# Patient Record
Sex: Male | Born: 1975 | Race: Black or African American | Hispanic: No | Marital: Single | Smoking: Current every day smoker
Health system: Southern US, Community
[De-identification: ages and names within clinical notes are randomized; demographics above are authoritative.]

## PROBLEM LIST (undated history)

## (undated) DIAGNOSIS — IMO0002 Reserved for concepts with insufficient information to code with codable children: Secondary | ICD-10-CM

## (undated) DIAGNOSIS — I10 Essential (primary) hypertension: Secondary | ICD-10-CM

## (undated) DIAGNOSIS — F419 Anxiety disorder, unspecified: Secondary | ICD-10-CM

## (undated) DIAGNOSIS — M329 Systemic lupus erythematosus, unspecified: Secondary | ICD-10-CM

## (undated) DIAGNOSIS — D649 Anemia, unspecified: Secondary | ICD-10-CM

## (undated) DIAGNOSIS — N289 Disorder of kidney and ureter, unspecified: Secondary | ICD-10-CM

## (undated) DIAGNOSIS — Z9289 Personal history of other medical treatment: Secondary | ICD-10-CM

## (undated) HISTORY — PX: AV FISTULA PLACEMENT: SHX1204

## (undated) HISTORY — PX: PERITONEAL CATHETER INSERTION: SHX2223

---

## 2018-01-18 DIAGNOSIS — I509 Heart failure, unspecified: Secondary | ICD-10-CM | POA: Insufficient documentation

## 2018-04-15 DIAGNOSIS — R519 Headache, unspecified: Secondary | ICD-10-CM | POA: Insufficient documentation

## 2019-09-01 ENCOUNTER — Emergency Department (HOSPITAL_COMMUNITY): Payer: Medicare HMO

## 2019-09-01 ENCOUNTER — Other Ambulatory Visit: Payer: Self-pay

## 2019-09-01 ENCOUNTER — Observation Stay (HOSPITAL_COMMUNITY)
Admission: EM | Admit: 2019-09-01 | Discharge: 2019-09-03 | Disposition: A | Discharge status: Home / Self Care | Payer: Medicare HMO | Attending: Internal Medicine | Admitting: Internal Medicine

## 2019-09-01 DIAGNOSIS — I132 Hypertensive heart and chronic kidney disease with heart failure and with stage 5 chronic kidney disease, or end stage renal disease: Secondary | ICD-10-CM | POA: Insufficient documentation

## 2019-09-01 DIAGNOSIS — Z20822 Contact with and (suspected) exposure to covid-19: Secondary | ICD-10-CM | POA: Insufficient documentation

## 2019-09-01 DIAGNOSIS — D649 Anemia, unspecified: Secondary | ICD-10-CM

## 2019-09-01 DIAGNOSIS — Z992 Dependence on renal dialysis: Secondary | ICD-10-CM | POA: Diagnosis not present

## 2019-09-01 DIAGNOSIS — I160 Hypertensive urgency: Secondary | ICD-10-CM | POA: Diagnosis not present

## 2019-09-01 DIAGNOSIS — R040 Epistaxis: Secondary | ICD-10-CM

## 2019-09-01 DIAGNOSIS — D631 Anemia in chronic kidney disease: Secondary | ICD-10-CM | POA: Diagnosis not present

## 2019-09-01 DIAGNOSIS — N186 End stage renal disease: Secondary | ICD-10-CM

## 2019-09-01 DIAGNOSIS — R4182 Altered mental status, unspecified: Secondary | ICD-10-CM | POA: Insufficient documentation

## 2019-09-01 DIAGNOSIS — I509 Heart failure, unspecified: Secondary | ICD-10-CM | POA: Insufficient documentation

## 2019-09-01 DIAGNOSIS — R112 Nausea with vomiting, unspecified: Principal | ICD-10-CM

## 2019-09-01 LAB — CBC WITH DIFFERENTIAL/PLATELET
Abs Immature Granulocytes: 0.06 10*3/uL (ref 0.00–0.07)
Basophils Absolute: 0 10*3/uL (ref 0.0–0.1)
Basophils Relative: 0 %
Eosinophils Absolute: 0 10*3/uL (ref 0.0–0.5)
Eosinophils Relative: 1 %
HCT: 13.3 % — ABNORMAL LOW (ref 39.0–52.0)
Hemoglobin: 4.2 g/dL — CL (ref 13.0–17.0)
Immature Granulocytes: 1 %
Lymphocytes Relative: 15 %
Lymphs Abs: 1.1 10*3/uL (ref 0.7–4.0)
MCH: 26.6 pg (ref 26.0–34.0)
MCHC: 31.6 g/dL (ref 30.0–36.0)
MCV: 84.2 fL (ref 80.0–100.0)
Monocytes Absolute: 0.5 10*3/uL (ref 0.1–1.0)
Monocytes Relative: 7 %
Neutro Abs: 5.8 10*3/uL (ref 1.7–7.7)
Neutrophils Relative %: 76 %
Platelets: 212 10*3/uL (ref 150–400)
RBC: 1.58 MIL/uL — ABNORMAL LOW (ref 4.22–5.81)
RDW: 16.1 % — ABNORMAL HIGH (ref 11.5–15.5)
WBC: 7.6 10*3/uL (ref 4.0–10.5)
nRBC: 0 % (ref 0.0–0.2)

## 2019-09-01 LAB — COMPREHENSIVE METABOLIC PANEL
ALT: 14 U/L (ref 0–44)
AST: 12 U/L — ABNORMAL LOW (ref 15–41)
Albumin: 2.5 g/dL — ABNORMAL LOW (ref 3.5–5.0)
Alkaline Phosphatase: 54 U/L (ref 38–126)
Anion gap: 18 — ABNORMAL HIGH (ref 5–15)
BUN: 81 mg/dL — ABNORMAL HIGH (ref 6–20)
CO2: 22 mmol/L (ref 22–32)
Calcium: 8 mg/dL — ABNORMAL LOW (ref 8.9–10.3)
Chloride: 100 mmol/L (ref 98–111)
Creatinine, Ser: 21.09 mg/dL — ABNORMAL HIGH (ref 0.61–1.24)
GFR calc Af Amer: 3 mL/min — ABNORMAL LOW (ref >60)
GFR calc non Af Amer: 2 mL/min — ABNORMAL LOW (ref >60)
Glucose, Bld: 88 mg/dL (ref 70–99)
Potassium: 3.9 mmol/L (ref 3.5–5.1)
Sodium: 140 mmol/L (ref 135–145)
Total Bilirubin: 0.5 mg/dL (ref 0.3–1.2)
Total Protein: 6.5 g/dL (ref 6.5–8.1)

## 2019-09-01 LAB — POC OCCULT BLOOD, ED: Fecal Occult Bld: NEGATIVE

## 2019-09-01 LAB — RETICULOCYTES
Immature Retic Fract: 7 % (ref 2.3–15.9)
RBC.: 3.12 MIL/uL — ABNORMAL LOW (ref 4.22–5.81)
Retic Count, Absolute: 49 10*3/uL (ref 19.0–186.0)
Retic Ct Pct: 1.6 % (ref 0.4–3.1)

## 2019-09-01 LAB — MAGNESIUM: Magnesium: 1.7 mg/dL (ref 1.7–2.4)

## 2019-09-01 LAB — LIPASE, BLOOD: Lipase: 37 U/L (ref 11–51)

## 2019-09-01 LAB — AMMONIA: Ammonia: 39 umol/L — ABNORMAL HIGH (ref 9–35)

## 2019-09-01 LAB — ABO/RH: ABO/RH(D): B POS

## 2019-09-01 LAB — SARS CORONAVIRUS 2 BY RT PCR (HOSPITAL ORDER, PERFORMED IN ~~LOC~~ HOSPITAL LAB): SARS Coronavirus 2: NEGATIVE

## 2019-09-01 LAB — PREPARE RBC (CROSSMATCH)

## 2019-09-01 MED ORDER — ACETAMINOPHEN 650 MG RE SUPP: 650.0000 mg | Freq: Four times a day (QID) | RECTAL | Status: DC | PRN

## 2019-09-01 MED ORDER — FENTANYL CITRATE (PF) 100 MCG/2ML IJ SOLN
50.0000 ug | Freq: Once | INTRAMUSCULAR | Status: AC
  Administered 2019-09-01: 50 ug via INTRAVENOUS
  Filled 2019-09-01: qty 2

## 2019-09-01 MED ORDER — PANTOPRAZOLE SODIUM 40 MG PO TBEC
40.0000 mg | DELAYED_RELEASE_TABLET | Freq: Every day | ORAL | Status: DC
  Administered 2019-09-02 – 2019-09-03 (×2): 40 mg via ORAL
  Filled 2019-09-01 (×2): qty 1

## 2019-09-01 MED ORDER — SODIUM CHLORIDE 0.9 % IV SOLN: 10.0000 mL/h | Freq: Once | INTRAVENOUS | Status: DC

## 2019-09-01 MED ORDER — ACETAMINOPHEN 325 MG PO TABS: 650.0000 mg | ORAL_TABLET | Freq: Four times a day (QID) | ORAL | Status: DC | PRN

## 2019-09-01 MED ORDER — CARVEDILOL 12.5 MG PO TABS
12.5000 mg | ORAL_TABLET | Freq: Two times a day (BID) | ORAL | Status: DC
  Administered 2019-09-02 – 2019-09-03 (×4): 12.5 mg via ORAL
  Filled 2019-09-01 (×3): qty 1
  Filled 2019-09-01: qty 4
  Filled 2019-09-01: qty 1

## 2019-09-01 MED ORDER — AMLODIPINE BESYLATE 5 MG PO TABS
5.0000 mg | ORAL_TABLET | Freq: Every day | ORAL | Status: DC
  Administered 2019-09-02 – 2019-09-03 (×3): 5 mg via ORAL
  Filled 2019-09-01 (×3): qty 1

## 2019-09-01 MED ORDER — ONDANSETRON 4 MG PO TBDP
4.0000 mg | ORAL_TABLET | Freq: Once | ORAL | Status: AC
  Administered 2019-09-01: 4 mg via ORAL
  Filled 2019-09-01: qty 1

## 2019-09-01 MED ORDER — CLONIDINE HCL 0.1 MG PO TABS
0.1000 mg | ORAL_TABLET | Freq: Two times a day (BID) | ORAL | Status: DC
  Administered 2019-09-02 – 2019-09-03 (×4): 0.1 mg via ORAL
  Filled 2019-09-01 (×4): qty 1

## 2019-09-01 MED ORDER — FERRIC CITRATE 1 GM 210 MG(FE) PO TABS
210.0000 mg | ORAL_TABLET | Freq: Three times a day (TID) | ORAL | Status: DC
  Administered 2019-09-02 – 2019-09-03 (×4): 210 mg via ORAL
  Filled 2019-09-01 (×5): qty 1

## 2019-09-01 MED ORDER — CALCITRIOL 0.5 MCG PO CAPS
1.5000 ug | ORAL_CAPSULE | Freq: Every day | ORAL | Status: DC
  Administered 2019-09-02 – 2019-09-03 (×2): 1.5 ug via ORAL
  Filled 2019-09-01 (×2): qty 3

## 2019-09-01 MED ORDER — ONDANSETRON HCL 4 MG/2ML IJ SOLN
4.0000 mg | Freq: Once | INTRAMUSCULAR | Status: AC
  Administered 2019-09-01: 4 mg via INTRAVENOUS
  Filled 2019-09-01: qty 2

## 2019-09-01 MED ORDER — HYDRALAZINE HCL 20 MG/ML IJ SOLN: 5.0000 mg | INTRAMUSCULAR | Status: DC | PRN

## 2019-09-01 MED ORDER — HEPARIN SODIUM (PORCINE) 5000 UNIT/ML IJ SOLN
5000.0000 [IU] | Freq: Three times a day (TID) | INTRAMUSCULAR | Status: DC
  Administered 2019-09-02: 5000 [IU] via SUBCUTANEOUS

## 2019-09-01 NOTE — H&P (Signed)
History and Physical    Nathan Castaneda YBW:[id number] DOB: [birth year] DOA: 09/01/2019  PCP: Patient, No Pcp Per Patient coming from: Home  Chief Complaint: Emesis, abdominal pain, epistaxis  HPI: Nathan Castaneda is a 44 y.o. male with medical history significant of ESRD on peritoneal dialysis, chronic anemia, hypertension, CHF, GERD presenting to the ED with complaints of emesis, abdominal pain, and epistaxis.  Patient states this morning he started having abdominal cramps.  This afternoon he ate lunch and soon after vomited what he had eaten and started having a nosebleed which lasted about 3 hours until he reached the ED.  States his abdominal cramps have now resolved and he was able to eat food in the ED. Denies prior history of nosebleeds.  Denies history of any trauma to his nose or face.  He is not on any blood thinners.  He has not been vaccinated against Covid.  Denies cough, shortness of breath, chest pain, palpitations, or lightheadedness/dizziness.  States he has not been able to take his blood pressure medications today since he has been in the ED.  States his blood pressure usually runs high and his doctor has tried adjusting the dose of his medications several times.  ED Course: Blood pressure significantly elevated with systolic above 768.  Remainder of vital signs stable.  Labs showing significant anemia with hemoglobin 4.2.  Per review of care everywhere, baseline hemoglobin in the 9-10 range.  FOBT negative.  Anemia panel pending.  Potassium 3.9, bicarb 22.  No elevation of lipase or LFTs.  Ammonia mildly elevated at 39.  SARS-CoV-2 PCR test pending.  Head CT negative for acute intracranial abnormality.  Review of Systems:  All systems reviewed and apart from history of presenting illness, are negative.  Past medical history: Listed above in HPI.  Past surgical history: Right upper extremity fistula and left upper extremity AV graft.  Social history: Smokes 1/3 pack of cigarettes  daily.  Denies ethanol or drug use.   Family history: Both mother and father with diabetes.  Allergies: Vancomycin  Prior to Admission medications   Not on File    Physical Exam: Vitals:   09/01/19 1712 09/01/19 1716 09/01/19 1721 09/01/19 2234  BP:  (!) 218/101  (!) 197/96  Pulse:  70  66  Resp:  (!) 21  18  Temp:  98.4 F (36.9 C)  98.4 F (36.9 C)  TempSrc:    Oral  SpO2: 98% 91% 98% 99%    Physical Exam Constitutional:      General: He is not in acute distress. HENT:     Head: Normocephalic and atraumatic.  Eyes:     Extraocular Movements: Extraocular movements intact.     Conjunctiva/sclera: Conjunctivae normal.  Cardiovascular:     Rate and Rhythm: Normal rate and regular rhythm.     Pulses: Normal pulses.  Pulmonary:     Effort: Pulmonary effort is normal. No respiratory distress.     Breath sounds: Normal breath sounds. No wheezing or rales.  Abdominal:     General: Bowel sounds are normal. There is no distension.     Palpations: Abdomen is soft.     Tenderness: There is no abdominal tenderness. There is no guarding or rebound.  Musculoskeletal:        General: No swelling or tenderness.     Cervical back: Normal range of motion and neck supple.  Skin:    General: Skin is warm and dry.  Neurological:     General: No focal  deficit present.     Mental Status: He is alert and oriented to person, place, and time.     Labs on Admission: I have personally reviewed following labs and imaging studies  CBC: Recent Labs  Lab 09/01/19 2022  WBC 7.6  NEUTROABS 5.8  HGB 4.2*  HCT 13.3*  MCV 84.2  PLT 751   Basic Metabolic Panel: Recent Labs  Lab 09/01/19 2022  NA 140  K 3.9  CL 100  CO2 22  GLUCOSE 88  BUN 81*  CREATININE 21.09*  CALCIUM 8.0*  MG 1.7   GFR: CrCl cannot be calculated (Unknown ideal weight.). Liver Function Tests: Recent Labs  Lab 09/01/19 2022  AST 12*  ALT 14  ALKPHOS 54  BILITOT 0.5  PROT 6.5  ALBUMIN 2.5*    Recent Labs  Lab 09/01/19 2022  LIPASE 37   Recent Labs  Lab 09/01/19 2022  AMMONIA 39*   Coagulation Profile: No results for input(s): INR, PROTIME in the last 168 hours. Cardiac Enzymes: No results for input(s): CKTOTAL, CKMB, CKMBINDEX, TROPONINI in the last 168 hours. BNP (last 3 results) No results for input(s): PROBNP in the last 8760 hours. HbA1C: No results for input(s): HGBA1C in the last 72 hours. CBG: No results for input(s): GLUCAP in the last 168 hours. Lipid Profile: No results for input(s): CHOL, HDL, LDLCALC, TRIG, CHOLHDL, LDLDIRECT in the last 72 hours. Thyroid Function Tests: No results for input(s): TSH, T4TOTAL, FREET4, T3FREE, THYROIDAB in the last 72 hours. Anemia Panel: Recent Labs    09/01/19 2157  RETICCTPCT 1.6   Urine analysis: No results found for: COLORURINE, APPEARANCEUR, LABSPEC, [place], GLUCOSEU, HGBUR, BILIRUBINUR, KETONESUR, PROTEINUR, UROBILINOGEN, NITRITE, LEUKOCYTESUR  Radiological Exams on Admission: CT Head Wo Contrast  Result Date: 09/01/2019 CLINICAL DATA:  Mental status change. Alcohol and drug use suspected. EXAM: CT HEAD WITHOUT CONTRAST TECHNIQUE: Contiguous axial images were obtained from the base of the skull through the vertex without intravenous contrast. COMPARISON:  None. FINDINGS: Brain: No intracranial hemorrhage, mass effect, or midline shift. No hydrocephalus. The basilar cisterns are patent. No evidence of territorial infarct or acute ischemia. No extra-axial or intracranial fluid collection. Vascular: No hyperdense vessel or unexpected calcification. Skull: No fracture or focal lesion. Sinuses/Orbits: Mild mucosal thickening without sinus fluid level. Mastoid air cells are clear. Slight dysconjugate gaze, typically incidental. Other: None. IMPRESSION: No acute intracranial abnormality. Electronically Signed   By: Keith Rake M.D.   On: 09/01/2019 21:08    EKG: Independently reviewed.  Sinus rhythm, LVH.  No  prior tracing for comparison.  Assessment/Plan Principal Problem:   Acute on chronic anemia Active Problems:   ESRD (end stage renal disease) (HCC)   Hypertensive urgency   Nausea & vomiting   Epistaxis   Acute on chronic anemia: Likely due to end-stage renal disease.  Also had an episode of epistaxis today.  Hemoglobin 4.2.  Per review of care everywhere, baseline hemoglobin in the 9-10 range.  FOBT negative.   -Anemia panel pending.  Type and screen.  Patient does not appear volume overloaded on exam, 2 units PRBCs ordered. Please consult nephrology in the morning.  ESRD on peritoneal dialysis: Presenting with significant anemia.  Blood pressure elevated but no signs of volume overload on exam.  Lungs clear to auscultation. Potassium 3.9, bicarb 22.  No indication for urgent dialysis at this time. -Resume home renal supplements.  Please consult nephrology in the morning.  Hypertensive urgency: Blood pressure significantly elevated in the ED with systolic  above 200. -Resume home amlodipine, Coreg, and clonidine.  IV hydralazine PRN SBP >170.  Nausea, emesis, abdominal pain: Symptoms have now resolved and patient able to tolerate p.o. intake.  Abdominal exam benign.  No fever or leukocytosis.  No elevation of lipase or LFTs. -Continue to monitor, antiemetic as needed  Epistaxis: He had an episode of epistaxis today which has now resolved.  No active bleeding at this time. -Continue to monitor closely  GERD -Resume Protonix  CHF: Does not appear volume overloaded on exam and lungs clear to auscultation. -Dialysis per nephrology  DVT prophylaxis: SCDs at this time given recent epistaxis Code Status: Full code Family Communication: No family available at this time. Disposition Plan: Status is: Observation  The patient remains OBS appropriate and will d/c before 2 midnights.  Dispo: The patient is from: Home              Anticipated d/c is to: Home              Anticipated d/c  date is: 1 day              Patient currently is not medically stable to d/c.  The medical decision making on this patient was of high complexity and the patient is at high risk for clinical deterioration, therefore this is a level 3 visit.  Shela Leff MD [facility]  If 7PM-7AM, please contact night-coverage [email or web address]  09/02/2019, 12:09 AM

## 2019-09-01 NOTE — ED Provider Notes (Signed)
[place] EMERGENCY DEPARTMENT Provider Note   CSN: [id number] Arrival date & time: 09/01/19  1703     History Chief Complaint  Patient presents with  . Emesis  . Abdominal Pain    Nathan Castaneda is a 44 y.o. male.  HPI   Patient with a significant medical history of hypertension, lupus, end-stage renal disease on peritoneal dialysis presents to the emergency department with chief complaint of nausea, vomiting, stomach cramps that started this morning.  Patient explains when he woke up he started to vomit and have severe cramping pain along his right lower abdomen.  He denies seeing blood in his vomit, fever, chills, diarrhea, dysuria.  He has never experienced this before, denies any alleviating or aggravating factors.  Patient is on peritoneal dialysis performs daily for the last 7 years, performed last treatment yesterday but was unable to complete it.  He is currently not Covid vaccine and denies any recent sick contacts.  He denies headache, fever, chills, shortness of breath, chest pain, diarrhea, dysuria, pedal edema.  No past medical history on file.  Patient Active Problem List   Diagnosis Date Noted  . Severe anemia 09/01/2019         No family history on file.  Social History   Tobacco Use  . Smoking status: Not on file  Substance Use Topics  . Alcohol use: Not on file  . Drug use: Not on file    Home Medications Prior to Admission medications   Not on File    Allergies    Patient has no allergy information on record.  Review of Systems   Review of Systems  Constitutional: Negative for chills and fever.  HENT: Negative for congestion, sneezing and sore throat.   Eyes: Negative for visual disturbance.  Respiratory: Negative for cough and shortness of breath.   Cardiovascular: Negative for chest pain.  Gastrointestinal: Positive for abdominal pain, nausea and vomiting. Negative for diarrhea.  Genitourinary: Negative for enuresis,  flank pain, frequency and scrotal swelling.  Musculoskeletal: Negative for back pain.  Skin: Negative for rash.  Neurological: Negative for dizziness and headaches.  Hematological: Does not bruise/bleed easily.    Physical Exam Updated Vital Signs BP (!) 197/96 (BP Location: Left Leg)   Pulse 66   Temp 98.4 F (36.9 C) (Oral)   Resp 18   SpO2 99%   Physical Exam Vitals and nursing note reviewed.  Constitutional:      General: He is in acute distress.     Appearance: He is not ill-appearing.  HENT:     Head: Normocephalic and atraumatic.     Nose: No congestion.     Mouth/Throat:     Mouth: Mucous membranes are moist.     Pharynx: Oropharynx is clear.  Eyes:     General: No scleral icterus. Cardiovascular:     Rate and Rhythm: Normal rate and regular rhythm.     Pulses: Normal pulses.     Heart sounds: No murmur heard.  No friction rub. No gallop.   Pulmonary:     Effort: No respiratory distress.     Breath sounds: No wheezing, rhonchi or rales.  Abdominal:     General: There is no distension.     Palpations: Abdomen is soft.     Tenderness: There is abdominal tenderness. There is no right CVA tenderness, left CVA tenderness, guarding or rebound.     Comments: Patient is abdomen was visualized, there was peritoneal dialysis tube coming from  the right lower abdomen.  No erythema, edema or signs of infection noted.  Abdomen was nondistended, normoactive bowel sounds, dull to percussion, tender to palpation along right lower quadrant, negative McBurney point, negative Murphy sign, no rebound tenderness.  Musculoskeletal:        General: No swelling.     Right lower leg: No edema.     Left lower leg: No edema.     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  Right had good thrill and pulse.  No signs of infection seen.  Skin:    General: Skin is warm and dry.     Capillary Refill: Capillary refill takes less than 2 seconds.       Findings: No rash.  Neurological:     Mental Status: He is alert and oriented to person, place, and time.  Psychiatric:        Mood and Affect: Mood normal.     ED Results / Procedures / Treatments   Labs (all labs ordered are listed, but only abnormal results are displayed) Labs Reviewed  CBC WITH DIFFERENTIAL/PLATELET - Abnormal; Notable for the following components:      Result Value   RBC 1.58 (*)    Hemoglobin 4.2 (*)    HCT 13.3 (*)    RDW 16.1 (*)    All other components within normal limits  COMPREHENSIVE METABOLIC PANEL - Abnormal; Notable for the following components:   BUN 81 (*)    Creatinine, Ser 21.09 (*)    Calcium 8.0 (*)    Albumin 2.5 (*)    AST 12 (*)    GFR calc non Af Amer 2 (*)    GFR calc Af Amer 3 (*)    Anion gap 18 (*)    All other components within normal limits  AMMONIA - Abnormal; Notable for the following components:   Ammonia 39 (*)    All other components within normal limits  RETICULOCYTES - Abnormal; Notable for the following components:   RBC. 3.12 (*)    All other components within normal limits  SARS CORONAVIRUS 2 BY RT PCR (HOSPITAL ORDER, [facility])  LIPASE, BLOOD  MAGNESIUM  VITAMIN B12  IRON AND TIBC  FERRITIN  FOLATE  POC OCCULT BLOOD, ED  PREPARE RBC (CROSSMATCH)  TYPE AND SCREEN  ABO/RH    EKG EKG Interpretation  Date/Time:  Monday September 01 2019 18:59:46 EDT Ventricular Rate:  79 PR Interval:  186 QRS Duration: 96 QT Interval:  384 QTC Calculation: 440 R Axis:   78 Text Interpretation: Normal sinus rhythm Left ventricular hypertrophy with repolarization abnormality ( Sokolow-Lyon ) Abnormal ECG No STEMI Confirmed by Nanda Quinton [id number]) on 09/01/2019 8:13:47 PM   Radiology CT Head Wo Contrast  Result Date: 09/01/2019 CLINICAL DATA:  Mental status change. Alcohol and drug use suspected. EXAM: CT HEAD WITHOUT CONTRAST TECHNIQUE: Contiguous axial images were obtained from the base  of the skull through the vertex without intravenous contrast. COMPARISON:  None. FINDINGS: Brain: No intracranial hemorrhage, mass effect, or midline shift. No hydrocephalus. The basilar cisterns are patent. No evidence of territorial infarct or acute ischemia. No extra-axial or intracranial fluid collection. Vascular: No hyperdense vessel or unexpected calcification. Skull: No fracture or focal lesion. Sinuses/Orbits: Mild mucosal thickening without sinus fluid level. Mastoid air cells are clear. Slight dysconjugate gaze, typically incidental. Other: None. IMPRESSION: No acute intracranial abnormality. Electronically Signed  By: Keith Rake M.D.   On: 09/01/2019 21:08    Procedures .Critical Care Performed by: Marcello Fennel, PA-C Authorized by: Marcello Fennel, PA-C   Critical care provider statement:    Critical care time (minutes):  35   Critical care time was exclusive of:  Separately billable procedures and treating other patients   Critical care was necessary to treat or prevent imminent or life-threatening deterioration of the following conditions: Anemia providing blood transfusion.   Critical care was time spent personally by me on the following activities:  Discussions with consultants, evaluation of patient's response to treatment, examination of patient, ordering and performing treatments and interventions, ordering and review of laboratory studies, pulse oximetry, re-evaluation of patient's condition, obtaining history from patient or surrogate and review of old charts   I assumed direction of critical care for this patient from another provider in my specialty: no     (including critical care time)  Medications Ordered in ED Medications  0.9 %  sodium chloride infusion (has no administration in time range)  fentaNYL (SUBLIMAZE) injection 50 mcg (50 mcg Intravenous Given 09/01/19 2044)  ondansetron (ZOFRAN) injection 4 mg (4 mg Intravenous Given 09/01/19 2044)    ondansetron (ZOFRAN-ODT) disintegrating tablet 4 mg (4 mg Oral Given 09/01/19 2033)    ED Course  I have reviewed the triage vital signs and the nursing notes.  Pertinent labs & imaging results that were available during my care of the patient were reviewed by me and considered in my medical decision making (see chart for details).    MDM Rules/Calculators/A&P                           I have personally reviewed all imaging, labs and have interpreted them.  Patient presents to the emergency department with chief complaint of right lower abdominal pain, nausea, vomiting.  He seemed to be in some distress, he was alert and oriented, initial vitals show hypertension and tachypnea.  He was not in respiratory distress, no nasal flaring or retractions noted, lung sounds were clear bilaterally, patient had a fistula on the right antecubital fossa good thrill and pulse, abdomen was tender to palpation in the right lower quadrant, peritoneal dialysis tube was visualized no signs of infection noted, no pedal edema seen on exam.  Will order screening labs, provide antiemetics and pain medication and reevaluate.  19:23 RN notified me that unable to obtain IV access she has consulted IV team to come place a line and draw blood work.  Patient was reevaluated- asleep does not appear to be in acute distress. Will switch patient to oral Zofran.  IV team was able to place a line and obtain labs.  Initial labs reveal CBC normocytic anemia with a hemoglobin of 4.2.  No signs of leukocytosis.  CMP shows no electrolyte abnormalities, elevated bun which appears to be at baseline for patient.  Magnesium was 1.7, lipase was 37, ammonia was 39.  Hemoccult was negative.  Will order blood transfusion as well as anemia panel work-up.  CT head came back unremarkable.  Will consult hospitalist team for further evaluation management.  23:01 spoke with Dr. Logan Bores, of the hospitalist team who states she will come and evaluate  the patient and admit the patient for further evaluation.  She requested adding another unit of blood and she will contact nephrology tomorrow morning for a consult.  I have low suspicion for GI bleed or acute intra-abdominal  abnormality requiring surgical intervention as patient is tolerating p.o. without difficulty, he denies nausea, vomiting, abdomen was reassessed nontender to palpation no acute abdomen noted on exam.  I suspect patient's normocytic anemia  secondary to his chronic kidney disease, will further evaluate with a anemia study.  Low suspicion patient will require emergent dialysis as there is no severe electrolyte abnormalities, he was nonencephalopathic, BUN appears to be at baseline for patient.  Low suspicion for cardiac abnormality as patient denies chest pain, shortness of breath, no signs of hypoperfusion or fluid overload noted on exam.  Patient care will be turned over to hospitalist team for further evaluation and management. Final Clinical Impression(s) / ED Diagnoses Final diagnoses:  Anemia due to chronic kidney disease, on chronic dialysis [facility])    Rx / DC Orders ED Discharge Orders    None       Aron Baba 09/01/19 2313    Margette Fast, MD 09/02/19 Bosie Helper

## 2019-09-01 NOTE — ED Triage Notes (Signed)
[place] from [facility] after pt c/o multiple episodes of n/v this AM. Pt also c/o of non radiating right sided abd. pain and epitaxsis. PT had peritoneal dialysis the evening prior ( 08/31/19), but according to Urgent care note pt did not finish.    B/P 218/101 HR 70 02 91% RA

## 2019-09-02 ENCOUNTER — Other Ambulatory Visit: Payer: Self-pay

## 2019-09-02 DIAGNOSIS — I160 Hypertensive urgency: Secondary | ICD-10-CM

## 2019-09-02 DIAGNOSIS — R112 Nausea with vomiting, unspecified: Secondary | ICD-10-CM

## 2019-09-02 DIAGNOSIS — N186 End stage renal disease: Secondary | ICD-10-CM

## 2019-09-02 DIAGNOSIS — R040 Epistaxis: Secondary | ICD-10-CM

## 2019-09-02 DIAGNOSIS — D649 Anemia, unspecified: Secondary | ICD-10-CM

## 2019-09-02 LAB — CBC
HCT: 25.5 % — ABNORMAL LOW (ref 39.0–52.0)
Hemoglobin: 8.3 g/dL — ABNORMAL LOW (ref 13.0–17.0)
MCH: 27.4 pg (ref 26.0–34.0)
MCHC: 32.5 g/dL (ref 30.0–36.0)
MCV: 84.2 fL (ref 80.0–100.0)
Platelets: 173 10*3/uL (ref 150–400)
RBC: 3.03 MIL/uL — ABNORMAL LOW (ref 4.22–5.81)
RDW: 15.8 % — ABNORMAL HIGH (ref 11.5–15.5)
WBC: 4.5 10*3/uL (ref 4.0–10.5)
nRBC: 0 % (ref 0.0–0.2)

## 2019-09-02 LAB — HEMOGLOBIN AND HEMATOCRIT, BLOOD
HCT: 28.7 % — ABNORMAL LOW (ref 39.0–52.0)
Hemoglobin: 9.4 g/dL — ABNORMAL LOW (ref 13.0–17.0)

## 2019-09-02 LAB — IRON AND TIBC
Iron: 21 ug/dL — ABNORMAL LOW (ref 45–182)
Saturation Ratios: 16 % — ABNORMAL LOW (ref 17.9–39.5)
TIBC: 133 ug/dL — ABNORMAL LOW (ref 250–450)
UIBC: 112 ug/dL

## 2019-09-02 LAB — FERRITIN: Ferritin: 1431 ng/mL — ABNORMAL HIGH (ref 24–336)

## 2019-09-02 LAB — FOLATE: Folate: 16.3 ng/mL (ref >5.9)

## 2019-09-02 LAB — VITAMIN B12: Vitamin B-12: 682 pg/mL (ref 180–914)

## 2019-09-02 LAB — OCCULT BLOOD X 1 CARD TO LAB, STOOL: Fecal Occult Bld: NEGATIVE

## 2019-09-02 LAB — HIV ANTIBODY (ROUTINE TESTING W REFLEX): HIV Screen 4th Generation wRfx: NONREACTIVE

## 2019-09-02 MED ORDER — HEPARIN 1000 UNIT/ML FOR PERITONEAL DIALYSIS
INTRAPERITONEAL | Status: DC | PRN
  Filled 2019-09-02 (×4): qty 5000

## 2019-09-02 MED ORDER — HEPARIN 1000 UNIT/ML FOR PERITONEAL DIALYSIS
500.0000 [IU] | INTRAMUSCULAR | Status: DC | PRN
  Filled 2019-09-02: qty 0.5

## 2019-09-02 MED ORDER — GENTAMICIN SULFATE 0.1 % EX CREA
1.0000 "application " | TOPICAL_CREAM | Freq: Every day | CUTANEOUS | Status: DC
  Administered 2019-09-02: 1 via TOPICAL
  Filled 2019-09-02: qty 15

## 2019-09-02 MED ORDER — ONDANSETRON HCL 4 MG/2ML IJ SOLN: 4.0000 mg | Freq: Four times a day (QID) | INTRAMUSCULAR | Status: DC | PRN

## 2019-09-02 MED ORDER — SODIUM CHLORIDE 0.9 % IV SOLN
510.0000 mg | Freq: Once | INTRAVENOUS | Status: AC
  Administered 2019-09-02: 510 mg via INTRAVENOUS
  Filled 2019-09-02: qty 17

## 2019-09-02 NOTE — Progress Notes (Signed)
Received report from [facility]. Room ready for patient.

## 2019-09-02 NOTE — Plan of Care (Signed)
  Problem: Education: Goal: Knowledge of General Education information will improve Description Including pain rating scale, medication(s)/side effects and non-pharmacologic comfort measures Outcome: Progressing   Problem: Health Behavior/Discharge Planning: Goal: Ability to manage health-related needs will improve Outcome: Progressing   

## 2019-09-02 NOTE — Care Management Obs Status (Signed)
[place] NOTIFICATION   Patient Details  Name: Nathan Castaneda MRN: [id number] Date of Birth: May 27, 1975   Medicare Observation Status Notification Given:  Yes    Bartholomew Crews, RN 09/02/2019, 4:08 PM

## 2019-09-02 NOTE — Progress Notes (Signed)
New Admission Note:   Arrival Method: Arrived from [facility] ED Mental Orientation: Alert and oriented x4 Telemetry: Box #9 Assessment: Completed Skin: Intact IV: NSL-RUA Pain: 0/10 Tubes: LLQ Abd Peritoneal Dialysis Catheter Safety Measures: Safety Fall Prevention Plan has been discussed.  Admission: Completed 5MW Orientation: Patient has been orientated to the room, unit and staff.  Family: None at bedside  Orders have been reviewed and implemented. Will continue to monitor the patient. Call light has been placed within reach and bed alarm has been activated.   Robina Hamor [employer], RN-BC Phone number: [phone]

## 2019-09-02 NOTE — Plan of Care (Signed)
  Problem: Nutrition: Goal: Adequate nutrition will be maintained Outcome: Progressing   

## 2019-09-02 NOTE — Progress Notes (Signed)
PROGRESS NOTE    Nathan Castaneda  HYW:[id number] DOB: 1975-08-28 DOA: 09/01/2019 PCP: Patient, No Pcp Per  Brief Narrative:Nathan Castaneda is a 43/M w/ ESRD on peritoneal dialysis, chronic anemia, hypertension, CHF, GERD presented to the ED with complaints of emesis, abdominal pain, and epistaxis.  Patient reported abdominal cramps and vomiting yesterday morning which has since resolved -He then had a prolonged period of epistaxis which lasted 3 hours, and resolved by the time he reached the ED. -ED Course: Blood pressure > 200.  Labs showing significant anemia with hemoglobin 4.2. FOBT negative.  Anemia panel pending.  Potassium 3.9, bicarb 22.  No elevation of lipase or LFTs   Assessment & Plan:   Symptomatic anemia Anemia due to acute blood loss and chronic disease -Hb 4.2 on admission, patient denies any history suggestive of overt GI bleeding -Hemoccult negative -Suspect this is secondary to prolonged episodes of epistaxis yesterday, in the background of anemia of chronic disease -Baseline hemoglobin around nine, transfusing second unit of PRBC now -Recheck Hemoccult stools -Also give IV iron, nephrology consulted, will need EPO -Repeat CBC  ESRD on peritoneal dialysis -Nephrology consulted  Hypertension -Continue amlodipine Coreg and clonidine  Nausea, vomiting and abdominal pain -Resolved since yesterday -No fever or leukocytosis -LFTs unremarkable, monitor clinically  Epistaxis -Prolonged episode yesterday, resolved  CHF -type unknown -volume managed with dialysis, FU with Cards in [place]  DVT prophylaxis: SCDs Code Status: Full Code Family Communication: No family at bedside discussed with patient in detail Disposition Plan:  Status is: Observation  The patient will require care spanning > 2 midnights and should be moved to inpatient because: Inpatient level of care appropriate due to severity of illness  Dispo: The patient is from: Home              Anticipated  d/c is to: Home              Anticipated d/c date is: 2 days              Patient currently is not medically stable to d/c.  Consultants:   Nephrology   Procedures:   Antimicrobials:    Subjective: -Feels better today, sitting in bed eating breakfast, denies any nausea vomiting or abdominal pain, did not get his peritoneal dialysis last night -Denies any melena hematemesis or hematochezia  Objective: Vitals:   09/02/19 0656 09/02/19 0712 09/02/19 0853 09/02/19 0945  BP: 121/62 121/63 (!) 176/89 (!) 144/65  Pulse: 67 67 76 65  Resp: 20 16 13 16   Temp: 98 F (36.7 C) 97.7 F (36.5 C)  98.1 F (36.7 C)  TempSrc: Oral Oral  Oral  SpO2: 99% 100% 98% 100%  Weight:      Height:        Intake/Output Summary (Last 24 hours) at 09/02/2019 1152 Last data filed at 09/02/2019 0944 Gross per 24 hour  Intake 1000 ml  Output 0 ml  Net 1000 ml   Filed Weights   09/02/19 0144  Weight: 75.5 kg    Examination:  General exam: Pleasant male sitting up in bed, awake alert oriented x3, no distress HEENT: Dried blood noted in his nostril, CVS: S1-S2, regular rate rhythm Lungs: Decreased breath sounds to bases, otherwise clear Abdomen: Soft, nontender, PD catheter noted, bowel sounds present Extremities: No edema  Skin: No rashes on exposed skin Psychiatry:  Mood & affect appropriate.     Data Reviewed:   CBC: Recent Labs  Lab 09/01/19 2022  WBC 7.6  NEUTROABS 5.8  HGB 4.2*  HCT 13.3*  MCV 84.2  PLT 585   Basic Metabolic Panel: Recent Labs  Lab 09/01/19 2022  NA 140  K 3.9  CL 100  CO2 22  GLUCOSE 88  BUN 81*  CREATININE 21.09*  CALCIUM 8.0*  MG 1.7   GFR: Estimated Creatinine Clearance: 4.7 mL/min (A) (by C-G formula based on SCr of 21.09 mg/dL (H)). Liver Function Tests: Recent Labs  Lab 09/01/19 2022  AST 12*  ALT 14  ALKPHOS 54  BILITOT 0.5  PROT 6.5  ALBUMIN 2.5*   Recent Labs  Lab 09/01/19 2022  LIPASE 37   Recent Labs  Lab  09/01/19 2022  AMMONIA 39*   Coagulation Profile: No results for input(s): INR, PROTIME in the last 168 hours. Cardiac Enzymes: No results for input(s): CKTOTAL, CKMB, CKMBINDEX, TROPONINI in the last 168 hours. BNP (last 3 results) No results for input(s): PROBNP in the last 8760 hours. HbA1C: No results for input(s): HGBA1C in the last 72 hours. CBG: No results for input(s): GLUCAP in the last 168 hours. Lipid Profile: No results for input(s): CHOL, HDL, LDLCALC, TRIG, CHOLHDL, LDLDIRECT in the last 72 hours. Thyroid Function Tests: No results for input(s): TSH, T4TOTAL, FREET4, T3FREE, THYROIDAB in the last 72 hours. Anemia Panel: Recent Labs    09/01/19 2157  VITAMINB12 682  FOLATE 16.3  FERRITIN 1,431*  TIBC 133*  IRON 21*  RETICCTPCT 1.6   Urine analysis: No results found for: COLORURINE, APPEARANCEUR, LABSPEC, PHURINE, GLUCOSEU, HGBUR, BILIRUBINUR, KETONESUR, PROTEINUR, UROBILINOGEN, NITRITE, LEUKOCYTESUR Sepsis Labs: @LABRCNTIP (procalcitonin:4,lacticidven:4)  ) Recent Results (from the past 240 hour(s))  SARS Coronavirus 2 by RT PCR (hospital order, performed in [facility] hospital lab) Nasopharyngeal Nasopharyngeal Swab     Status: None   Collection Time: 09/01/19 10:49 PM   Specimen: Nasopharyngeal Swab  Result Value Ref Range Status   SARS Coronavirus 2 NEGATIVE NEGATIVE Final    Comment: (NOTE) SARS-CoV-2 target nucleic acids are NOT DETECTED.  The SARS-CoV-2 RNA is generally detectable in upper and lower respiratory specimens during the acute phase of infection. The lowest concentration of SARS-CoV-2 viral copies this assay can detect is 250 copies / mL. A negative result does not preclude SARS-CoV-2 infection and should not be used as the sole basis for treatment or other patient management decisions.  A negative result may occur with improper specimen collection / handling, submission of specimen other than nasopharyngeal swab, presence of viral  mutation(s) within the areas targeted by this assay, and inadequate number of viral copies (<250 copies / mL). A negative result must be combined with clinical observations, patient history, and epidemiological information.  Fact Sheet for Patients:   StrictlyIdeas.no  Fact Sheet for Healthcare Providers: BankingDealers.co.za  This test is not yet approved or  cleared by the [place] FDA and has been authorized for detection and/or diagnosis of SARS-CoV-2 by FDA under an Emergency Use Authorization (EUA).  This EUA will remain in effect (meaning this test can be used) for the duration of the COVID-19 declaration under Section 564(b)(1) of the Act, 21 U.S.C. section 360bbb-3(b)(1), unless the authorization is terminated or revoked sooner.  Performed at [facility], [place] [address], [place], [place] [address]          Radiology Studies: CT Head Wo Contrast  Result Date: 09/01/2019 CLINICAL DATA:  Mental status change. Alcohol and drug use suspected. EXAM: CT HEAD WITHOUT CONTRAST TECHNIQUE: Contiguous axial images were obtained from the base  of the skull through the vertex without intravenous contrast. COMPARISON:  None. FINDINGS: Brain: No intracranial hemorrhage, mass effect, or midline shift. No hydrocephalus. The basilar cisterns are patent. No evidence of territorial infarct or acute ischemia. No extra-axial or intracranial fluid collection. Vascular: No hyperdense vessel or unexpected calcification. Skull: No fracture or focal lesion. Sinuses/Orbits: Mild mucosal thickening without sinus fluid level. Mastoid air cells are clear. Slight dysconjugate gaze, typically incidental. Other: None. IMPRESSION: No acute intracranial abnormality. Electronically Signed   By: Keith Rake M.D.   On: 09/01/2019 21:08        Scheduled Meds: . amLODipine  5 mg Oral Daily  . calcitRIOL  1.5 mcg Oral Daily  . carvedilol  12.5 mg  Oral BID WC  . cloNIDine  0.1 mg Oral BID  . ferric citrate  210 mg Oral TID WC  . pantoprazole  40 mg Oral Daily   Continuous Infusions: . sodium chloride    . ferumoxytol       LOS: 0 days    Time spent: 81mn  PDomenic Polite MD [facility]  09/02/2019, 11:52 AM

## 2019-09-03 DIAGNOSIS — D649 Anemia, unspecified: Secondary | ICD-10-CM

## 2019-09-03 DIAGNOSIS — N185 Chronic kidney disease, stage 5: Secondary | ICD-10-CM | POA: Diagnosis not present

## 2019-09-03 DIAGNOSIS — T82898A Other specified complication of vascular prosthetic devices, implants and grafts, initial encounter: Secondary | ICD-10-CM | POA: Diagnosis not present

## 2019-09-03 LAB — BPAM RBC
Blood Product Expiration Date: 202109142359
Blood Product Expiration Date: 202109152359
ISSUE DATE / TIME: 202108240233
ISSUE DATE / TIME: 202108240646
Unit Type and Rh: 7300
Unit Type and Rh: 7300

## 2019-09-03 LAB — CBC
HCT: 28 % — ABNORMAL LOW (ref 39.0–52.0)
Hemoglobin: 9.2 g/dL — ABNORMAL LOW (ref 13.0–17.0)
MCH: 27.6 pg (ref 26.0–34.0)
MCHC: 32.9 g/dL (ref 30.0–36.0)
MCV: 84.1 fL (ref 80.0–100.0)
Platelets: 160 10*3/uL (ref 150–400)
RBC: 3.33 MIL/uL — ABNORMAL LOW (ref 4.22–5.81)
RDW: 15.9 % — ABNORMAL HIGH (ref 11.5–15.5)
WBC: 4.8 10*3/uL (ref 4.0–10.5)
nRBC: 0 % (ref 0.0–0.2)

## 2019-09-03 LAB — TYPE AND SCREEN
ABO/RH(D): B POS
Antibody Screen: POSITIVE
DAT, IgG: NEGATIVE
Donor AG Type: NEGATIVE
Donor AG Type: NEGATIVE
PT AG Type: NEGATIVE
Unit division: 0
Unit division: 0

## 2019-09-03 LAB — BASIC METABOLIC PANEL
Anion gap: 14 (ref 5–15)
BUN: 80 mg/dL — ABNORMAL HIGH (ref 6–20)
CO2: 22 mmol/L (ref 22–32)
Calcium: 7.4 mg/dL — ABNORMAL LOW (ref 8.9–10.3)
Chloride: 100 mmol/L (ref 98–111)
Creatinine, Ser: 18.75 mg/dL — ABNORMAL HIGH (ref 0.61–1.24)
GFR calc Af Amer: 3 mL/min — ABNORMAL LOW (ref >60)
GFR calc non Af Amer: 3 mL/min — ABNORMAL LOW (ref >60)
Glucose, Bld: 111 mg/dL — ABNORMAL HIGH (ref 70–99)
Potassium: 3.8 mmol/L (ref 3.5–5.1)
Sodium: 136 mmol/L (ref 135–145)

## 2019-09-03 LAB — HEPATITIS B SURFACE ANTIGEN: Hepatitis B Surface Ag: NONREACTIVE

## 2019-09-03 NOTE — Consult Note (Signed)
Vascular and Vein Specialist of [facility]  Patient name: Nathan Castaneda MRN: [id number] DOB: April 08, 1975 Sex: male    HPI: Nathan Castaneda is a 44 y.o. male seen in consultation for evaluation of right arm AV access.  He reports that this was placed several years ago in [place] and it appears as though he had access of this through the medial right.  He has been on peritoneal dialysis for several years and has not used his right arm hemodialysis.  He is noted progressively enlarging area over his antecubital space.  We are consulted for evaluation of this.  He does not have any specific pain associated with this but it is making it difficult for him to bend his arm due to the large size of this mass in the antecubital space.  No past medical history on file.  No family history on file.  SOCIAL HISTORY: Social History   Tobacco Use  . Smoking status: Not on file  Substance Use Topics  . Alcohol use: Not on file    Allergies  Allergen Reactions  . Vancomycin Itching    Current Facility-Administered Medications  Medication Dose Route Frequency Provider Last Rate Last Admin  . 0.9 %  sodium chloride infusion  10 mL/hr Intravenous Once Shela Leff, MD      . acetaminophen (TYLENOL) tablet 650 mg  650 mg Oral Q6H PRN Shela Leff, MD       Or  . acetaminophen (TYLENOL) suppository 650 mg  650 mg Rectal Q6H PRN Shela Leff, MD      . amLODipine (NORVASC) tablet 5 mg  5 mg Oral Daily Shela Leff, MD   5 mg at 09/03/19 8341  . calcitRIOL (ROCALTROL) capsule 1.5 mcg  1.5 mcg Oral Daily Shela Leff, MD   1.5 mcg at 09/03/19 9622  . carvedilol (COREG) tablet 12.5 mg  12.5 mg Oral BID WC Shela Leff, MD   12.5 mg at 09/03/19 0900  . cloNIDine (CATAPRES) tablet 0.1 mg  0.1 mg Oral BID Shela Leff, MD   0.1 mg at 09/03/19 0928  . ferric citrate (AURYXIA) tablet 210 mg  210 mg Oral TID WC Shela Leff, MD    210 mg at 09/03/19 0900  . gentamicin cream (GARAMYCIN) 0.1 % 1 application  1 application Topical Daily Ernest Haber, PA-C   1 application at 29/79/89 2319  . hydrALAZINE (APRESOLINE) injection 5 mg  5 mg Intravenous Q4H PRN Shela Leff, MD      . ondansetron (ZOFRAN) injection 4 mg  4 mg Intravenous Q6H PRN Shela Leff, MD      . pantoprazole (PROTONIX) EC tablet 40 mg  40 mg Oral Daily Shela Leff, MD   40 mg at 09/03/19 2119    REVIEW OF SYSTEMS:  [X]  denotes positive finding, [ ]  denotes negative finding Cardiac  Comments:  Chest pain or chest pressure:    Shortness of breath upon exertion:    Short of breath when lying flat:    Irregular heart rhythm:        Vascular    Pain in calf, thigh, or hip brought on by ambulation:    Pain in feet at night that wakes you up from your sleep:     Blood clot in your veins:    Leg swelling:           PHYSICAL EXAM: Vitals:   09/02/19 1632 09/02/19 2109 09/02/19 2300 09/03/19 0515  BP: (!) 163/74 (!) 147/87 (!) 144/90 138/85  Pulse: 62 70 70 69  Resp: 16 18 18 18   Temp: 97.6 F (36.4 C) 98.6 F (37 C) 98.4 F (36.9 C) 98.2 F (36.8 C)  TempSrc: Oral  Oral Oral  SpO2: 99% 99% 99% 100%  Weight:  75.5 kg 77.6 kg   Height:        GENERAL: The patient is a well-nourished male, in no acute distress. The vital signs are documented above. CARDIOVASCULAR: 2+ right radial pulse.  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  The skin is intact over the false aneurysm.  There is no thrill in his prior fistula PULMONARY: There is good air exchange  MUSCULOSKELETAL: There are no major deformities or cyanosis. NEUROLOGIC: No focal weakness or paresthesias are detected. SKIN: There are no ulcers or rashes noted. PSYCHIATRIC: The patient has a normal affect.  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  There is no evidence of infection.  He is not use this access for years.  I have recommended elective repair due to the discomfort regarding the large size in his antecubital space and also for concern for continued enlarging.  We will coordinate this with him as an outpatient    Rosetta Posner, MD [facility] of [facility] Tel [phone] Pager [phone]

## 2019-09-03 NOTE — Progress Notes (Signed)
Notified by telemetry pt. is off cardiac monitor. Checked patient in room. Cardiac monitor on the floor. Patient refused to put it back on. Per patient " im nott here because of my heart. My  heart is fine. That thing is weighing me down. I cant sleep." educated patient on the importance of the cardiac monitor. Patient still refusing. On call provider made aware.

## 2019-09-03 NOTE — Discharge Summary (Addendum)
Physician Discharge Summary  Crew Holst XTK:[id number] DOB: 30-Apr-1975 DOA: 09/01/2019  PCP: Patient, No Pcp Per  Admit date: 09/01/2019 Discharge date: 09/03/2019  Admitted From: Home  Disposition:  Home  Recommendations for Outpatient Follow-up:  1. Follow up with PCP in 1-2 weeks 2. Please obtain BMP/CBC in one week 3. Follow up with vascular for aneurysmal   Home Health: none  Discharge Condition: Stable CODE STATUS: Full code Diet recommendation: Renal Diet  Brief/Interim Summary: Nathan Senioris a 43/M w/ESRD on peritoneal dialysis, chronic anemia, hypertension,CHF,GERD presented to the ED with complaints of emesis, abdominal pain, and epistaxis.Patient reported abdominal cramps and vomiting yesterday morning which has since resolved -He then had a prolonged period of epistaxis which lasted 3 hours, and resolved by the time he reached the ED. -ED Course:Blood pressure > 200. Labs showing significant anemia with hemoglobin 4.2. FOBT negative. Anemia panel pending. Potassium 3.9, bicarb 22. No elevation of lipase or LFTs.   Symptomatic anemia Anemia due to acute blood loss and chronic disease -Hb 4.2 on admission, patient denies any history suggestive of overt GI bleeding -Hemoccult negative -Suspect this is secondary to prolonged episodes of epistaxis yesterday, in the background of anemia of chronic disease -Baseline hemoglobin around nine, transfusing second unit of PRBC now -Received IV iron  -Hb stable at 9. No further bleeding.   ESRD on peritoneal dialysis -Nephrology consulted Had PD dialysis prior to discharge.   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  Evaluated by vascular. Needs to follow up out patient.   Hypertension -Continue amlodipine Coreg and clonidine  Nausea, vomiting and abdominal pain -Resolved since yesterday -No fever or leukocytosis -LFTs unremarkable, monitor clinically  Epistaxis -Prolonged episode  yesterday, resolved  CHF -type unknown -volume managed with dialysis, FU with Cards in [place]    Discharge Diagnoses:  Principal Problem:   Acute on chronic anemia Active Problems:   ESRD (end stage renal disease) ([place])   Hypertensive urgency   Nausea & vomiting   Epistaxis    Discharge Instructions  Discharge Instructions    Diet - low sodium heart healthy   Complete by: As directed    Increase activity slowly   Complete by: As directed    No wound care   Complete by: As directed      Allergies as of 09/03/2019      Reactions   Vancomycin Itching      Medication List    TAKE these medications   amLODipine 5 MG tablet Commonly known as: NORVASC Take 5 mg by mouth daily.   calcitRIOL 0.5 MCG capsule Commonly known as: ROCALTROL Take 1.5 mcg by mouth daily.   carvedilol 12.5 MG tablet Commonly known as: COREG Take 12.5 mg by mouth in the morning and at bedtime.   cloNIDine 0.1 MG tablet Commonly known as: CATAPRES Take 0.1 mg by mouth in the morning and at bedtime.   ferric citrate 1 GM 210 MG(Fe) tablet Commonly known as: AURYXIA Take 210 mg by mouth in the morning, at noon, and at bedtime.   furosemide 80 MG tablet Commonly known as: LASIX Take 80 mg by mouth daily.   pantoprazole 40 MG tablet Commonly known as: PROTONIX Take 40 mg by mouth daily.       Follow-up Information    Early, Arvilla Meres, MD Follow up in 1 week(s).   Specialties: Vascular Surgery, Cardiology Contact information: [address] [place] [address] [phone]  Allergies  Allergen Reactions  . Vancomycin Itching    Consultations: Nephrology  VasculaR  Procedures/Studies: CT Head Wo Contrast  Result Date: 09/01/2019 CLINICAL DATA:  Mental status change. Alcohol and drug use suspected. EXAM: CT HEAD WITHOUT CONTRAST TECHNIQUE: Contiguous axial images were obtained from the base of the skull through the vertex without intravenous contrast.  COMPARISON:  None. FINDINGS: Brain: No intracranial hemorrhage, mass effect, or midline shift. No hydrocephalus. The basilar cisterns are patent. No evidence of territorial infarct or acute ischemia. No extra-axial or intracranial fluid collection. Vascular: No hyperdense vessel or unexpected calcification. Skull: No fracture or focal lesion. Sinuses/Orbits: Mild mucosal thickening without sinus fluid level. Mastoid air cells are clear. Slight dysconjugate gaze, typically incidental. Other: None. IMPRESSION: No acute intracranial abnormality. Electronically Signed   By: Keith Rake M.D.   On: 09/01/2019 21:08      Subjective: Feels well , no further epistaxis.   Discharge Exam: Vitals:   09/02/19 2300 09/03/19 0515  BP: (!) 144/90 138/85  Pulse: 70 69  Resp: 18 18  Temp: 98.4 F (36.9 C) 98.2 F (36.8 C)  SpO2: 99% 100%     General: Pt is alert, awake, not in acute distress Cardiovascular: RRR, S1/S2 +, no rubs, no gallops Respiratory: CTA bilaterally, no wheezing, no rhonchi Abdominal: Soft, NT, ND, bowel sounds + Extremities: no edema, no cyanosis    The results of significant diagnostics from this hospitalization (including imaging, microbiology, ancillary and laboratory) are listed below for reference.     Microbiology: Recent Results (from the past 240 hour(s))  SARS Coronavirus 2 by RT PCR (hospital order, performed in [facility] hospital lab) Nasopharyngeal Nasopharyngeal Swab     Status: None   Collection Time: 09/01/19 10:49 PM   Specimen: Nasopharyngeal Swab  Result Value Ref Range Status   SARS Coronavirus 2 NEGATIVE NEGATIVE Final    Comment: (NOTE) SARS-CoV-2 target nucleic acids are NOT DETECTED.  The SARS-CoV-2 RNA is generally detectable in upper and lower respiratory specimens during the acute phase of infection. The lowest concentration of SARS-CoV-2 viral copies this assay can detect is 250 copies / mL. A negative result does not preclude  SARS-CoV-2 infection and should not be used as the sole basis for treatment or other patient management decisions.  A negative result may occur with improper specimen collection / handling, submission of specimen other than nasopharyngeal swab, presence of viral mutation(s) within the areas targeted by this assay, and inadequate number of viral copies (<250 copies / mL). A negative result must be combined with clinical observations, patient history, and epidemiological information.  Fact Sheet for Patients:   StrictlyIdeas.no  Fact Sheet for Healthcare Providers: BankingDealers.co.za  This test is not yet approved or  cleared by the [place] FDA and has been authorized for detection and/or diagnosis of SARS-CoV-2 by FDA under an Emergency Use Authorization (EUA).  This EUA will remain in effect (meaning this test can be used) for the duration of the COVID-19 declaration under Section 564(b)(1) of the Act, 21 U.S.C. section 360bbb-3(b)(1), unless the authorization is terminated or revoked sooner.  Performed at [facility], [place] [address], [place], [place] [address]      Labs: BNP (last 3 results) No results for input(s): BNP in the last 8760 hours. Basic Metabolic Panel: Recent Labs  Lab 09/01/19 2022 09/03/19 0659  NA 140 136  K 3.9 3.8  CL 100 100  CO2 22 22  GLUCOSE 88 111*  BUN 81*  80*  CREATININE 21.09* 18.75*  CALCIUM 8.0* 7.4*  MG 1.7  --    Liver Function Tests: Recent Labs  Lab 09/01/19 2022  AST 12*  ALT 14  ALKPHOS 54  BILITOT 0.5  PROT 6.5  ALBUMIN 2.5*   Recent Labs  Lab 09/01/19 2022  LIPASE 37   Recent Labs  Lab 09/01/19 2022  AMMONIA 39*   CBC: Recent Labs  Lab 09/01/19 2022 09/02/19 0533 09/02/19 2020 09/03/19 0659  WBC 7.6 4.5  --  4.8  NEUTROABS 5.8  --   --   --   HGB 4.2* 8.3* 9.4* 9.2*  HCT 13.3* 25.5* 28.7* 28.0*  MCV 84.2 84.2  --  84.1  PLT 212 173  --  160    Cardiac Enzymes: No results for input(s): CKTOTAL, CKMB, CKMBINDEX, TROPONINI in the last 168 hours. BNP: Invalid input(s): POCBNP CBG: No results for input(s): GLUCAP in the last 168 hours. D-Dimer No results for input(s): DDIMER in the last 72 hours. Hgb A1c No results for input(s): HGBA1C in the last 72 hours. Lipid Profile No results for input(s): CHOL, HDL, LDLCALC, TRIG, CHOLHDL, LDLDIRECT in the last 72 hours. Thyroid function studies No results for input(s): TSH, T4TOTAL, T3FREE, THYROIDAB in the last 72 hours.  Invalid input(s): FREET3 Anemia work up [employer]    09/01/19 2157  VITAMINB12 682  FOLATE 16.3  FERRITIN 1,431*  TIBC 133*  IRON 21*  RETICCTPCT 1.6   Urinalysis No results found for: COLORURINE, APPEARANCEUR, LABSPEC, [place], GLUCOSEU, [place], [place], [place], PROTEINUR, UROBILINOGEN, NITRITE, LEUKOCYTESUR Sepsis Labs Invalid input(s): PROCALCITONIN,  WBC,  LACTICIDVEN Microbiology Recent Results (from the past 240 hour(s))  SARS Coronavirus 2 by RT PCR (hospital order, performed in [facility] hospital lab) Nasopharyngeal Nasopharyngeal Swab     Status: None   Collection Time: 09/01/19 10:49 PM   Specimen: Nasopharyngeal Swab  Result Value Ref Range Status   SARS Coronavirus 2 NEGATIVE NEGATIVE Final    Comment: (NOTE) SARS-CoV-2 target nucleic acids are NOT DETECTED.  The SARS-CoV-2 RNA is generally detectable in upper and lower respiratory specimens during the acute phase of infection. The lowest concentration of SARS-CoV-2 viral copies this assay can detect is 250 copies / mL. A negative result does not preclude SARS-CoV-2 infection and should not be used as the sole basis for treatment or other patient management decisions.  A negative result may occur with improper specimen collection / handling, submission of specimen other than nasopharyngeal swab, presence of viral mutation(s) within the areas targeted by this assay, and  inadequate number of viral copies (<250 copies / mL). A negative result must be combined with clinical observations, patient history, and epidemiological information.  Fact Sheet for Patients:   StrictlyIdeas.no  Fact Sheet for Healthcare Providers: BankingDealers.co.za  This test is not yet approved or  cleared by the [place] FDA and has been authorized for detection and/or diagnosis of SARS-CoV-2 by FDA under an Emergency Use Authorization (EUA).  This EUA will remain in effect (meaning this test can be used) for the duration of the COVID-19 declaration under Section 564(b)(1) of the Act, 21 U.S.C. section 360bbb-3(b)(1), unless the authorization is terminated or revoked sooner.  Performed at [facility], [place] [address], [place], [place] [address]      Time coordinating discharge: 40 minutes  SIGNED:   Elmarie Sh[place], MD  [facility]

## 2019-09-03 NOTE — H&P (View-Only) (Signed)
[facility] and Vein Specialist of [facility]  Patient name: Nathan Castaneda MRN: [id number] DOB: 17-Jun-1975 Sex: male    HPI: Nathan Castaneda is a 44 y.o. male seen in consultation for evaluation of right arm AV access.  He reports that this was placed several years ago in [place] and it appears as though he had access of this through the medial right.  He has been on peritoneal dialysis for several years and has not used his right arm hemodialysis.  He is noted progressively enlarging area over his antecubital space.  We are consulted for evaluation of this.  He does not have any specific pain associated with this but it is making it difficult for him to bend his arm due to the large size of this mass in the antecubital space.  No past medical history on file.  No family history on file.  SOCIAL HISTORY: Social History   Tobacco Use  . Smoking status: Not on file  Substance Use Topics  . Alcohol use: Not on file    Allergies  Allergen Reactions  . Vancomycin Itching    Current Facility-Administered Medications  Medication Dose Route Frequency Provider Last Rate Last Admin  . 0.9 %  sodium chloride infusion  10 mL/hr Intravenous Once Shela Leff, MD      . acetaminophen (TYLENOL) tablet 650 mg  650 mg Oral Q6H PRN Shela Leff, MD       Or  . acetaminophen (TYLENOL) suppository 650 mg  650 mg Rectal Q6H PRN Shela Leff, MD      . amLODipine (NORVASC) tablet 5 mg  5 mg Oral Daily Shela Leff, MD   5 mg at 09/03/19 1275  . calcitRIOL (ROCALTROL) capsule 1.5 mcg  1.5 mcg Oral Daily Shela Leff, MD   1.5 mcg at 09/03/19 1700  . carvedilol (COREG) tablet 12.5 mg  12.5 mg Oral BID WC Shela Leff, MD   12.5 mg at 09/03/19 0900  . cloNIDine (CATAPRES) tablet 0.1 mg  0.1 mg Oral BID Shela Leff, MD   0.1 mg at 09/03/19 0928  . ferric citrate (AURYXIA) tablet 210 mg  210 mg Oral TID WC Shela Leff, MD    210 mg at 09/03/19 0900  . gentamicin cream (GARAMYCIN) 0.1 % 1 application  1 application Topical Daily Ernest Haber, PA-C   1 application at 17/49/44 2319  . hydrALAZINE (APRESOLINE) injection 5 mg  5 mg Intravenous Q4H PRN Shela Leff, MD      . ondansetron (ZOFRAN) injection 4 mg  4 mg Intravenous Q6H PRN Shela Leff, MD      . pantoprazole (PROTONIX) EC tablet 40 mg  40 mg Oral Daily Shela Leff, MD   40 mg at 09/03/19 9675    REVIEW OF SYSTEMS:  [X]  denotes positive finding, [ ]  denotes negative finding Cardiac  Comments:  Chest pain or chest pressure:    Shortness of breath upon exertion:    Short of breath when lying flat:    Irregular heart rhythm:        Vascular    Pain in calf, thigh, or hip brought on by ambulation:    Pain in feet at night that wakes you up from your sleep:     Blood clot in your veins:    Leg swelling:           PHYSICAL EXAM: Vitals:   09/02/19 1632 09/02/19 2109 09/02/19 2300 09/03/19 0515  BP: (!) 163/74 (!) 147/87 (!) 144/90 138/85  Pulse: 62 70 70 69  Resp: 16 18 18 18   Temp: 97.6 F (36.4 C) 98.6 F (37 C) 98.4 F (36.9 C) 98.2 F (36.8 C)  TempSrc: Oral  Oral Oral  SpO2: 99% 99% 99% 100%  Weight:  75.5 kg 77.6 kg   Height:        GENERAL: The patient is a well-nourished male, in no acute distress. The vital signs are documented above. CARDIOVASCULAR: 2+ right radial pulse.  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  The skin is intact over the false aneurysm.  There is no thrill in his prior fistula PULMONARY: There is good air exchange  MUSCULOSKELETAL: There are no major deformities or cyanosis. NEUROLOGIC: No focal weakness or paresthesias are detected. SKIN: There are no ulcers or rashes noted. PSYCHIATRIC: The patient has a normal affect.  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  There is no evidence of infection.  He is not use this access for years.  I have recommended elective repair due to the discomfort regarding the large size in his antecubital space and also for concern for continued enlarging.  We will coordinate this with him as an outpatient    Rosetta Posner, MD [employer] [facility] Specialists of [facility] Tel [phone] Pager [phone]

## 2019-09-05 ENCOUNTER — Other Ambulatory Visit: Payer: Self-pay

## 2019-09-08 ENCOUNTER — Emergency Department (HOSPITAL_COMMUNITY)
Admission: EM | Admit: 2019-09-08 | Discharge: 2019-09-09 | Disposition: A | Discharge status: Home / Self Care | Payer: Medicare HMO | Attending: Emergency Medicine | Admitting: Emergency Medicine

## 2019-09-08 ENCOUNTER — Other Ambulatory Visit: Payer: Self-pay

## 2019-09-08 ENCOUNTER — Encounter (HOSPITAL_COMMUNITY): Payer: Self-pay | Admitting: *Deleted

## 2019-09-08 DIAGNOSIS — Z79899 Other long term (current) drug therapy: Secondary | ICD-10-CM | POA: Insufficient documentation

## 2019-09-08 DIAGNOSIS — F17200 Nicotine dependence, unspecified, uncomplicated: Secondary | ICD-10-CM | POA: Insufficient documentation

## 2019-09-08 DIAGNOSIS — Z4902 Encounter for fitting and adjustment of peritoneal dialysis catheter: Secondary | ICD-10-CM | POA: Diagnosis not present

## 2019-09-08 DIAGNOSIS — I132 Hypertensive heart and chronic kidney disease with heart failure and with stage 5 chronic kidney disease, or end stage renal disease: Secondary | ICD-10-CM | POA: Diagnosis not present

## 2019-09-08 DIAGNOSIS — I509 Heart failure, unspecified: Secondary | ICD-10-CM | POA: Diagnosis not present

## 2019-09-08 DIAGNOSIS — M7989 Other specified soft tissue disorders: Secondary | ICD-10-CM | POA: Diagnosis present

## 2019-09-08 DIAGNOSIS — N186 End stage renal disease: Secondary | ICD-10-CM | POA: Diagnosis not present

## 2019-09-08 DIAGNOSIS — I770 Arteriovenous fistula, acquired: Secondary | ICD-10-CM | POA: Diagnosis not present

## 2019-09-08 HISTORY — DX: Essential (primary) hypertension: I10

## 2019-09-08 HISTORY — DX: Disorder of kidney and ureter, unspecified: N28.9

## 2019-09-08 LAB — COMPREHENSIVE METABOLIC PANEL
ALT: 14 U/L (ref 0–44)
AST: 10 U/L — ABNORMAL LOW (ref 15–41)
Albumin: 2.3 g/dL — ABNORMAL LOW (ref 3.5–5.0)
Alkaline Phosphatase: 65 U/L (ref 38–126)
Anion gap: 15 (ref 5–15)
BUN: 73 mg/dL — ABNORMAL HIGH (ref 6–20)
CO2: 22 mmol/L (ref 22–32)
Calcium: 8 mg/dL — ABNORMAL LOW (ref 8.9–10.3)
Chloride: 97 mmol/L — ABNORMAL LOW (ref 98–111)
Creatinine, Ser: 20.03 mg/dL — ABNORMAL HIGH (ref 0.61–1.24)
GFR calc Af Amer: 3 mL/min — ABNORMAL LOW (ref >60)
GFR calc non Af Amer: 2 mL/min — ABNORMAL LOW (ref >60)
Glucose, Bld: 125 mg/dL — ABNORMAL HIGH (ref 70–99)
Potassium: 4.4 mmol/L (ref 3.5–5.1)
Sodium: 134 mmol/L — ABNORMAL LOW (ref 135–145)
Total Bilirubin: 0.8 mg/dL (ref 0.3–1.2)
Total Protein: 6 g/dL — ABNORMAL LOW (ref 6.5–8.1)

## 2019-09-08 LAB — CBC WITH DIFFERENTIAL/PLATELET
Abs Immature Granulocytes: 0.07 10*3/uL (ref 0.00–0.07)
Basophils Absolute: 0 10*3/uL (ref 0.0–0.1)
Basophils Relative: 0 %
Eosinophils Absolute: 0.1 10*3/uL (ref 0.0–0.5)
Eosinophils Relative: 2 %
HCT: 30.6 % — ABNORMAL LOW (ref 39.0–52.0)
Hemoglobin: 9.5 g/dL — ABNORMAL LOW (ref 13.0–17.0)
Immature Granulocytes: 1 %
Lymphocytes Relative: 18 %
Lymphs Abs: 1.2 10*3/uL (ref 0.7–4.0)
MCH: 26.8 pg (ref 26.0–34.0)
MCHC: 31 g/dL (ref 30.0–36.0)
MCV: 86.2 fL (ref 80.0–100.0)
Monocytes Absolute: 0.5 10*3/uL (ref 0.1–1.0)
Monocytes Relative: 7 %
Neutro Abs: 5 10*3/uL (ref 1.7–7.7)
Neutrophils Relative %: 72 %
Platelets: 162 10*3/uL (ref 150–400)
RBC: 3.55 MIL/uL — ABNORMAL LOW (ref 4.22–5.81)
RDW: 16.3 % — ABNORMAL HIGH (ref 11.5–15.5)
WBC: 6.9 10*3/uL (ref 4.0–10.5)
nRBC: 0 % (ref 0.0–0.2)

## 2019-09-08 LAB — LACTIC ACID, PLASMA: Lactic Acid, Venous: 1.2 mmol/L (ref 0.5–1.9)

## 2019-09-08 NOTE — ED Triage Notes (Signed)
Pt was seen recently for abscess to right arm. Has been scheduled for vascular surgery in Sept but now had increase in swelling and pain. Pt does peritoneal dialysis at home.

## 2019-09-09 MED ORDER — HYDROMORPHONE HCL 1 MG/ML IJ SOLN
1.0000 mg | Freq: Once | INTRAMUSCULAR | Status: AC
  Administered 2019-09-09: 1 mg via INTRAMUSCULAR
  Filled 2019-09-09: qty 1

## 2019-09-09 MED ORDER — OXYCODONE-ACETAMINOPHEN 5-325 MG PO TABS: 1.0000 | ORAL_TABLET | Freq: Four times a day (QID) | ORAL | 0 refills | Status: DC | PRN

## 2019-09-09 NOTE — ED Provider Notes (Signed)
[facility] EMERGENCY DEPARTMENT Provider Note   CSN: [id number] Arrival date & time: 09/08/19  2052     History Chief Complaint  Patient presents with  . Abscess    Nathan Castaneda is a 44 y.o. male.   Nathan Castaneda is a 43 year old male with a PMH significant for ESRD on peritoneal dialysis, HTN, CHF, and acute on chronic anemia who presents with a chief complaint of a swollen right arm. He was discharged from [facility] on 09/03/19 where he was treated for symptomatic anemia and epistaxis. 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 There was no evidence of infection at that time and he is scheduled for elective repair on 09/26/19.   Patient arrives to ED today concerned for increased swelling and pain at the site of his aneurysm. He reports that the area has continued to enlarge over the last two days and is painful. He describes the pain as tightness with a burning sensation that can be present without movement. He also states that the area feels pulsatile and it is difficult to fully extend his R arm. The patient reports that when he was evaluated previously the area was only the size of a golf ball and not painful. He has tried Aleve for the pain with no relief. He sometimes feels relief when he raises his right arm above his head. Patient has not done his peritoneal dialysis today.  The history is provided by the patient. No language interpreter was used.  Abscess      Past Medical History:  Diagnosis Date  . Hypertension   . Renal disease     Patient Active Problem List   Diagnosis Date Noted  . Acute on chronic anemia 09/02/2019  . ESRD (end stage renal disease) ([place]) 09/02/2019  . Hypertensive urgency 09/02/2019  . Nausea & vomiting 09/02/2019  . Epistaxis 09/02/2019    History reviewed. No pertinent surgical history.     History reviewed. No pertinent family history.  Social  History   Tobacco Use  . Smoking status: Current Some Day Smoker  Substance Use Topics  . Alcohol use: Never  . Drug use: Never    Home Medications Prior to Admission medications   Medication Sig Start Date End Date Taking? Authorizing Provider  amLODipine (NORVASC) 5 MG tablet Take 5 mg by mouth daily. 05/14/18  Yes [provider]  calcitRIOL (ROCALTROL) 0.5 MCG capsule Take 1.5 mcg by mouth daily. 12/18/17  Yes [provider]  carvedilol (COREG) 12.5 MG tablet Take 12.5 mg by mouth in the morning and at bedtime. 06/19/18  Yes [provider]  cloNIDine (CATAPRES) 0.1 MG tablet Take 0.1 mg by mouth in the morning and at bedtime. 10/11/17  Yes [provider]  ferric citrate (AURYXIA) 1 GM 210 MG(Fe) tablet Take 210 mg by mouth in the morning, at noon, and at bedtime.   Yes [provider]  furosemide (LASIX) 80 MG tablet Take 80 mg by mouth daily. 02/04/18  Yes [provider]  ibuprofen (ADVIL) 200 MG tablet Take 400 mg by mouth every 8 (eight) hours as needed for moderate pain.   Yes [provider]  pantoprazole (PROTONIX) 40 MG tablet Take 40 mg by mouth daily. 04/17/18  Yes [provider]  oxyCODONE-acetaminophen (PERCOCET/ROXICET) 5-325 MG tablet Take 1 tablet by mouth every 6 (six) hours as needed for severe pain. 09/09/19  Antonietta Breach, PA-C    Allergies    Vancomycin  Review of Systems   Review of Systems  Ten systems reviewed and are negative for acute change, except as noted in the HPI.    Physical Exam Updated Vital Signs BP 108/65   Pulse 68   Temp 98.4 F (36.9 C) (Oral)   Resp 16   Wt 76.7 kg   SpO2 99%   BMI 24.25 kg/m   Physical Exam Vitals and nursing note reviewed.  Constitutional:      General: He is not in acute distress.    Appearance: He is well-developed. He is not diaphoretic.     Comments: Nontoxic appearing and in NAD  HENT:     Head: Normocephalic and atraumatic.  Eyes:       General: No scleral icterus.    Conjunctiva/sclera: Conjunctivae normal.  Cardiovascular:     Comments: Distal radial pulse 2+ in the RUE. Capillary refill brisk in the R hand and digits. Pulmonary:     Effort: Pulmonary effort is normal. No respiratory distress.     Comments: Respirations even and unlabored Musculoskeletal:        General: Normal range of motion.     Cervical back: Normal range of motion.     Comments: Area to right AC at site of prior AV fistula is firm. Mild erythema to overlying skin. Scan serous drainage from skin. Pulse palpable over area without thrill. Area measures ~8cm x 8cm. Distal RUE is warm and well perfused. See imaging below.  Skin:    General: Skin is warm and dry.     Coloration: Skin is not pale.     Findings: No erythema or rash.  Neurological:     Mental Status: He is alert and oriented to person, place, and time.  Psychiatric:        Behavior: Behavior normal.          ED Results / Procedures / Treatments   Labs (all labs ordered are listed, but only abnormal results are displayed) Labs Reviewed  COMPREHENSIVE METABOLIC PANEL - Abnormal; Notable for the following components:      Result Value   Sodium 134 (*)    Chloride 97 (*)    Glucose, Bld 125 (*)    BUN 73 (*)    Creatinine, Ser 20.03 (*)    Calcium 8.0 (*)    Total Protein 6.0 (*)    Albumin 2.3 (*)    AST 10 (*)    GFR calc non Af Amer 2 (*)    GFR calc Af Amer 3 (*)    All other components within normal limits  CBC WITH DIFFERENTIAL/PLATELET - Abnormal; Notable for the following components:   RBC 3.55 (*)    Hemoglobin 9.5 (*)    HCT 30.6 (*)    RDW 16.3 (*)    All other components within normal limits  LACTIC ACID, PLASMA    EKG None  Radiology No results found.  Procedures Procedures (including critical care time)  Medications Ordered in ED Medications  HYDROmorphone (DILAUDID) injection 1 mg (1 mg Intramuscular Given 09/09/19 0121)    ED Course   I have reviewed the triage vital signs and the nursing notes.  Pertinent labs & imaging results that were available during my care of the patient were reviewed by me and considered in my medical decision making (see chart for details).  Clinical Course as of Sep 09 643  Tue Sep 09, 2019  [id number] Patient presenting for assessment of aneurysm at site of prior failed AV fistula. Scheduled for repair 9/17. States that area became more painful 1.5 days ago with progressive swelling. Notes site was initially the "size of a golf ball". Pain and swelling limiting R elbow extension. Swollen to ~7x7cm, firm. Distal arm with palpable DR pulse; is well perfused and warm. Will consult [facility] for recommendations.   [KH]  0113 Spoke with Dr. Oneida Alar of [facility]. Will contact the office to see about moving up the patient's surgery. Their office will reach out to patient. Confirmed with patient that contact details in Epic are current.   [KH]    Clinical Course User Index [KH] Antonietta Breach, PA-C   MDM Rules/Calculators/A&P                          44 year old male presents to the emergency department for evaluation of an aneurysm to prior AV fistula at the right [facility].  Scheduled for surgery on 09/26/2019 with vascular surgery, but presented today complaining of worsening pain and increased swelling at aneurysm site.  Inpatient consultation note reviewed from vascular surgery.  Based on size today and physical exam from 6 days ago, suspect there has been minimal change despite patient complaints.  He was reported to have difficulty with range of motion of the elbow due to swelling.  This remains the case today as well.  Continues to have palpable distal radial pulse in the right upper extremity.  Extremity distal to the aneurysm site remains warm and well perfused.  Case discussed with Dr. Oneida Alar of vascular surgery.  Will have office contact patient in an attempt to move up scheduled procedure.  Patient to be discharged  with short course of pain medication for symptom management.  Return precautions discussed and provided. Patient discharged in stable condition with no unaddressed concerns.   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

## 2019-09-09 NOTE — Discharge Instructions (Addendum)
You will be contacted by Vascular Surgery regarding your scheduled procedure; they are attempting to move your scheduled surgery to a sooner date for repair. You may take Percocet as needed for pain. Do not drive or drink alcohol after taking this medication as it may make you drowsy and impair your judgement. Return for new or concerning symptoms.

## 2019-09-10 ENCOUNTER — Other Ambulatory Visit (HOSPITAL_COMMUNITY)
Admission: RE | Admit: 2019-09-10 | Discharge: 2019-09-10 | Disposition: A | Discharge status: Home / Self Care | Payer: Medicare HMO | Source: Ambulatory Visit | Attending: Vascular Surgery | Admitting: Vascular Surgery

## 2019-09-10 DIAGNOSIS — Z20822 Contact with and (suspected) exposure to covid-19: Secondary | ICD-10-CM | POA: Insufficient documentation

## 2019-09-10 DIAGNOSIS — Z01812 Encounter for preprocedural laboratory examination: Secondary | ICD-10-CM | POA: Diagnosis present

## 2019-09-10 LAB — SARS CORONAVIRUS 2 (TAT 6-24 HRS): SARS Coronavirus 2: NEGATIVE

## 2019-09-11 ENCOUNTER — Encounter (HOSPITAL_COMMUNITY): Payer: Self-pay | Admitting: Vascular Surgery

## 2019-09-11 ENCOUNTER — Telehealth: Payer: Self-pay

## 2019-09-11 ENCOUNTER — Other Ambulatory Visit: Payer: Self-pay

## 2019-09-11 NOTE — Progress Notes (Signed)
Nurse spoke with Izora Gala, Surgical Coordinator, to make MD aware that pt does not have a driver and caregiver post-op. Izora Gala stated that she would make MD aware and follow up with patient.

## 2019-09-11 NOTE — Telephone Encounter (Signed)
Pt is going to stay overnight after his procedure tomorrow per MD. Pt is aware and verbalized understanding.

## 2019-09-11 NOTE — Progress Notes (Signed)
Pt denies SOB, chest pain, and being under the care of a cardiologist. Pt denies having an echo and cardiac cath but stated that a stress test was performed 3-4 years ago  " it was okay."  Pt stated that he was unsure of ordering physician's name and location where the stress test was done but suggested that nurse request records from [facility] [employer]. Nurse requested D/C summary, EKG, stress , echo and all cardiac records; awaiting response. Pt denies having a chest x ray. Nurse made pt aware to stop taking  vitamins, fish oil and herbal medications. Do not take any NSAIDs ie: Ibuprofen, Advil, Naproxen (Aleve), Motrin, BC and Goody Powder. Nurse reminded pt to continue to quarantine. Pt verbalized understanding of all pre-op instructions.

## 2019-09-12 ENCOUNTER — Other Ambulatory Visit: Payer: Self-pay

## 2019-09-12 ENCOUNTER — Observation Stay (HOSPITAL_COMMUNITY)
Admission: AD | Admit: 2019-09-12 | Discharge: 2019-09-14 | Disposition: A | Discharge status: Home / Self Care | Payer: Medicare HMO | Attending: Internal Medicine | Admitting: Internal Medicine

## 2019-09-12 ENCOUNTER — Ambulatory Visit (HOSPITAL_COMMUNITY): Payer: Medicare HMO | Admitting: Anesthesiology

## 2019-09-12 ENCOUNTER — Encounter (HOSPITAL_COMMUNITY)
Admission: AD | Disposition: A | Discharge status: Home / Self Care | Payer: Self-pay | Source: Home / Self Care | Attending: Internal Medicine

## 2019-09-12 ENCOUNTER — Encounter (HOSPITAL_COMMUNITY): Payer: Self-pay | Admitting: Vascular Surgery

## 2019-09-12 DIAGNOSIS — Z992 Dependence on renal dialysis: Secondary | ICD-10-CM | POA: Diagnosis not present

## 2019-09-12 DIAGNOSIS — I120 Hypertensive chronic kidney disease with stage 5 chronic kidney disease or end stage renal disease: Secondary | ICD-10-CM | POA: Insufficient documentation

## 2019-09-12 DIAGNOSIS — N186 End stage renal disease: Secondary | ICD-10-CM | POA: Diagnosis not present

## 2019-09-12 DIAGNOSIS — I721 Aneurysm of artery of upper extremity: Principal | ICD-10-CM | POA: Insufficient documentation

## 2019-09-12 DIAGNOSIS — I729 Aneurysm of unspecified site: Secondary | ICD-10-CM | POA: Diagnosis present

## 2019-09-12 DIAGNOSIS — Z79899 Other long term (current) drug therapy: Secondary | ICD-10-CM | POA: Diagnosis not present

## 2019-09-12 DIAGNOSIS — D638 Anemia in other chronic diseases classified elsewhere: Secondary | ICD-10-CM | POA: Diagnosis present

## 2019-09-12 DIAGNOSIS — E871 Hypo-osmolality and hyponatremia: Secondary | ICD-10-CM | POA: Diagnosis present

## 2019-09-12 DIAGNOSIS — I10 Essential (primary) hypertension: Secondary | ICD-10-CM | POA: Diagnosis present

## 2019-09-12 HISTORY — DX: Reserved for concepts with insufficient information to code with codable children: IMO0002

## 2019-09-12 HISTORY — DX: Personal history of other medical treatment: Z92.89

## 2019-09-12 HISTORY — PX: VEIN HARVEST: SHX6363

## 2019-09-12 HISTORY — DX: Anemia, unspecified: D64.9

## 2019-09-12 HISTORY — PX: REVISON OF ARTERIOVENOUS FISTULA: SHX6074

## 2019-09-12 HISTORY — DX: Systemic lupus erythematosus, unspecified: M32.9

## 2019-09-12 LAB — CBC WITH DIFFERENTIAL/PLATELET
Abs Immature Granulocytes: 0.17 10*3/uL — ABNORMAL HIGH (ref 0.00–0.07)
Basophils Absolute: 0 10*3/uL (ref 0.0–0.1)
Basophils Relative: 0 %
Eosinophils Absolute: 0 10*3/uL (ref 0.0–0.5)
Eosinophils Relative: 0 %
HCT: 30.3 % — ABNORMAL LOW (ref 39.0–52.0)
Hemoglobin: 9.5 g/dL — ABNORMAL LOW (ref 13.0–17.0)
Immature Granulocytes: 2 %
Lymphocytes Relative: 7 %
Lymphs Abs: 0.6 10*3/uL — ABNORMAL LOW (ref 0.7–4.0)
MCH: 27.1 pg (ref 26.0–34.0)
MCHC: 31.4 g/dL (ref 30.0–36.0)
MCV: 86.3 fL (ref 80.0–100.0)
Monocytes Absolute: 0.3 10*3/uL (ref 0.1–1.0)
Monocytes Relative: 4 %
Neutro Abs: 7.3 10*3/uL (ref 1.7–7.7)
Neutrophils Relative %: 87 %
Platelets: 132 10*3/uL — ABNORMAL LOW (ref 150–400)
RBC: 3.51 MIL/uL — ABNORMAL LOW (ref 4.22–5.81)
RDW: 17 % — ABNORMAL HIGH (ref 11.5–15.5)
WBC: 8.4 10*3/uL (ref 4.0–10.5)
nRBC: 0.2 % (ref 0.0–0.2)

## 2019-09-12 LAB — POCT I-STAT, CHEM 8
BUN: 76 mg/dL — ABNORMAL HIGH (ref 6–20)
Calcium, Ion: 0.83 mmol/L — CL (ref 1.15–1.40)
Chloride: 100 mmol/L (ref 98–111)
Creatinine, Ser: >18 mg/dL — ABNORMAL HIGH (ref 0.61–1.24)
Glucose, Bld: 102 mg/dL — ABNORMAL HIGH (ref 70–99)
HCT: 30 % — ABNORMAL LOW (ref 39.0–52.0)
Hemoglobin: 10.2 g/dL — ABNORMAL LOW (ref 13.0–17.0)
Potassium: 4.9 mmol/L (ref 3.5–5.1)
Sodium: 133 mmol/L — ABNORMAL LOW (ref 135–145)
TCO2: 25 mmol/L (ref 22–32)

## 2019-09-12 LAB — COMPREHENSIVE METABOLIC PANEL
ALT: 9 U/L (ref 0–44)
AST: 16 U/L (ref 15–41)
Albumin: 2 g/dL — ABNORMAL LOW (ref 3.5–5.0)
Alkaline Phosphatase: 48 U/L (ref 38–126)
Anion gap: 13 (ref 5–15)
BUN: 68 mg/dL — ABNORMAL HIGH (ref 6–20)
CO2: 22 mmol/L (ref 22–32)
Calcium: 7.5 mg/dL — ABNORMAL LOW (ref 8.9–10.3)
Chloride: 96 mmol/L — ABNORMAL LOW (ref 98–111)
Creatinine, Ser: 20.05 mg/dL — ABNORMAL HIGH (ref 0.61–1.24)
GFR calc Af Amer: 3 mL/min — ABNORMAL LOW (ref >60)
GFR calc non Af Amer: 2 mL/min — ABNORMAL LOW (ref >60)
Glucose, Bld: 267 mg/dL — ABNORMAL HIGH (ref 70–99)
Potassium: 4.6 mmol/L (ref 3.5–5.1)
Sodium: 131 mmol/L — ABNORMAL LOW (ref 135–145)
Total Bilirubin: 0.4 mg/dL (ref 0.3–1.2)
Total Protein: 5.9 g/dL — ABNORMAL LOW (ref 6.5–8.1)

## 2019-09-12 LAB — MAGNESIUM: Magnesium: 1.6 mg/dL — ABNORMAL LOW (ref 1.7–2.4)

## 2019-09-12 SURGERY — REVISON OF ARTERIOVENOUS FISTULA
Anesthesia: General | Site: Leg Lower | Laterality: Right

## 2019-09-12 MED ORDER — FERRIC CITRATE 1 GM 210 MG(FE) PO TABS
630.0000 mg | ORAL_TABLET | Freq: Three times a day (TID) | ORAL | Status: DC
  Administered 2019-09-12 – 2019-09-14 (×6): 630 mg via ORAL
  Filled 2019-09-12 (×6): qty 3

## 2019-09-12 MED ORDER — LIDOCAINE HCL (CARDIAC) PF 100 MG/5ML IV SOSY
PREFILLED_SYRINGE | INTRAVENOUS | Status: DC | PRN
  Administered 2019-09-12: 60 mg via INTRAVENOUS

## 2019-09-12 MED ORDER — ACETAMINOPHEN 650 MG RE SUPP: 650.0000 mg | Freq: Four times a day (QID) | RECTAL | Status: DC | PRN

## 2019-09-12 MED ORDER — LIDOCAINE 2% (20 MG/ML) 5 ML SYRINGE
INTRAMUSCULAR | Status: AC
  Filled 2019-09-12: qty 5

## 2019-09-12 MED ORDER — OXYCODONE HCL 5 MG PO TABS
5.0000 mg | ORAL_TABLET | ORAL | Status: DC | PRN
  Administered 2019-09-12 – 2019-09-14 (×5): 5 mg via ORAL
  Filled 2019-09-12 (×5): qty 1

## 2019-09-12 MED ORDER — ACETAMINOPHEN 500 MG PO TABS
1000.0000 mg | ORAL_TABLET | Freq: Once | ORAL | Status: AC
  Administered 2019-09-12: 1000 mg via ORAL
  Filled 2019-09-12: qty 2

## 2019-09-12 MED ORDER — PROPOFOL 10 MG/ML IV BOLUS
INTRAVENOUS | Status: AC
  Filled 2019-09-12: qty 20

## 2019-09-12 MED ORDER — GENTAMICIN SULFATE 0.1 % EX CREA
1.0000 "application " | TOPICAL_CREAM | Freq: Every day | CUTANEOUS | Status: DC
  Administered 2019-09-13 – 2019-09-14 (×2): 1 via TOPICAL
  Filled 2019-09-12: qty 15

## 2019-09-12 MED ORDER — ORAL CARE MOUTH RINSE: 15.0000 mL | Freq: Once | OROMUCOSAL | Status: AC

## 2019-09-12 MED ORDER — CARVEDILOL 12.5 MG PO TABS
12.5000 mg | ORAL_TABLET | Freq: Two times a day (BID) | ORAL | Status: DC
  Administered 2019-09-12 – 2019-09-14 (×4): 12.5 mg via ORAL
  Filled 2019-09-12 (×4): qty 1

## 2019-09-12 MED ORDER — LABETALOL HCL 5 MG/ML IV SOLN
10.0000 mg | INTRAVENOUS | Status: AC | PRN
  Administered 2019-09-12 (×2): 10 mg via INTRAVENOUS
  Filled 2019-09-12: qty 4

## 2019-09-12 MED ORDER — CHLORHEXIDINE GLUCONATE 4 % EX LIQD: 60.0000 mL | Freq: Once | CUTANEOUS | Status: DC

## 2019-09-12 MED ORDER — FUROSEMIDE 80 MG PO TABS
80.0000 mg | ORAL_TABLET | Freq: Every day | ORAL | Status: DC
  Administered 2019-09-12 – 2019-09-14 (×3): 80 mg via ORAL
  Filled 2019-09-12 (×3): qty 1

## 2019-09-12 MED ORDER — HYDRALAZINE HCL 20 MG/ML IJ SOLN
10.0000 mg | Freq: Four times a day (QID) | INTRAMUSCULAR | Status: DC | PRN
  Administered 2019-09-12: 10 mg via INTRAVENOUS
  Filled 2019-09-12: qty 1

## 2019-09-12 MED ORDER — HEPARIN SODIUM (PORCINE) 1000 UNIT/ML IJ SOLN
INTRAMUSCULAR | Status: AC
  Filled 2019-09-12: qty 1

## 2019-09-12 MED ORDER — HEPARIN 1000 UNIT/ML FOR PERITONEAL DIALYSIS
INTRAPERITONEAL | Status: DC | PRN
  Filled 2019-09-12: qty 5000

## 2019-09-12 MED ORDER — DELFLEX-LC/2.5% DEXTROSE 394 MOSM/L IP SOLN: INTRAPERITONEAL | Status: DC

## 2019-09-12 MED ORDER — HEPARIN SODIUM (PORCINE) 1000 UNIT/ML IJ SOLN
INTRAMUSCULAR | Status: DC | PRN
  Administered 2019-09-12: 7000 [IU] via INTRAVENOUS

## 2019-09-12 MED ORDER — ONDANSETRON HCL 4 MG PO TABS: 4.0000 mg | ORAL_TABLET | Freq: Four times a day (QID) | ORAL | Status: DC | PRN

## 2019-09-12 MED ORDER — SODIUM CHLORIDE 0.9 % IV SOLN
INTRAVENOUS | Status: DC | PRN
  Administered 2019-09-12: 500 mL

## 2019-09-12 MED ORDER — PANTOPRAZOLE SODIUM 40 MG PO TBEC
40.0000 mg | DELAYED_RELEASE_TABLET | Freq: Every day | ORAL | Status: DC
  Administered 2019-09-12 – 2019-09-14 (×3): 40 mg via ORAL
  Filled 2019-09-12 (×3): qty 1

## 2019-09-12 MED ORDER — CEFAZOLIN SODIUM-DEXTROSE 2-4 GM/100ML-% IV SOLN
2.0000 g | INTRAVENOUS | Status: AC
  Administered 2019-09-12: 2 g via INTRAVENOUS
  Filled 2019-09-12: qty 100

## 2019-09-12 MED ORDER — ACETAMINOPHEN 325 MG PO TABS: 650.0000 mg | ORAL_TABLET | Freq: Four times a day (QID) | ORAL | Status: DC | PRN

## 2019-09-12 MED ORDER — FENTANYL CITRATE (PF) 250 MCG/5ML IJ SOLN
INTRAMUSCULAR | Status: DC | PRN
  Administered 2019-09-12: 50 ug via INTRAVENOUS

## 2019-09-12 MED ORDER — FENTANYL CITRATE (PF) 100 MCG/2ML IJ SOLN: 25.0000 ug | INTRAMUSCULAR | Status: DC | PRN

## 2019-09-12 MED ORDER — FENTANYL CITRATE (PF) 100 MCG/2ML IJ SOLN
INTRAMUSCULAR | Status: AC
  Filled 2019-09-12: qty 2

## 2019-09-12 MED ORDER — PHENYLEPHRINE HCL-NACL 10-0.9 MG/250ML-% IV SOLN
INTRAVENOUS | Status: DC | PRN
  Administered 2019-09-12: 40 ug/min via INTRAVENOUS

## 2019-09-12 MED ORDER — PROPOFOL 10 MG/ML IV BOLUS
INTRAVENOUS | Status: DC | PRN
  Administered 2019-09-12: 200 mg via INTRAVENOUS

## 2019-09-12 MED ORDER — MAGNESIUM SULFATE 2 GM/50ML IV SOLN
2.0000 g | Freq: Once | INTRAVENOUS | Status: AC
  Administered 2019-09-12: 2 g via INTRAVENOUS
  Filled 2019-09-12: qty 50

## 2019-09-12 MED ORDER — CLONIDINE HCL 0.1 MG PO TABS
0.1000 mg | ORAL_TABLET | Freq: Two times a day (BID) | ORAL | Status: DC
  Administered 2019-09-12 – 2019-09-14 (×4): 0.1 mg via ORAL
  Filled 2019-09-12 (×4): qty 1

## 2019-09-12 MED ORDER — SODIUM CHLORIDE 0.9 % IV SOLN: INTRAVENOUS | Status: DC

## 2019-09-12 MED ORDER — CALCITRIOL 0.5 MCG PO CAPS
1.5000 ug | ORAL_CAPSULE | Freq: Every day | ORAL | Status: DC
  Administered 2019-09-12 – 2019-09-14 (×3): 1.5 ug via ORAL
  Filled 2019-09-12 (×3): qty 3

## 2019-09-12 MED ORDER — DEXAMETHASONE SODIUM PHOSPHATE 10 MG/ML IJ SOLN
INTRAMUSCULAR | Status: DC | PRN
  Administered 2019-09-12: 5 mg via INTRAVENOUS

## 2019-09-12 MED ORDER — PROTAMINE SULFATE 10 MG/ML IV SOLN
INTRAVENOUS | Status: DC | PRN
  Administered 2019-09-12: 50 mg via INTRAVENOUS

## 2019-09-12 MED ORDER — HYDROMORPHONE HCL 1 MG/ML IJ SOLN: 0.5000 mg | INTRAMUSCULAR | Status: DC | PRN

## 2019-09-12 MED ORDER — CHLORHEXIDINE GLUCONATE 0.12 % MT SOLN
15.0000 mL | Freq: Once | OROMUCOSAL | Status: AC
  Administered 2019-09-12: 15 mL via OROMUCOSAL
  Filled 2019-09-12: qty 15

## 2019-09-12 MED ORDER — HEPARIN 1000 UNIT/ML FOR PERITONEAL DIALYSIS: 500.0000 [IU] | INTRAMUSCULAR | Status: DC | PRN

## 2019-09-12 MED ORDER — FERRIC CITRATE 1 GM 210 MG(FE) PO TABS: 210.0000 mg | ORAL_TABLET | Freq: Three times a day (TID) | ORAL | Status: DC

## 2019-09-12 MED ORDER — PROMETHAZINE HCL 25 MG/ML IJ SOLN: 6.2500 mg | INTRAMUSCULAR | Status: DC | PRN

## 2019-09-12 MED ORDER — PHENYLEPHRINE 40 MCG/ML (10ML) SYRINGE FOR IV PUSH (FOR BLOOD PRESSURE SUPPORT)
PREFILLED_SYRINGE | INTRAVENOUS | Status: AC
  Filled 2019-09-12: qty 10

## 2019-09-12 MED ORDER — SODIUM CHLORIDE 0.9 % IR SOLN
Status: DC | PRN
  Administered 2019-09-12: 1000 mL

## 2019-09-12 MED ORDER — HEPARIN SODIUM (PORCINE) 1000 UNIT/ML IJ SOLN
Freq: Four times a day (QID) | INTRAPERITONEAL | Status: DC
  Filled 2019-09-12 (×2): qty 5000

## 2019-09-12 MED ORDER — EPHEDRINE SULFATE-NACL 50-0.9 MG/10ML-% IV SOSY
PREFILLED_SYRINGE | INTRAVENOUS | Status: DC | PRN
  Administered 2019-09-12: 10 mg via INTRAVENOUS
  Administered 2019-09-12: 5 mg via INTRAVENOUS

## 2019-09-12 MED ORDER — FENTANYL CITRATE (PF) 250 MCG/5ML IJ SOLN
INTRAMUSCULAR | Status: AC
  Filled 2019-09-12: qty 5

## 2019-09-12 MED ORDER — ONDANSETRON HCL 4 MG/2ML IJ SOLN
INTRAMUSCULAR | Status: DC | PRN
  Administered 2019-09-12: 4 mg via INTRAVENOUS

## 2019-09-12 MED ORDER — AMLODIPINE BESYLATE 5 MG PO TABS: 5.0000 mg | ORAL_TABLET | Freq: Every day | ORAL | Status: DC

## 2019-09-12 MED ORDER — ONDANSETRON HCL 4 MG/2ML IJ SOLN: 4.0000 mg | Freq: Four times a day (QID) | INTRAMUSCULAR | Status: DC | PRN

## 2019-09-12 MED ORDER — FENTANYL CITRATE (PF) 100 MCG/2ML IJ SOLN
INTRAMUSCULAR | Status: DC | PRN
Start: 2019-09-12 — End: 2019-09-12
  Administered 2019-09-12 (×2): 25 ug via INTRAVENOUS
  Administered 2019-09-12: 50 ug via INTRAVENOUS

## 2019-09-12 MED ORDER — PHENYLEPHRINE 40 MCG/ML (10ML) SYRINGE FOR IV PUSH (FOR BLOOD PRESSURE SUPPORT)
PREFILLED_SYRINGE | INTRAVENOUS | Status: DC | PRN
  Administered 2019-09-12: 120 ug via INTRAVENOUS
  Administered 2019-09-12: 80 ug via INTRAVENOUS

## 2019-09-12 SURGICAL SUPPLY — 43 items
ARMBAND PINK RESTRICT EXTREMIT (MISCELLANEOUS) ×3 IMPLANT
BNDG ESMARK 4X9 LF (GAUZE/BANDAGES/DRESSINGS) ×3 IMPLANT
CANISTER SUCT 3000ML PPV (MISCELLANEOUS) ×3 IMPLANT
CANNULA VESSEL 3MM 2 BLNT TIP (CANNULA) ×3 IMPLANT
CATH EMB 4FR 80CM (CATHETERS) ×3 IMPLANT
CLIP LIGATING EXTRA MED SLVR (CLIP) ×3 IMPLANT
CLIP LIGATING EXTRA SM BLUE (MISCELLANEOUS) ×3 IMPLANT
COVER PROBE W GEL 5X96 (DRAPES) IMPLANT
COVER WAND RF STERILE (DRAPES) ×3 IMPLANT
CUFF TOURN SGL QUICK 18X4 (TOURNIQUET CUFF) ×3 IMPLANT
DECANTER SPIKE VIAL GLASS SM (MISCELLANEOUS) ×3 IMPLANT
DERMABOND ADVANCED (GAUZE/BANDAGES/DRESSINGS) ×1
DERMABOND ADVANCED .7 DNX12 (GAUZE/BANDAGES/DRESSINGS) ×2 IMPLANT
DRAPE CHEST BREAST 15X10 FENES (DRAPES) ×3 IMPLANT
DRAPE HALF SHEET 40X57 (DRAPES) ×3 IMPLANT
ELECT REM PT RETURN 9FT ADLT (ELECTROSURGICAL) ×3
ELECTRODE REM PT RTRN 9FT ADLT (ELECTROSURGICAL) ×2 IMPLANT
GLOVE BIOGEL PI IND STRL 6.5 (GLOVE) ×6 IMPLANT
GLOVE BIOGEL PI INDICATOR 6.5 (GLOVE) ×3
GLOVE INDICATOR 7.5 STRL GRN (GLOVE) ×3 IMPLANT
GLOVE SS BIOGEL STRL SZ 7.5 (GLOVE) ×2 IMPLANT
GLOVE SUPERSENSE BIOGEL SZ 7.5 (GLOVE) ×1
GLOVE SURG SS PI 6.5 STRL IVOR (GLOVE) ×6 IMPLANT
GLOVE SURG SS PI 7.0 STRL IVOR (GLOVE) ×3 IMPLANT
GOWN STRL REUS W/ TWL LRG LVL3 (GOWN DISPOSABLE) ×10 IMPLANT
GOWN STRL REUS W/TWL LRG LVL3 (GOWN DISPOSABLE) ×5
KIT BASIN OR (CUSTOM PROCEDURE TRAY) ×3 IMPLANT
KIT TURNOVER KIT B (KITS) ×3 IMPLANT
NS IRRIG 1000ML POUR BTL (IV SOLUTION) ×3 IMPLANT
PACK CV ACCESS (CUSTOM PROCEDURE TRAY) ×3 IMPLANT
PAD ARMBOARD 7.5X6 YLW CONV (MISCELLANEOUS) ×6 IMPLANT
PAD CAST 4YDX4 CTTN HI CHSV (CAST SUPPLIES) ×2 IMPLANT
PADDING CAST COTTON 4X4 STRL (CAST SUPPLIES) ×1
SPONGE LAP 18X18 RF (DISPOSABLE) ×3 IMPLANT
SUT ETHILON 2 0 PSLX (SUTURE) ×6 IMPLANT
SUT PROLENE 6 0 CC (SUTURE) ×9 IMPLANT
SUT VIC AB 3-0 SH 27 (SUTURE) ×1
SUT VIC AB 3-0 SH 27X BRD (SUTURE) ×2 IMPLANT
SUT VIC AB 4-0 PS2 18 (SUTURE) ×3 IMPLANT
TOWEL GREEN STERILE (TOWEL DISPOSABLE) ×3 IMPLANT
TOWEL GREEN STERILE FF (TOWEL DISPOSABLE) ×3 IMPLANT
UNDERPAD 30X36 HEAVY ABSORB (UNDERPADS AND DIAPERS) ×3 IMPLANT
WATER STERILE IRR 1000ML POUR (IV SOLUTION) ×3 IMPLANT

## 2019-09-12 NOTE — Transfer of Care (Signed)
Immediate Anesthesia Transfer of Care Note  Patient: Nathan Castaneda  Procedure(s) Performed: REPAIR OF RIGHT BRACHIAL FALSE ANEURYSM (Right Arm Upper) VEIN HARVEST (Left Leg Lower)  Patient Location: PACU  Anesthesia Type:General  Level of Consciousness: drowsy and patient cooperative  Airway & Oxygen Therapy: Patient Spontanous Breathing  Post-op Assessment: Report given to RN and Post -op Vital signs reviewed and stable  Post vital signs: Reviewed and stable  Last Vitals:  Vitals Value Taken Time  BP 154/88 09/12/19 1157  Temp    Pulse    Resp 9 09/12/19 1201  SpO2 98 1202  Vitals shown include unvalidated device data.  Last Pain:  Vitals:   09/12/19 0829  TempSrc:   PainSc: 8       Patients Stated Pain Goal: 3 (58/06/38 6854)  Complications: No complications documented.

## 2019-09-12 NOTE — Anesthesia Procedure Notes (Signed)
Procedure Name: LMA Insertion Date/Time: 09/12/2019 9:49 AM Performed by: Raenette Rover, CRNA Pre-anesthesia Checklist: Patient identified, Emergency Drugs available, Suction available and Patient being monitored Patient Re-evaluated:Patient Re-evaluated prior to induction Oxygen Delivery Method: Circle system utilized Preoxygenation: Pre-oxygenation with 100% oxygen Induction Type: IV induction LMA: LMA inserted LMA Size: 4.0 Number of attempts: 1 Placement Confirmation: positive ETCO2 and breath sounds checked- equal and bilateral Tube secured with: Tape Dental Injury: Teeth and Oropharynx as per pre-operative assessment

## 2019-09-12 NOTE — Anesthesia Preprocedure Evaluation (Signed)
Anesthesia Evaluation  Patient identified by MRN, date of birth, ID band Patient awake    Reviewed: Allergy & Precautions, NPO status , Patient's Chart, lab work & pertinent test results  Airway Mallampati: II  TM Distance: >3 FB Neck ROM: Full    Dental  (+) Dental Advisory Given   Pulmonary Current Smoker,    breath sounds clear to auscultation       Cardiovascular hypertension, Pt. on medications and Pt. on home beta blockers  Rhythm:Regular Rate:Normal     Neuro/Psych negative neurological ROS     GI/Hepatic negative GI ROS, Neg liver ROS,   Endo/Other  negative endocrine ROS  Renal/GU ESRF and DialysisRenal disease     Musculoskeletal   Abdominal   Peds  Hematology  (+) anemia ,   Anesthesia Other Findings   Reproductive/Obstetrics                             Lab Results  Component Value Date   WBC 6.9 09/08/2019   HGB 9.5 (L) 09/08/2019   HCT 30.6 (L) 09/08/2019   MCV 86.2 09/08/2019   PLT 162 09/08/2019   Lab Results  Component Value Date   CREATININE 20.03 (H) 09/08/2019   BUN 73 (H) 09/08/2019   NA 134 (L) 09/08/2019   K 4.4 09/08/2019   CL 97 (L) 09/08/2019   CO2 22 09/08/2019    Anesthesia Physical Anesthesia Plan  ASA: III  Anesthesia Plan: General   Post-op Pain Management:    Induction: Intravenous  PONV Risk Score and Plan: 1 and Dexamethasone, Ondansetron and Treatment may vary due to age or medical condition  Airway Management Planned: LMA  Additional Equipment: None  Intra-op Plan:   Post-operative Plan: Extubation in OR  Informed Consent: I have reviewed the patients History and Physical, chart, labs and discussed the procedure including the risks, benefits and alternatives for the proposed anesthesia with the patient or authorized representative who has indicated his/her understanding and acceptance.     Dental advisory given  Plan  Discussed with: CRNA  Anesthesia Plan Comments:         Anesthesia Quick Evaluation

## 2019-09-12 NOTE — Social Work (Signed)
EDCSW was called by Jari Pigg, to present Pt with Code 30. When CSW arrived in PACU, Pt was sleeping.  PACU team informed CSW that no Pt can sign a legal document until 24 hours after surgery.   Code 44 note can be given at 11 am on 9/4. CSW informed Clarise Cruz, [place].

## 2019-09-12 NOTE — Progress Notes (Addendum)
NEW ADMISSION NOTE New Admission Note:   Arrival Method:  Patient arrived in stretcher from PACU  Mental Orientation: Alert and oriented x 4.  Telemetry: 39M-15 NSR Assessment: Completed Skin: patient refused skin check.   IV: Left Hand SL. Pain: Denies any pain currently. Tubes: N/A Safety Measures: Safety Fall Prevention Plan has been given, discussed and signed Admission: Completed [facility] Orientation: Patient has been orientated to the room, unit and staff.  Family:  Orders have been reviewed and implemented. Will continue to monitor the patient. Call light has been placed within reach and bed alarm has been activated.   Amaryllis Dyke, RN

## 2019-09-12 NOTE — Progress Notes (Signed)
Elevated BP, Dr. Deatra Canter in room. Verbal orders given. Patient has no c/o of blurry vision, chest pain, headache, dizziness.

## 2019-09-12 NOTE — Anesthesia Postprocedure Evaluation (Signed)
Anesthesia Post Note  Patient: Nathan Castaneda  Procedure(s) Performed: 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 artery with reversed saphenous vein from left ankle (Right Arm Upper) SAPHENOUS VEIN HARVEST (Left Leg Lower)     Patient location during evaluation: PACU Anesthesia Type: General Level of consciousness: awake and alert Pain management: pain level controlled Vital Signs Assessment: post-procedure vital signs reviewed and stable Respiratory status: spontaneous breathing, nonlabored ventilation, respiratory function stable and patient connected to nasal cannula oxygen Cardiovascular status: blood pressure returned to baseline and stable Postop Assessment: no apparent nausea or vomiting Anesthetic complications: no   No complications documented.  Last Vitals:  Vitals:   09/12/19 1330 09/12/19 1400  BP: 115/76 (!) 159/92  Pulse: 84 90  Resp: 17 15  Temp:    SpO2: 94% 100%    Last Pain:  Vitals:   09/12/19 1400  TempSrc:   PainSc: 0-No pain                 Tiajuana Amass

## 2019-09-12 NOTE — H&P (Addendum)
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 male with medical history significant of ESRD on peritoneal dialysis, anemia of chronic disease, hypertension, GERD, tobacco abuse admitted for scheduled repair of pseudoaneurysm of right brachial artery with vascular surgery Dr. Donnetta Hutching.  Patient had right arm AV access several years ago which was placed in [place].  He had worsening swelling of right arm therefore he was evaluated by vascular surgeon and was found to have pseudoaneurysm of right brachial artery.  He underwent exploration of false aneurysm and replacement of right brachial artery with reverse saphenous vein from left ankle this morning by vascular surgery Dr. Donnetta Hutching.  [facility] consulted for admission due to multiple chronic medical problems.  Upon my evaluation: Patient resting comfortably on the bed.  He tells me that he is doing fine, denies pain, decreased range of motion or numbness tingling sensation in right arm.  He denies headache, blurry vision, chest pain, shortness of breath, palpitation, abdominal pain, nausea, vomiting, leg swelling, urinary or bowel changes.  Of note: Patient admitted on 8/23 with acute on chronic anemia secondary to epistaxis, abdominal pain, hypertensive urgency, received 2 unit PRBC and discharged home on 8/25 in stable condition.  He smokes 8 cigarettes/day, denies alcohol, illicit drug use.  He lives alone at home.  Independent on daily life activities.  Does not use walker or cane for ambulation.   Review of Systems: As per HPI otherwise negative.    Past Medical History:  Diagnosis Date  . Anemia   . History of recent blood transfusion   . Hypertension    . Lupus ([place])   . Renal disease     Past Surgical History:  Procedure Laterality Date  . AV FISTULA PLACEMENT    . PERITONEAL CATHETER INSERTION       reports that he has been smoking cigarettes. He has never used smokeless tobacco. He reports that he does not drink alcohol and does not use drugs.  Allergies  Allergen Reactions  . Vancomycin Itching    History reviewed. No pertinent family history.  Prior to Admission medications   Medication Sig Start Date End Date Taking? Authorizing Provider  amLODipine (NORVASC) 5 MG tablet Take 5 mg by mouth daily. 05/14/18  Yes [provider]  calcitRIOL (ROCALTROL) 0.5 MCG capsule Take 1.5 mcg by mouth daily. 12/18/17  Yes [provider]  carvedilol (COREG) 12.5 MG tablet Take 12.5 mg by mouth in the morning and at bedtime. 06/19/18  Yes [provider]  cloNIDine (CATAPRES) 0.1 MG tablet Take 0.1 mg by mouth in the morning and at bedtime. 10/11/17  Yes [provider]  ferric citrate (AURYXIA) 1 GM 210 MG(Fe) tablet Take 210 mg by mouth in the morning, at noon, and at bedtime.   Yes [provider]  furosemide (LASIX) 80 MG tablet Take 80 mg by mouth daily. 02/04/18  Yes [provider]  oxyCODONE-acetaminophen (PERCOCET/ROXICET) 5-325 MG tablet Take 1 tablet by mouth every 6 (six) hours as needed for severe pain. 09/09/19  Yes Antonietta Breach, PA-C  ibuprofen (ADVIL) 200 MG tablet Take 400 mg by mouth every 8 (eight) hours as needed for moderate pain.    [provider]  pantoprazole (PROTONIX) 40 MG tablet Take 40 mg by mouth daily. 04/17/18   [provider]    Physical Exam: Vitals:   09/12/19 0925 09/12/19 0940 09/12/19 1157 09/12/19 1212  BP: (!) 204/103 (!) 195/97 (!) 154/88 (!) 151/86  Pulse: 74 72 74 76  Resp:   (!) 9 11  Temp:   98.3 F (36.8 C)   TempSrc:      SpO2: 100% 100% 100% 97%  Weight:      Height:        Constitutional: NAD, calm, comfortable, on  room air, communicating well Eyes: PERRL, lids and conjunctivae normal ENMT: Mucous membranes are moist. Posterior pharynx clear of any exudate or lesions.Normal dentition.  Neck: normal, supple, no masses, no thyromegaly Respiratory: clear to auscultation bilaterally, no wheezing, no crackles. Normal respiratory effort. No accessory muscle use.  Cardiovascular: Regular rate and rhythm, no murmurs / rubs / gallops. No extremity edema. 2+ pedal pulses. No carotid bruits.  Abdomen: no tenderness, no masses palpated. No hepatosplenomegaly. Bowel sounds positive.  Dialysis catheter noted on left side of abdomen.  No signs of infection around the catheter site noted. Musculoskeletal: Right arm swollen.  Dressing intact.  No bleeding noted.  Able to move arm & hand skin: no rashes, lesions, ulcers. No induration Neurologic: CN 2-12 grossly intact. Sensation intact, DTR normal. Strength 5/5 in all 4.  Psychiatric: Normal judgment and insight. Alert and oriented x 3. Normal mood.    Labs on Admission: I have personally reviewed following labs and imaging studies  CBC: Recent Labs  Lab 09/08/19 2128 09/12/19 0908  WBC 6.9  --   NEUTROABS 5.0  --   HGB 9.5* 10.2*  HCT 30.6* 30.0*  MCV 86.2  --   PLT 162  --    Basic Metabolic Panel: Recent Labs  Lab 09/08/19 2128 09/12/19 0908  NA 134* 133*  K 4.4 4.9  CL 97* 100  CO2 22  --   GLUCOSE 125* 102*  BUN 73* 76*  CREATININE 20.03* >18.00*  CALCIUM 8.0*  --    GFR: CrCl cannot be calculated (This lab value cannot be used to calculate CrCl because it is not a number: >18.00). Liver Function Tests: Recent Labs  Lab 09/08/19 2128  AST 10*  ALT 14  ALKPHOS 65  BILITOT 0.8  PROT 6.0*  ALBUMIN 2.3*   No results for input(s): LIPASE, AMYLASE in the last 168 hours. No results for input(s): AMMONIA in the last 168 hours. Coagulation Profile: No results for input(s): INR, PROTIME in the last 168 hours. Cardiac Enzymes: No results for  input(s): CKTOTAL, CKMB, CKMBINDEX, TROPONINI in the last 168 hours. BNP (last 3 results) No results for input(s): PROBNP in the last 8760 hours. HbA1C: No results for input(s): HGBA1C in the last 72 hours. CBG: No results for input(s): GLUCAP in the last 168 hours. Lipid Profile: No results for input(s): CHOL, HDL, LDLCALC, TRIG, CHOLHDL, LDLDIRECT in the last 72 hours. Thyroid Function Tests: No results for input(s): TSH, T4TOTAL, FREET4, T3FREE, THYROIDAB in the last 72 hours. Anemia Panel: No results for input(s): VITAMINB12, FOLATE, FERRITIN, TIBC, IRON, RETICCTPCT in the last 72 hours. Urine analysis: No results found for: COLORURINE, APPEARANCEUR, LABSPEC, PHURINE, GLUCOSEU, HGBUR, BILIRUBINUR, KETONESUR, PROTEINUR, UROBILINOGEN, NITRITE, LEUKOCYTESUR  Radiological Exams on Admission: No results found.   Assessment/Plan Principal Problem:   False aneurysm of artery (HCC) Active Problems:   ESRD (end stage renal disease) (HCC)   Hypertension  Anemia of chronic disease   Hyponatremia   False aneurysm of brachial artery: -Status post exploration on false aneurysm and replacement of right brachial artery with reversed saphenous vein from left ankle.  POD#0 -Admit patient on the floor. -Monitor H&H closely. -Tylenol/oxycodone/Dilaudid as needed for mild/moderate/severe pain respectively.  ESRD on peritoneal dialysis: -Nephrology consulted. -Check CBC, BMP  Hypertension: Elevated upon arrival -Continue home meds-Coreg, amlodipine, clonidine -Hydralazine as needed for blood pressure more than 160/100.  Monitor blood pressure closely.  Anemia of chronic disease: -Check CBC.  Monitor H&H closely.  Transfuse as needed.  Hyponatremia: Chronic -Likely secondary to Lasix -Repeat BMP tomorrow a.m.  History of CHF: Unknown type.  Appears euvolemic on exam. -Reviewed documents from care everywhere.  No recent echo -Continue Lasix -Strict INO's and daily  weight.  Hypomagnesemia: Replenished.   -Repeat magnesium tomorrow a.m.  DVT prophylaxis: SCD Code Status: Full code Family Communication: None present at bedside.  Plan of care discussed with patient in length and he verbalized understanding and agreed with it. Disposition Plan: Home in 1 to 2 days Consults called: Nephrology by vascular surgery Admission status: Inpatient   Mckinley Jewel MD [facility]  If 7PM-7AM, please contact night-coverage [email or web address] Password [facility]  09/12/2019, 12:24 PM

## 2019-09-12 NOTE — Op Note (Signed)
OPERATIVE REPORT  DATE OF SURGERY: 09/12/2019  PATIENT: Nathan Castaneda, 44 y.o. male MRN: [id number]  DOB: December 17, 1975  PRE-OPERATIVE DIAGNOSIS: False aneurysm right brachial artery  POST-OPERATIVE DIAGNOSIS:  Same  PROCEDURE: 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 artery with reversed saphenous vein from left ankle  SURGEON:  Curt Jews, M.D.  PHYSICIAN ASSISTANT: Setzer PAC   The assistant was needed for exposure and to expedite the case  ANESTHESIA: General  EBL: per anesthesia record  Total I/O In: 400 [I.V.:400] Out: 15 [Blood:15]  BLOOD ADMINISTERED: none  DRAINS: none  SPECIMEN: none  COUNTS CORRECT:  YES  PATIENT DISPOSITION:  PACU - hemodynamically stable  PROCEDURE DETAILS: Patient has an unusual history.  Had history of hemodialysis via right arm in [place] in the past.  He currently is on peritoneal dialysis.  He had presented for anemia and epistaxis and nausea and vomiting approximately 7 to 10 days ago.  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  Patient reportedly been years since this has been used.  He reports that this has been slowly enlarging over time.  I had recommended elective repair mainly because of the awkwardness of having this false aneurysm in his antecubital space.  He presented with new onset of pain and erythema and his surgery was removed today.  He has palpable radial pulse.  Had complete change in physical exam since his initial evaluation by myself.  Had erythema throughout his entire arm from venous congestion and actual skin loss at the antecubital space due to the large expansion of this false aneurysm.  The right arm was prepped and draped in usual sterile fashion.  A pneumatic tourniquet was placed high in the axilla.  The arm was elevated and exsanguinated with an Esmarch tourniquet and the pneumatic tourniquet was inflated to 250 mmHg.   Incision was made through the old antecubital incision.  A large amount of chronic hematoma was evacuated.  Appears that he had had a rupture of this chronic false aneurysm.  The artery was easily seen at the base of the wound.  There was no evidence of any prosthetic tissue.  The brachial artery was exposed proximal and distal to the disrupted area and was occluded with vascular clamps.  The pneumatic tourniquet was deflated.  The patient was given 7000 units of intravenous heparin.  After further debridement did not feel it would be safe to simply place a patch.  There was no evidence of purulence but certainly concerned that this could potentially be infectious etiology and therefore did not want to place any prosthetic material.  I did harvest vein from his left ankle.  He had a very nice size saphenous vein.  The vein was ligated proximally distally with 3-0 silk ties divided.  The vein was brought onto the field.  The area of disruption was completely resected.  The brachial artery was spatulated proximally and distally.  The vein was also spatulated and was reversed and was sewn into into the proximal brachial artery with a running 6-0 Prolene suture.  This anastomosis was found to be adequate.  Next the vein was cut to the appropriate length and was spatulated and sewn into into the distal brachial artery with a running 6-0 Prolene suture.  After the usual flushing maneuvers the anastomosis was completed.  Flow was restored.  Patient did have good Doppler flow to the wrist at the  radial level.  Patient was given 50 mg of protamine reverse heparin.  There was full-thickness skin loss in an area above the antecubital space and this was resected.  The wound again was irrigated with saline and no further hematoma was present.  Nonviable tissue was resected.  The wound was closed with several 2-0 nylon mattress sutures to close all layers of skin above the vein graft.  The ankle incision was closed with 4-0  subcuticular Vicryl suture.  A Xeroform gauze, 4 x 4's Curlex and Ace wrap were placed on the right arm.   Rosetta Posner, M.D., [facility] 09/12/2019 12:02 PM

## 2019-09-12 NOTE — Interval H&P Note (Signed)
History and Physical Interval Note:  When I saw the patient in the emergency department on 09/03/2019 he had no symptoms related to this right arm false aneurysm.  He had been scheduled for elective repair.  He now has erythema and tenderness over this and the procedure has been moved up until today.  09/12/2019 9:09 AM  Nathan Castaneda  has presented today for surgery, with the diagnosis of PSEUDOANEURYSM.  The various methods of treatment have been discussed with the patient and family. After consideration of risks, benefits and other options for treatment, the patient has consented to  Procedure(s): REPAIR OF RIGHT BRACHIAL FALSE ANEURYSM (Right) as a surgical intervention.  The patient's history has been reviewed, patient examined, no change in status, stable for surgery.  I have reviewed the patient's chart and labs.  Questions were answered to the patient's satisfaction.     Curt Jews

## 2019-09-12 NOTE — Consult Note (Signed)
[facility] Renal Consultation Note    Indication for Consultation:  Management of ESRD/hemodialysis; anemia, hypertension/volume and secondary hyperparathyroidism PCP: No PCP  HPI: Nathan Castaneda is a 44 y.o. 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 PMH: Lupus, HTN,, SPHT, Tobacco abuse, multiple failed AV access. Recent admission to [facility] 08/23-08/25/2021 for abdominal pain/epistaxis. HGB 4.2 on admission S/P 2 units PRBCs 09/02/2019. Thought to be 2/2 prolonged period of epistaxis.   Patient presented today to hospital for repair of pseudo aneurysm R antecubital fossa from previous AVF creation  per Dr. Donnetta Hutching. Initially patient says pseudo aneurysm just made his arm difficult to bend but has noticed progressive swelling and pain in RUE over past three days. Seen in PACU. He is fully awake, alert, oriented X 3. Last PD treatment last PM. No issues reported with PD. He is being admitted overnight D/T issues with hypertension. Currently BP is fairly well controlled 150s/80s. Patient says his BP was too high because he was hurting.Has rec'd pain meds, feels better now.  His only complaint is of being hungery, asking for food.   Past Medical History:  Diagnosis Date  . Anemia   . History of recent blood transfusion   . Hypertension   . Lupus ([place])   . Renal disease    Past Surgical History:  Procedure Laterality Date  . AV FISTULA PLACEMENT    . PERITONEAL CATHETER INSERTION     History reviewed. No pertinent family history. Social History:  reports that he has been smoking cigarettes. He has never used smokeless tobacco. He reports that he does not drink alcohol and does not use drugs. Allergies  Allergen Reactions  . Vancomycin Itching   Prior to Admission medications   Medication Sig Start Date End Date Taking? Authorizing Provider  amLODipine (NORVASC) 5 MG tablet Take 5 mg by mouth daily. 05/14/18  Yes [provider]   calcitRIOL (ROCALTROL) 0.5 MCG capsule Take 1.5 mcg by mouth daily. 12/18/17  Yes [provider]  carvedilol (COREG) 12.5 MG tablet Take 12.5 mg by mouth in the morning and at bedtime. 06/19/18  Yes [provider]  cloNIDine (CATAPRES) 0.1 MG tablet Take 0.1 mg by mouth in the morning and at bedtime. 10/11/17  Yes [provider]  ferric citrate (AURYXIA) 1 GM 210 MG(Fe) tablet Take 210 mg by mouth in the morning, at noon, and at bedtime.   Yes [provider]  furosemide (LASIX) 80 MG tablet Take 80 mg by mouth daily. 02/04/18  Yes [provider]  oxyCODONE-acetaminophen (PERCOCET/ROXICET) 5-325 MG tablet Take 1 tablet by mouth every 6 (six) hours as needed for severe pain. 09/09/19  Yes Antonietta Breach, PA-C  ibuprofen (ADVIL) 200 MG tablet Take 400 mg by mouth every 8 (eight) hours as needed for moderate pain.    [provider]  pantoprazole (PROTONIX) 40 MG tablet Take 40 mg by mouth daily. 04/17/18   [provider]   Current Facility-Administered Medications  Medication Dose Route Frequency Provider Last Rate Last Admin  . 0.9 %  sodium chloride infusion   Intravenous Continuous Early, Arvilla Meres, MD 10 mL/hr at 09/12/19 0941 Restarted at 09/12/19 1014  . acetaminophen (TYLENOL) tablet 650 mg  650 mg Oral Q6H PRN Pahwani, Rinka R, MD       Or  . acetaminophen (TYLENOL) suppository 650 mg  650 mg Rectal Q6H PRN Pahwani, Michell Heinrich, MD      .  chlorhexidine (HIBICLENS) 4 % liquid 4 application  60 mL Topical Once Early, Arvilla Meres, MD       And  . Derrill Memo ON 09/13/2019] chlorhexidine (HIBICLENS) 4 % liquid 4 application  60 mL Topical Once Early, Arvilla Meres, MD      . fentaNYL (SUBLIMAZE) injection 25-50 mcg  25-50 mcg Intravenous Q5 min PRN Suzette Battiest, MD      . hydrALAZINE (APRESOLINE) injection 10 mg  10 mg Intravenous Q6H PRN Pahwani, Rinka R, MD      . HYDROmorphone (DILAUDID) injection 0.5-1 mg  0.5-1 mg Intravenous Q2H PRN Pahwani, Rinka  R, MD      . ondansetron (ZOFRAN) tablet 4 mg  4 mg Oral Q6H PRN Pahwani, Rinka R, MD       Or  . ondansetron (ZOFRAN) injection 4 mg  4 mg Intravenous Q6H PRN Pahwani, Rinka R, MD      . oxyCODONE (Oxy IR/ROXICODONE) immediate release tablet 5 mg  5 mg Oral Q4H PRN Pahwani, Rinka R, MD      . promethazine (PHENERGAN) injection 6.25-12.5 mg  6.25-12.5 mg Intravenous Q15 min PRN Suzette Battiest, MD       Labs: Basic Metabolic Panel: Recent Labs  Lab 09/08/19 2128 09/12/19 0908  NA 134* 133*  K 4.4 4.9  CL 97* 100  CO2 22  --   GLUCOSE 125* 102*  BUN 73* 76*  CREATININE 20.03* >18.00*  CALCIUM 8.0*  --    Liver Function Tests: Recent Labs  Lab 09/08/19 2128  AST 10*  ALT 14  ALKPHOS 65  BILITOT 0.8  PROT 6.0*  ALBUMIN 2.3*   No results for input(s): LIPASE, AMYLASE in the last 168 hours. No results for input(s): AMMONIA in the last 168 hours. CBC: Recent Labs  Lab 09/08/19 2128 09/12/19 0908  WBC 6.9  --   NEUTROABS 5.0  --   HGB 9.5* 10.2*  HCT 30.6* 30.0*  MCV 86.2  --   PLT 162  --    Cardiac Enzymes: No results for input(s): CKTOTAL, CKMB, CKMBINDEX, TROPONINI in the last 168 hours. CBG: No results for input(s): GLUCAP in the last 168 hours. Iron Studies: No results for input(s): IRON, TIBC, TRANSFERRIN, FERRITIN in the last 72 hours. Studies/Results: No results found.  ROS: As per HPI otherwise negative.  Physical Exam: Vitals:   09/12/19 1212 09/12/19 1227 09/12/19 1245 09/12/19 1300  BP: (!) 151/86 (!) 142/81 (!) 165/97 (!) 152/96  Pulse: 76 73 73 72  Resp: 11 11 10 12   Temp:    98.2 F (36.8 C)  TempSrc:      SpO2: 97% 97% 97% 97%  Weight:      Height:         General: Well developed, well nourished, in no acute distress. Head: Normocephalic, atraumatic, sclera non-icteric, mucus membranes are moist Neck: Supple. JVD not elevated. Lungs: Clear bilaterally to auscultation without wheezes, rales, or rhonchi. Breathing is  unlabored. Heart: RRR with S1 S2. No murmurs, rubs, or gallops appreciated. Abdomen: Soft, non-tender, non-distended with normoactive bowel sounds. No rebound/guarding. No obvious abdominal masses. M-S:  Strength and tone appear normal for age. Lower extremities: No LE edema. RUE edema in R hand.  Small incision L ankle-vein harvest site.  Neuro: Alert and oriented X 3. Moves all extremities spontaneously. Psych:  Responds to questions appropriately with a normal affect. Dialysis Access: PD catheter LLQ, RUA AVF Acewrap in place.   Dialysis Orders: Center: [facility]. [facility]  7 X weekly. 75 kg Uses 2.5% . 5 exchanges, fill volume 2800 ml, dwell time 1 hour 40 minutes. Last fill 1100 ml.    Assessment/Plan: 1.  Pseudoaneurysm R ACF S/P exploration of pseudoaneurysm, replacement of of R brachial artery with reversed saphenous vein from L ankle. Pain now controlled in PACU. Per [facility]. 2.  ESRD - Continue CCPD per home orders. Says he uses 2.5% all exchanges. SCr noted to be 18-20 08/25-09/03/2019. Does 5 exchanges.  3.  Hypertension/volume -Volume seems OK. BP elevated on arrival to PACU, now with better control after receiving pain meds.. Carvedilol 12.5 mg PO BID, amlodipine 10 mg PO q HS and Clonidine 0.1 mg PO BID on OP med list. All have been resumed, amlodipine is at lower dose than OP list.  4.  Anemia  - HGB 10.2 Follow trend.  5.  Metabolic bone disease -  Appears to have issues with elevated PO4 and PTH as OP. Continue VDRA and binders.  6.  Nutrition - Albumin was 2.3 last admission. Appears to be chronic issue for this pt. Add protein supps.  7. H/O Lupus  Camila Norville H. Owens Shark, NP-C 09/12/2019, 1:31 PM  [employer] [phone]

## 2019-09-13 ENCOUNTER — Encounter (HOSPITAL_COMMUNITY): Payer: Self-pay | Admitting: Vascular Surgery

## 2019-09-13 DIAGNOSIS — I729 Aneurysm of unspecified site: Secondary | ICD-10-CM

## 2019-09-13 DIAGNOSIS — I721 Aneurysm of artery of upper extremity: Secondary | ICD-10-CM | POA: Diagnosis not present

## 2019-09-13 LAB — CBC
HCT: 28.1 % — ABNORMAL LOW (ref 39.0–52.0)
Hemoglobin: 9 g/dL — ABNORMAL LOW (ref 13.0–17.0)
MCH: 27.5 pg (ref 26.0–34.0)
MCHC: 32 g/dL (ref 30.0–36.0)
MCV: 85.9 fL (ref 80.0–100.0)
Platelets: 128 10*3/uL — ABNORMAL LOW (ref 150–400)
RBC: 3.27 MIL/uL — ABNORMAL LOW (ref 4.22–5.81)
RDW: 17.3 % — ABNORMAL HIGH (ref 11.5–15.5)
WBC: 8 10*3/uL (ref 4.0–10.5)
nRBC: 0.4 % — ABNORMAL HIGH (ref 0.0–0.2)

## 2019-09-13 LAB — BASIC METABOLIC PANEL
Anion gap: 12 (ref 5–15)
BUN: 66 mg/dL — ABNORMAL HIGH (ref 6–20)
CO2: 22 mmol/L (ref 22–32)
Calcium: 7.5 mg/dL — ABNORMAL LOW (ref 8.9–10.3)
Chloride: 97 mmol/L — ABNORMAL LOW (ref 98–111)
Creatinine, Ser: 18.53 mg/dL — ABNORMAL HIGH (ref 0.61–1.24)
GFR calc Af Amer: 3 mL/min — ABNORMAL LOW (ref >60)
GFR calc non Af Amer: 3 mL/min — ABNORMAL LOW (ref >60)
Glucose, Bld: 124 mg/dL — ABNORMAL HIGH (ref 70–99)
Potassium: 4.7 mmol/L (ref 3.5–5.1)
Sodium: 131 mmol/L — ABNORMAL LOW (ref 135–145)

## 2019-09-13 LAB — MAGNESIUM: Magnesium: 2.1 mg/dL (ref 1.7–2.4)

## 2019-09-13 MED ORDER — AMLODIPINE BESYLATE 10 MG PO TABS
10.0000 mg | ORAL_TABLET | Freq: Every day | ORAL | Status: DC
  Administered 2019-09-13 – 2019-09-14 (×2): 10 mg via ORAL
  Filled 2019-09-13 (×2): qty 1

## 2019-09-13 NOTE — Progress Notes (Signed)
Subjective: Tolerating PD last night but needs 1.5 hours dwell .  Arm pain controlled with meds  Objective Vital signs in last 24 hours: Vitals:   09/12/19 2210 09/13/19 0608 09/13/19 0929 09/13/19 0957  BP: (!) 148/97 (!) 142/87 (!) 174/111 (!) 150/93  Pulse: 74 69 72 66  Resp:  16 16 16   Temp: 98.1 F (36.7 C)  98.2 F (36.8 C) 97.8 F (36.6 C)  TempSrc: Oral  Oral Oral  SpO2: 100% 99% 99% 99%  Weight:   76 kg   Height:       Weight change:   Physical Exam: General: Alert watching TV appropriate Heart: RRR, no MRG Lungs: CTA nonlabored Abdomen: BS+, soft, NT ND PD catheter in place Extremities: No pedal edema Dialysis Access: Right upper arm AV fistula surgical history right vaccine in place, no bruit  OP PD dialysis Orders: Center: [facility]. CCPD 7 X weekly. 75 kg Uses 2.5% . 5 exchanges, fill volume 2800 ml, dwell time 1 hour 40 minutes. Last fill 1100 ml.    Problem/Plan 1.  Pseudoaneurysm R ACF S/P exploration of pseudoaneurysm, replacement of of R brachial artery with reversed saphenous vein from L ankle. Plan for VVS  2. ESRD - Continue CCPD per home orders. Says he uses 2.5% all exchanges. SCr noted to be 18-20 08/25-09/03/2019. Does 5 exchanges.  This a.m.   K4.7, BUN 66, CR 18.512  3.  Hypertension/volume -Volume seems OK. BP elevated on arrival to PACU, improving with meds and with better control after receiving pain meds.. Use 2.5 PD ,  carvedilol 12.5 mg PO BID, amlodipine 10 mg PO q HS and Clonidine 0.1 mg PO BID on OP med list. All have been resumed,   4.  Anemia  - HGB 10.2 >9.0 Follow trend.  5.  Metabolic bone disease -  Appears to have issues with elevated PO4 and PTH as OP. Continue VDRA and binders.  6.  Nutrition - Albumin 2.0 . Add protein supps.  7. H/O Lupus   Ernest Haber, PA-C [place] [phone] 09/13/2019,12:25 PM  LOS: 1 day   Labs: Basic Metabolic Panel: Recent Labs  Lab 09/08/19 2128  09/08/19 2128 09/12/19 0908 09/12/19 1442 09/13/19 0429  NA 134*   < > 133* 131* 131*  K 4.4   < > 4.9 4.6 4.7  CL 97*   < > 100 96* 97*  CO2 22  --   --  22 22  GLUCOSE 125*   < > 102* 267* 124*  BUN 73*   < > 76* 68* 66*  CREATININE 20.03*   < > >18.00* 20.05* 18.53*  CALCIUM 8.0*  --   --  7.5* 7.5*   < > = values in this interval not displayed.   Liver Function Tests: Recent Labs  Lab 09/08/19 2128 09/12/19 1442  AST 10* 16  ALT 14 9  ALKPHOS 65 48  BILITOT 0.8 0.4  PROT 6.0* 5.9*  ALBUMIN 2.3* 2.0*   No results for input(s): LIPASE, AMYLASE in the last 168 hours. No results for input(s): AMMONIA in the last 168 hours. CBC: Recent Labs  Lab 09/08/19 2128 09/08/19 2128 09/12/19 0908 09/12/19 1442 09/13/19 0429  WBC 6.9  --   --  8.4 8.0  NEUTROABS 5.0  --   --  7.3  --   HGB 9.5*   < > 10.2* 9.5* 9.0*  HCT 30.6*   < > 30.0* 30.3* 28.1*  MCV 86.2  --   --  86.3 85.9  PLT 162  --   --  132* 128*   < > = values in this interval not displayed.   Cardiac Enzymes: No results for input(s): CKTOTAL, CKMB, CKMBINDEX, TROPONINI in the last 168 hours. CBG: No results for input(s): GLUCAP in the last 168 hours.  Studies/Results: No results found. Medications: . dialysis solution 2.5% low-MG/low-CA     . amLODipine  10 mg Oral Daily  . calcitRIOL  1.5 mcg Oral Daily  . carvedilol  12.5 mg Oral BID WC  . cloNIDine  0.1 mg Oral BID  . ferric citrate  630 mg Oral TID WC  . furosemide  80 mg Oral Daily  . gentamicin cream  1 application Topical Daily  . pantoprazole  40 mg Oral Daily

## 2019-09-13 NOTE — Care Management Obs Status (Signed)
[place] NOTIFICATION   Patient Details  Name: Nathan Castaneda MRN: [id number] Date of Birth: 07-29-1975   Medicare Observation Status Notification Given:  Yes    Bartholomew Crews, RN 09/13/2019, 11:23 AM

## 2019-09-13 NOTE — Progress Notes (Signed)
PROGRESS NOTE    Cote Mayabb Machuca  GLO:[id number] DOB: [birth year] DOA: 09/12/2019 PCP: Patient, No Pcp Per  Brief Narrative: Lora Paula Holcomb is a 44 y.o. male with medical history significant of ESRD on peritoneal dialysis, anemia of chronic disease, hypertension, GERD, tobacco abuse admitted for elective repair of pseudoaneurysm of right brachial artery with vascular surgery Dr. Donnetta Hutching. Patient had right arm AV access several years ago which was placed in [place].  He had worsening swelling of right arm therefore he was evaluated by vascular surgeon and was found to have pseudoaneurysm of right brachial artery.  He underwent exploration of false aneurysm and replacement of right brachial artery with reverse saphenous vein from left ankle 9/3 am by vascular surgery Dr. Donnetta Hutching.  [facility] hospitalist consulted for admission due to multiple chronic medical problems.   Assessment & Plan:  Pseudoaneurysm of brachial artery: -Status post exploration and right brachial interposition graft by Dr. Donnetta Hutching 9/3 -Pain control -Wound care, arm elevation -Discharge home when cleared by vascular surgery  ESRD on peritoneal dialysis: -Nephrology consulted. -Continue PD  Hypertension: -Continue home meds-Coreg, amlodipine, clonidine -Hydralazine PRN  Anemia of chronic disease: -Hemoglobin stable, monitor  Hyponatremia: Chronic -Stable.  History of CHF: Unknown type.  Appears euvolemic on exam. -Reviewed documents from care everywhere.  No recent echo -Continue Lasix and fluid removal with PD  Hypomagnesemia: Replenished.   -Repeat magnesium tomorrow a.m.  DVT prophylaxis: SCD Code Status: Full code Family Communication: None present at bedside.   Disposition Plan:  Status is: Observation Status post pseudoaneurysm repair plan for discharge home once cleared by vascular surgery Dispo: The patient is from: Home              Anticipated d/c is to: Home              Anticipated d/c date  is: 1-2              Patient currently is not medically stable to d/c.  Consultants:   VVS   Procedures: PROCEDURE: 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 artery with reversed saphenous vein from left ankle 9/3  Antimicrobials:    Subjective: -Feels okay, no complaints  Objective: Vitals:   09/12/19 2210 09/13/19 0608 09/13/19 0929 09/13/19 0957  BP: (!) 148/97 (!) 142/87 (!) 174/111 (!) 150/93  Pulse: 74 69 72 66  Resp:  16 16 16   Temp: 98.1 F (36.7 C)  98.2 F (36.8 C) 97.8 F (36.6 C)  TempSrc: Oral  Oral Oral  SpO2: 100% 99% 99% 99%  Weight:   76 kg   Height:        Intake/Output Summary (Last 24 hours) at 09/13/2019 1151 Last data filed at 09/12/2019 1400 Gross per 24 hour  Intake 240 ml  Output --  Net 240 ml   Filed Weights   09/12/19 0805 09/13/19 0929  Weight: 76.7 kg 76 kg    Examination:  General exam: Pleasant male sitting up in bed AAOx3, no distress Respiratory system: Clear  cardiovascular system: S1 & S2 heard, RRR.  Gastrointestinal system: Abdomen is nondistended, soft and nontender.Normal bowel sounds heard.,  PD catheter noted Central nervous system: Alert and oriented. No focal neurological deficits. Extremities: Right entire arm with dressing elevated Skin: As above Psychiatry: Judgement and insight appear normal. Mood & affect appropriate.     Data Reviewed:   CBC: Recent Labs  Lab 09/08/19 2128 09/12/19 0908 09/12/19 1442 09/13/19 0429  WBC 6.9  --  8.4 8.0  NEUTROABS 5.0  --  7.3  --   HGB 9.5* 10.2* 9.5* 9.0*  HCT 30.6* 30.0* 30.3* 28.1*  MCV 86.2  --  86.3 85.9  PLT 162  --  132* 349*   Basic Metabolic Panel: Recent Labs  Lab 09/08/19 2128 09/12/19 0908 09/12/19 1442 09/13/19 0429  NA 134* 133* 131* 131*  K 4.4 4.9 4.6 4.7  CL 97* 100 96* 97*  CO2 22  --  22 22  GLUCOSE 125* 102* 267* 124*  BUN 73* 76* 68* 66*  CREATININE 20.03* >18.00* 20.05* 18.53*  CALCIUM 8.0*  --  7.5* 7.5*   MG  --   --  1.6* 2.1   GFR: Estimated Creatinine Clearance: 5.4 mL/min (A) (by C-G formula based on SCr of 18.53 mg/dL (H)). Liver Function Tests: Recent Labs  Lab 09/08/19 2128 09/12/19 1442  AST 10* 16  ALT 14 9  ALKPHOS 65 48  BILITOT 0.8 0.4  PROT 6.0* 5.9*  ALBUMIN 2.3* 2.0*   No results for input(s): LIPASE, AMYLASE in the last 168 hours. No results for input(s): AMMONIA in the last 168 hours. Coagulation Profile: No results for input(s): INR, PROTIME in the last 168 hours. Cardiac Enzymes: No results for input(s): CKTOTAL, CKMB, CKMBINDEX, TROPONINI in the last 168 hours. BNP (last 3 results) No results for input(s): PROBNP in the last 8760 hours. HbA1C: No results for input(s): HGBA1C in the last 72 hours. CBG: No results for input(s): GLUCAP in the last 168 hours. Lipid Profile: No results for input(s): CHOL, HDL, LDLCALC, TRIG, CHOLHDL, LDLDIRECT in the last 72 hours. Thyroid Function Tests: No results for input(s): TSH, T4TOTAL, FREET4, T3FREE, THYROIDAB in the last 72 hours. Anemia Panel: No results for input(s): VITAMINB12, FOLATE, FERRITIN, TIBC, IRON, RETICCTPCT in the last 72 hours. Urine analysis: No results found for: COLORURINE, APPEARANCEUR, LABSPEC, PHURINE, GLUCOSEU, HGBUR, BILIRUBINUR, KETONESUR, PROTEINUR, UROBILINOGEN, NITRITE, LEUKOCYTESUR Sepsis Labs: @LABRCNTIP (procalcitonin:4,lacticidven:4)  ) Recent Results (from the past 240 hour(s))  SARS CORONAVIRUS 2 (TAT 6-24 HRS) Nasopharyngeal Nasopharyngeal Swab     Status: None   Collection Time: 09/10/19  2:22 PM   Specimen: Nasopharyngeal Swab  Result Value Ref Range Status   SARS Coronavirus 2 NEGATIVE NEGATIVE Final    Comment: (NOTE) SARS-CoV-2 target nucleic acids are NOT DETECTED.  The SARS-CoV-2 RNA is generally detectable in upper and lower respiratory specimens during the acute phase of infection. Negative results do not preclude SARS-CoV-2 infection, do not rule out co-infections  with other pathogens, and should not be used as the sole basis for treatment or other patient management decisions. Negative results must be combined with clinical observations, patient history, and epidemiological information. The expected result is Negative.  Fact Sheet for Patients: SugarRoll.be  Fact Sheet for Healthcare Providers: https://www.woods-mathews.com/  This test is not yet approved or cleared by the [place] FDA and  has been authorized for detection and/or diagnosis of SARS-CoV-2 by FDA under an Emergency Use Authorization (EUA). This EUA will remain  in effect (meaning this test can be used) for the duration of the COVID-19 declaration under Se ction 564(b)(1) of the Act, 21 U.S.C. section 360bbb-3(b)(1), unless the authorization is terminated or revoked sooner.  Performed at [facility], [place] [address], [place],  [address]          Radiology Studies: No results found.      Scheduled Meds:  amLODipine  10 mg Oral Daily   calcitRIOL  1.5 mcg Oral  Daily   carvedilol  12.5 mg Oral BID WC   cloNIDine  0.1 mg Oral BID   ferric citrate  630 mg Oral TID WC   furosemide  80 mg Oral Daily   gentamicin cream  1 application Topical Daily   pantoprazole  40 mg Oral Daily   Continuous Infusions:  dialysis solution 2.5% low-MG/low-CA       LOS: 1 day    Time spent: 3mn   PDomenic Polite MD [facility] 09/13/2019, 11:51 AM

## 2019-09-13 NOTE — TOC Initial Note (Signed)
Transition of Care [facility]) - Initial/Assessment Note    Patient Details  Name: Nathan Castaneda MRN: [id number] Date of Birth: 07/23/1975  Transition of Care [facility]) CM/SW Contact:    Bartholomew Crews, RN Phone Number: [phone] 09/13/2019, 11:27 AM  Clinical Narrative:                  Spoke with patient at the bedside. States that he has his car in hospital parking lot. No TOC needs identified at this time.   Expected Discharge Plan: Home/Self Care Barriers to Discharge: Continued Medical Work up   Patient Goals and CMS Choice Patient states their goals for this hospitalization and ongoing recovery are:: return home CMS Medicare.gov Compare Post Acute Care list provided to:: Patient Choice offered to / list presented to : NA  Expected Discharge Plan and Services Expected Discharge Plan: Home/Self Care   Discharge Planning Services: CM Consult Post Acute Care Choice: NA Living arrangements for the past 2 months: Apartment                 DME Arranged: N/A DME Agency: NA       HH Arranged: NA [place] Agency: NA        Prior Living Arrangements/Services Living arrangements for the past 2 months: Apartment   Patient language and need for interpreter reviewed:: Yes        Need for Family Participation in Patient Care: No (Comment)     Criminal Activity/Legal Involvement Pertinent to Current Situation/Hospitalization: No - Comment as needed  Activities of Daily Living Home Assistive Devices/Equipment: None ADL Screening (condition at time of admission) Patient's cognitive ability adequate to safely complete daily activities?: Yes Is the patient deaf or have difficulty hearing?: No Does the patient have difficulty seeing, even when wearing glasses/contacts?: No Does the patient have difficulty concentrating, remembering, or making decisions?: No Patient able to express need for assistance with ADLs?: Yes Does the patient have difficulty dressing or bathing?:  No Independently performs ADLs?: Yes (appropriate for developmental age) Does the patient have difficulty walking or climbing stairs?: No Weakness of Legs: None Weakness of Arms/Hands: None  Permission Sought/Granted                  Emotional Assessment Appearance:: Appears stated age Attitude/Demeanor/Rapport: Engaged Affect (typically observed): Accepting Orientation: : Oriented to Self, Oriented to  Time, Oriented to Place, Oriented to Situation Alcohol / Substance Use: Not Applicable Psych Involvement: No (comment)  Admission diagnosis:  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 Hypertension   . Anemia of chronic disease   . Hyponatremia   . 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 Acute on chronic anemia 09/02/2019  . ESRD (end stage renal disease) ([place]) 09/02/2019  . Hypertensive urgency 09/02/2019  . Nausea & vomiting 09/02/2019  . Epistaxis 09/02/2019   PCP:  Patient, No Pcp Per Pharmacy:   [facility] [address] [place] [address]  [place] [address] Phone: [phone] Fax: [phone]     Social Determinants of Health (SDOH) Interventions    Readmission Risk Interventions No flowsheet data found.

## 2019-09-13 NOTE — Plan of Care (Signed)
  Problem: Education: Goal: Knowledge of General Education information will improve Description Including pain rating scale, medication(s)/side effects and non-pharmacologic comfort measures Outcome: Progressing   

## 2019-09-13 NOTE — Progress Notes (Signed)
[facility] [place]  Subjective  - right arm sore no numbness in hand   Objective (!) 174/111 72 98.2 F (36.8 C) (Oral) 16 99%  Intake/Output Summary (Last 24 hours) at 09/13/2019 0956 Last data filed at 09/12/2019 1400 Gross per 24 hour  Intake 640 ml  Output 15 ml  Net 625 ml   2+ right radial pulse Right forearm and upper arm edematous Right arm incision intact some serous drainge Left ankle incision clean  Assessment/Planning: POD #1 s/p right brachial interposition graft for pseudoaneurysm Continue kerlix ACE Elevate arm probably ready for d/c next 1-2 days from my standpoint  Ruta Hinds 09/13/2019 9:56 AM --  Laboratory Lab Results: Recent Labs    09/12/19 1442 09/13/19 0429  WBC 8.4 8.0  HGB 9.5* 9.0*  HCT 30.3* 28.1*  PLT 132* 128*   BMET Recent Labs    09/12/19 1442 09/13/19 0429  NA 131* 131*  K 4.6 4.7  CL 96* 97*  CO2 22 22  GLUCOSE 267* 124*  BUN 68* 66*  CREATININE 20.05* 18.53*  CALCIUM 7.5* 7.5*    COAG No results found for: INR, PROTIME No results found for: PTT

## 2019-09-13 NOTE — Care Management CC44 (Signed)
Condition Code 44 Documentation Completed  Patient Details  Name: Rondel Episcopo Bohannon MRN: [id number] Date of Birth: 1975/06/07   Condition Code 44 given:  Yes Patient signature on Condition Code 44 notice:  Yes Documentation of 2 MD's agreement:  Yes Code 44 added to claim:  Yes    Bartholomew Crews, RN 09/13/2019, 11:23 AM

## 2019-09-14 DIAGNOSIS — I721 Aneurysm of artery of upper extremity: Secondary | ICD-10-CM | POA: Diagnosis not present

## 2019-09-14 DIAGNOSIS — I729 Aneurysm of unspecified site: Secondary | ICD-10-CM | POA: Diagnosis not present

## 2019-09-14 MED ORDER — OXYCODONE-ACETAMINOPHEN 5-325 MG PO TABS
1.0000 | ORAL_TABLET | Freq: Four times a day (QID) | ORAL | 0 refills | Status: DC | PRN
Start: 2019-09-14 — End: 2019-09-26

## 2019-09-14 MED ORDER — ACETAMINOPHEN 325 MG PO TABS: 650.0000 mg | ORAL_TABLET | Freq: Four times a day (QID) | ORAL | Status: DC | PRN

## 2019-09-14 NOTE — TOC Transition Note (Addendum)
Transition of Care [facility]) - CM/SW Discharge Note   Patient Details  Name: Nathan Castaneda MRN: [id number] Date of Birth: May 14, 1975  Transition of Care [facility]) CM/SW Contact:  Sharin Mons, RN Phone Number: [phone] 09/14/2019, 2:40 PM   Clinical Narrative:    Patient will DC to: Home Anticipated DC date: 09/14/2019 Transport by: car  Admitted for scheduled repair of pseudoaneurysm of right brachial artery. Pt from home alone. States recently moved to [place] 3 months ago and works for [employer].      - s/p 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 artery with reversed saphenous vein from left ankle, 9/3  Per MD patient ready for DC today. RN and patient of DC plan.    NCM noted home health order. Pt agreeable to home health services. Choice provided. Pt without preference. Referral made with [facility] and accepted, [place] 9/10. Marland Kitchen   Post hospital follow up noted on AVS.  Pt states car is at the hospital and will drive self home.  RNCM will sign off for now as intervention is no longer needed. Please consult [place] again if new needs arise.   Final next level of care: [place] Barriers to Discharge: No Barriers Identified   Patient Goals and CMS Choice Patient states their goals for this hospitalization and ongoing recovery are:: return home CMS Medicare.gov Compare Post Acute Care list provided to:: Patient Choice offered to / list presented to : Patient  Discharge Placement                       Discharge Plan and Services   Discharge Planning Services: CM Consult Post Acute Care Choice: NA          DME Arranged: N/A DME Agency: NA       HH Arranged: RN [place] Agency: [facility] [employer] Date [place]: 09/14/19 Time [place]: 1440 Representative spoke with at [place]: Tracie  Social Determinants of Health ([place]) Interventions     Readmission Risk Interventions No  flowsheet data found.

## 2019-09-14 NOTE — Progress Notes (Signed)
Nsg Discharge Note  Patient discharged home. Education reviewed for surgical site care of right arm. Supplies sent home with him. Script for pain medication given to patient.  Admit Date:  09/12/2019 Discharge date: 09/14/2019   Nathan Castaneda to be D/C'd Home per MD order.  AVS completed.  Copy for chart, and copy for patient signed, and dated. Patient/caregiver able to verbalize understanding.  Discharge Medication: Allergies as of 09/14/2019      Reactions   Vancomycin Itching      Medication List    STOP taking these medications   ibuprofen 200 MG tablet Commonly known as: ADVIL     TAKE these medications   acetaminophen 325 MG tablet Commonly known as: TYLENOL Take 2 tablets (650 mg total) by mouth every 6 (six) hours as needed for mild pain (or Fever >/= 101).   amLODipine 5 MG tablet Commonly known as: NORVASC Take 5 mg by mouth daily.   calcitRIOL 0.5 MCG capsule Commonly known as: ROCALTROL Take 1.5 mcg by mouth daily.   carvedilol 12.5 MG tablet Commonly known as: COREG Take 12.5 mg by mouth in the morning and at bedtime.   cloNIDine 0.1 MG tablet Commonly known as: CATAPRES Take 0.1 mg by mouth in the morning and at bedtime.   ferric citrate 1 GM 210 MG(Fe) tablet Commonly known as: AURYXIA Take 210 mg by mouth in the morning, at noon, and at bedtime.   furosemide 80 MG tablet Commonly known as: LASIX Take 80 mg by mouth daily.   oxyCODONE-acetaminophen 5-325 MG tablet Commonly known as: PERCOCET/ROXICET Take 1 tablet by mouth every 6 (six) hours as needed for severe pain.   pantoprazole 40 MG tablet Commonly known as: PROTONIX Take 40 mg by mouth daily.            Discharge Care Instructions  (From admission, onward)         Start     Ordered   09/14/19 0000  Discharge wound care:       Comments: Clean with soap and water once a day and then cover with 3-4gauze pads followed by ACE wrap   09/14/19 0959          Discharge  Assessment: Vitals:   09/14/19 0536 09/14/19 0927  BP: 129/86 136/89  Pulse: 67 70  Resp: 19 18  Temp: 98.1 F (36.7 C) 98 F (36.7 C)  SpO2: 100% 100%   Skin clean, dry and intact without evidence of skin break down, no evidence of skin tears noted. IV catheter discontinued intact. Site without signs and symptoms of complications - no redness or edema noted at insertion site, patient denies c/o pain - only slight tenderness at site.  Dressing with slight pressure applied.  D/c Instructions-Education: Discharge instructions given to patient/family with verbalized understanding. D/c education completed with patient/family including follow up instructions, medication list, d/c activities limitations if indicated, with other d/c instructions as indicated by MD - patient able to verbalize understanding, all questions fully answered. Patient instructed to return to ED, call 911, or call MD for any changes in condition.  Patient escorted via [place], and D/C home via private auto.  Erasmo Leventhal, RN 09/14/2019 4:08 PM

## 2019-09-14 NOTE — Progress Notes (Signed)
PT just lost IV access and does not want another one. RN messaged MD on call to make aware. PT is not getting anything through IV.   Eleanora Neighbor, RN

## 2019-09-14 NOTE — Progress Notes (Addendum)
  Progress Note    09/14/2019 8:20 AM 2 Days Post-Op  Subjective:  No new complaints this morning   Vitals:   09/13/19 2126 09/14/19 0536  BP: (!) 150/97 129/86  Pulse: 75 67  Resp: 18 19  Temp: 98.7 F (37.1 C) 98.1 F (36.7 C)  SpO2: 98% 100%   Physical Exam: Lungs:  Non labored Incisions:  R arm incision with healthy wound bed; L ankle incision c/d/i Extremities:  Palpable R radial pulse Neurologic: A&O  CBC    Component Value Date/Time   WBC 8.0 09/13/2019 0429   RBC 3.27 (L) 09/13/2019 0429   HGB 9.0 (L) 09/13/2019 0429   HCT 28.1 (L) 09/13/2019 0429   PLT 128 (L) 09/13/2019 0429   MCV 85.9 09/13/2019 0429   MCH 27.5 09/13/2019 0429   MCHC 32.0 09/13/2019 0429   RDW 17.3 (H) 09/13/2019 0429   LYMPHSABS 0.6 (L) 09/12/2019 1442   MONOABS 0.3 09/12/2019 1442   EOSABS 0.0 09/12/2019 1442   BASOSABS 0.0 09/12/2019 1442    BMET    Component Value Date/Time   NA 131 (L) 09/13/2019 0429   K 4.7 09/13/2019 0429   CL 97 (L) 09/13/2019 0429   CO2 22 09/13/2019 0429   GLUCOSE 124 (H) 09/13/2019 0429   BUN 66 (H) 09/13/2019 0429   CREATININE 18.53 (H) 09/13/2019 0429   CALCIUM 7.5 (L) 09/13/2019 0429   GFRNONAA 3 (L) 09/13/2019 0429   GFRAA 3 (L) 09/13/2019 0429    INR No results found for: INR   Intake/Output Summary (Last 24 hours) at 09/14/2019 0820 Last data filed at 09/14/2019 0600 Gross per 24 hour  Intake 600 ml  Output 100 ml  Net 500 ml     Assessment/Plan:  44 y.o. male is s/p repair of R brachial artery 2 Days Post-Op   Palpable R radial pulse R arm still edematous; continue kerlex and ACE and elevation of R arm Ok for discharge from vascular standpoint; office will arrange follow up in 2-3 weeks   Dagoberto Ligas, PA-C [facility] [phone] 09/14/2019 8:20 AM  Agree with above.  Emphasized to pt no heavy lifting with right arm.  Continue dry gauze over incision with ACE from right hand to shoulder Follow up in a  few weeks our office will contact pt  Ruta Hinds, MD [facility] [place] Office: [phone]

## 2019-09-14 NOTE — Progress Notes (Signed)
Subjective: No complaints ,tolerated CCPD, noted plans for discharge  Objective Vital signs in last 24 hours: Vitals:   09/13/19 1750 09/13/19 2126 09/14/19 0536 09/14/19 0927  BP: (!) 150/93 (!) 150/97 129/86 136/89  Pulse: 66 75 67 70  Resp: 15 18 19 18   Temp: 97.6 F (36.4 C) 98.7 F (37.1 C) 98.1 F (36.7 C) 98 F (36.7 C)  TempSrc: Oral Oral Oral   SpO2: 100% 98% 100% 100%  Weight:      Height:       Weight change: -0.7 kg  Physical Exam: General: Alert, NAD Heart: RRR, no MRG Lungs: CTA nonlabored Abdomen: BS+, soft, NT ND PD catheter in place Extremities: No pedal edema Dialysis Access: Right upper arm AV fistula Ace wrap dry clean in place, no bruit, PD catheter in place dressing dry clean  OP PD dialysis Orders: Center:[facility]. CCPD 7 X weekly. 75 kg Uses 2.5% . 5 exchanges, fill volume 2800 ml, dwell time 1 hour 40 minutes. Last fill 1100 ml.   Problem/Plan 1. Pseudoaneurysm R ACF S/P exploration of pseudoaneurysm, replacement of of R brachial artery with reversed saphenous vein from L ankle. Plan for VVSprobably discharged today 2. ESRD - Continue CCPD per home orders. Says he uses 2.5% all exchanges. SCr noted to be 18-20 08/25-09/03/2019. Does 5 exchanges. , 9/04 =K4.7, BUN 66, CR 18.512  3. Hypertension/volume -Volume seems OK. BP improving with better CCPD today and increased BP meds slightly, was elevated on arrival to PACU also element of pain at that time 4. Anemia - HGB 10.2 >9.0 Follow trend.ESA at home training 5. Metabolic bone disease - Appears to have issues with elevated PO4 and PTH as OP. Continue VDRA and binders.Corrected calcium at goal 6. Nutrition - Albumin 2.0 . Add protein supps. Low as expected with PD 7. H/O Lupus  Ernest Haber, PA-C [facility] [phone] 09/14/2019,1:42 PM  LOS: 1 day   Labs: Basic Metabolic Panel: Recent Labs  Lab 09/08/19 2128 09/08/19 2128 09/12/19 0908  09/12/19 1442 09/13/19 0429  NA 134*   < > 133* 131* 131*  K 4.4   < > 4.9 4.6 4.7  CL 97*   < > 100 96* 97*  CO2 22  --   --  22 22  GLUCOSE 125*   < > 102* 267* 124*  BUN 73*   < > 76* 68* 66*  CREATININE 20.03*   < > >18.00* 20.05* 18.53*  CALCIUM 8.0*  --   --  7.5* 7.5*   < > = values in this interval not displayed.   Liver Function Tests: Recent Labs  Lab 09/08/19 2128 09/12/19 1442  AST 10* 16  ALT 14 9  ALKPHOS 65 48  BILITOT 0.8 0.4  PROT 6.0* 5.9*  ALBUMIN 2.3* 2.0*   No results for input(s): LIPASE, AMYLASE in the last 168 hours. No results for input(s): AMMONIA in the last 168 hours. CBC: Recent Labs  Lab 09/08/19 2128 09/08/19 2128 09/12/19 0908 09/12/19 1442 09/13/19 0429  WBC 6.9  --   --  8.4 8.0  NEUTROABS 5.0  --   --  7.3  --   HGB 9.5*   < > 10.2* 9.5* 9.0*  HCT 30.6*   < > 30.0* 30.3* 28.1*  MCV 86.2  --   --  86.3 85.9  PLT 162  --   --  132* 128*   < > = values in this interval not displayed.  Cardiac Enzymes: No results for input(s): CKTOTAL, CKMB, CKMBINDEX, TROPONINI in the last 168 hours. CBG: No results for input(s): GLUCAP in the last 168 hours.  Studies/Results: No results found. Medications: . dialysis solution 2.5% low-MG/low-CA     . amLODipine  10 mg Oral Daily  . calcitRIOL  1.5 mcg Oral Daily  . carvedilol  12.5 mg Oral BID WC  . cloNIDine  0.1 mg Oral BID  . ferric citrate  630 mg Oral TID WC  . furosemide  80 mg Oral Daily  . gentamicin cream  1 application Topical Daily  . pantoprazole  40 mg Oral Daily

## 2019-09-14 NOTE — Discharge Summary (Signed)
Physician Discharge Summary  Mel Langan Shimkus RJJ:[id number] DOB: July 06, 1975 DOA: 09/12/2019  PCP: Patient, No Pcp Per  Admit date: 09/12/2019 Discharge date: 09/14/2019  Time spent: 35 minutes  Recommendations for Outpatient Follow-up:  1. Vascular surgery Dr. 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 Donnetta Hutching. Patient had right arm AVaccessseveral years ago which was placed in [place]. He had worsening swelling of right arm therefore he was evaluated by vascular surgeon and was found to have pseudoaneurysm of right brachial artery. He underwent exploration of false aneurysm and replacement of right brachial artery with reverse saphenous vein from left ankle 9/3 am by vascular surgery Dr. Donnetta Hutching. [facility] consulted for admission due to multiple chronic medical problems  Hospital Course:   Pseudoaneurysm of brachial artery: -Status post exploration and right brachial interposition graft by Dr. Donnetta Hutching 9/3 -Pain control -Wound care, arm elevation -Discharge home with dressing supplies, FU with VVS in 2 weeks  ESRD on peritoneal dialysis: -Nephrology consulted. -Continue PD  Hypertension: -Continue home meds-Coreg, amlodipine, clonidine  Anemia of chronic disease: -Hemoglobin stable  Hyponatremia: Chronic -Stable.  History of CHF: Unknown type. Appears euvolemic on  exam. -Reviewed documents from care everywhere. No recent echo -Continue Lasix and fluid removal with PD  Hypomagnesemia: Replenished. - Consultants:   VVS   Procedures: PROCEDURE: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 artery with reversed saphenous vein from left ankle 9/3  Discharge Exam: Vitals:   09/14/19 0536 09/14/19 0927  BP: 129/86 136/89  Pulse: 67 70  Resp: 19 18  Temp: 98.1 F (36.7 C) 98 F (36.7 C)  SpO2: 100% 100%    General: AAOx3 Cardiovascular: S1S2/RRR Respiratory: CTAB  Discharge Instructions   Discharge Instructions    Diet - low sodium heart healthy   Complete by: As directed    Discharge wound care:   Complete by: As directed    Clean with soap and water once a day and then cover with 3-4gauze pads followed by ACE wrap   Increase activity slowly   Complete by: As directed      Allergies as of 09/14/2019      Reactions   Vancomycin Itching      Medication List    STOP taking these medications   ibuprofen 200 MG tablet Commonly known as: ADVIL     TAKE these medications   acetaminophen 325 MG tablet Commonly known as: TYLENOL Take 2 tablets (650 mg total) by mouth every 6 (six) hours as needed for mild pain (or Fever >/= 101).   amLODipine 5 MG tablet Commonly known as: NORVASC Take 5 mg by mouth daily.   calcitRIOL 0.5 MCG capsule Commonly known as: ROCALTROL Take 1.5 mcg by mouth daily.   carvedilol 12.5 MG tablet Commonly known as: COREG Take 12.5 mg by mouth in the morning and at bedtime.   cloNIDine 0.1 MG tablet Commonly known as: CATAPRES Take 0.1 mg by  mouth in the morning and at bedtime.   ferric citrate 1 GM 210 MG(Fe) tablet Commonly known as: AURYXIA Take 210 mg by mouth in the morning, at noon, and at bedtime.   furosemide 80 MG tablet Commonly known as: LASIX Take 80 mg by mouth daily.   oxyCODONE-acetaminophen 5-325 MG tablet Commonly known as: PERCOCET/ROXICET Take 1  tablet by mouth every 6 (six) hours as needed for severe pain.   pantoprazole 40 MG tablet Commonly known as: PROTONIX Take 40 mg by mouth daily.            Discharge Care Instructions  (From admission, onward)         Start     Ordered   09/14/19 0000  Discharge wound care:       Comments: Clean with soap and water once a day and then cover with 3-4gauze pads followed by ACE wrap   09/14/19 0959         Allergies  Allergen Reactions  . Vancomycin Itching    Follow-up Information    [facility]. Go in 2 week(s).   Specialty: Vascular Surgery Contact information: [address] [place] [phone]               The results of significant diagnostics from this hospitalization (including imaging, microbiology, ancillary and laboratory) are listed below for reference.    Significant Diagnostic Studies: CT Head Wo Contrast  Result Date: 09/01/2019 CLINICAL DATA:  Mental status change. Alcohol and drug use suspected. EXAM: CT HEAD WITHOUT CONTRAST TECHNIQUE: Contiguous axial images were obtained from the base of the skull through the vertex without intravenous contrast. COMPARISON:  None. FINDINGS: Brain: No intracranial hemorrhage, mass effect, or midline shift. No hydrocephalus. The basilar cisterns are patent. No evidence of territorial infarct or acute ischemia. No extra-axial or intracranial fluid collection. Vascular: No hyperdense vessel or unexpected calcification. Skull: No fracture or focal lesion. Sinuses/Orbits: Mild mucosal thickening without sinus fluid level. Mastoid air cells are clear. Slight dysconjugate gaze, typically incidental. Other: None. IMPRESSION: No acute intracranial abnormality. Electronically Signed   By: Keith Rake M.D.   On: 09/01/2019 21:08    Microbiology: Recent Results (from the past 240 hour(s))  SARS CORONAVIRUS 2 (TAT 6-24 HRS) Nasopharyngeal Nasopharyngeal Swab      Status: None   Collection Time: 09/10/19  2:22 PM   Specimen: Nasopharyngeal Swab  Result Value Ref Range Status   SARS Coronavirus 2 NEGATIVE NEGATIVE Final    Comment: (NOTE) SARS-CoV-2 target nucleic acids are NOT DETECTED.  The SARS-CoV-2 RNA is generally detectable in upper and lower respiratory specimens during the acute phase of infection. Negative results do not preclude SARS-CoV-2 infection, do not rule out co-infections with other pathogens, and should not be used as the sole basis for treatment or other patient management decisions. Negative results must be combined with clinical observations, patient history, and epidemiological information. The expected result is Negative.  Fact Sheet for Patients: SugarRoll.be  Fact Sheet for Healthcare Providers: https://www.woods-mathews.com/  This test is not yet approved or cleared by the [place] FDA and  has been authorized for detection and/or diagnosis of SARS-CoV-2 by FDA under an Emergency Use Authorization (EUA). This EUA will remain  in effect (meaning this test can be used) for the duration of the COVID-19 declaration under Se ction 564(b)(1) of the Act, 21 U.S.C. section 360bbb-3(b)(1), unless the authorization is terminated or revoked sooner.  Performed at [facility]  Lab, [address] [place], [place] [address]      Labs: Basic Metabolic Panel: Recent Labs  Lab 09/08/19 2128 09/12/19 0908 09/12/19 1442 09/13/19 0429  NA 134* 133* 131* 131*  K 4.4 4.9 4.6 4.7  CL 97* 100 96* 97*  CO2 22  --  22 22  GLUCOSE 125* 102* 267* 124*  BUN 73* 76* 68* 66*  CREATININE 20.03* >18.00* 20.05* 18.53*  CALCIUM 8.0*  --  7.5* 7.5*  MG  --   --  1.6* 2.1   Liver Function Tests: Recent Labs  Lab 09/08/19 2128 09/12/19 1442  AST 10* 16  ALT 14 9  ALKPHOS 65 48  BILITOT 0.8 0.4  PROT 6.0* 5.9*  ALBUMIN 2.3* 2.0*   No results for input(s): LIPASE, AMYLASE in  the last 168 hours. No results for input(s): AMMONIA in the last 168 hours. CBC: Recent Labs  Lab 09/08/19 2128 09/12/19 0908 09/12/19 1442 09/13/19 0429  WBC 6.9  --  8.4 8.0  NEUTROABS 5.0  --  7.3  --   HGB 9.5* 10.2* 9.5* 9.0*  HCT 30.6* 30.0* 30.3* 28.1*  MCV 86.2  --  86.3 85.9  PLT 162  --  132* 128*   Cardiac Enzymes: No results for input(s): CKTOTAL, CKMB, CKMBINDEX, TROPONINI in the last 168 hours. BNP: BNP (last 3 results) No results for input(s): BNP in the last 8760 hours.  ProBNP (last 3 results) No results for input(s): PROBNP in the last 8760 hours.  CBG: No results for input(s): GLUCAP in the last 168 hours.     Signed:  Domenic Polite MD.  [facility] 09/14/2019, 2:25 PM

## 2019-09-19 ENCOUNTER — Telehealth: Payer: Self-pay

## 2019-09-19 NOTE — Telephone Encounter (Signed)
Darlene @ [facility] left VM stating pt has returned to work and is doing his own dressing changes himself, he is not homebound and insurance will not cover [place] services since he has returned to work - intake denied. Called pt to f/u and advise of the message that was left. Pt stated that the nurse said "I'm not going to come because you have went back to work" Advised pt that I would attempt to contact another agency on Monday. Pt did state that he had been doing his own dressing changes according to how it was done in the hospital. Pt denied fever, swelling and redness at the incision site. Stated that the are was draining, blood tinged, stated pain was 3- 6 most of the time and described as throbbing, Advised that if he began to run a fever experience extreme pain, swelling, redness or warmth at the incision site to contact our office or if after hours report to the nearest ED. Pt verbalized understanding and agreed with this plan.

## 2019-09-22 ENCOUNTER — Telehealth: Payer: Self-pay

## 2019-09-22 NOTE — Telephone Encounter (Signed)
Called pt to discuss home health concerns, pt advised that he would continue doing his dressing changes as he has been until f/u appointment on 10/06/19. Pt denied fever/chills, redness and warmth at the site and any signs of infection. He did state that he was having some mild swelling in his right hand and arm as well as some mild pain in wrist area 2/10. Advised that he should keep his arm elevated when possible and he stated he does that when he can. Advised pt if his pain gets severe, he becomes febrile or incision starts to open, drain excessively or became red and warm to the touch to give our office a call immediately or go the the nearest ED. Pt verbalized understanding and agreed with this plan.

## 2019-09-25 ENCOUNTER — Other Ambulatory Visit (HOSPITAL_COMMUNITY): Payer: Medicare HMO

## 2019-09-26 ENCOUNTER — Ambulatory Visit (HOSPITAL_COMMUNITY): Payer: Medicare HMO | Admitting: Certified Registered Nurse Anesthetist

## 2019-09-26 ENCOUNTER — Ambulatory Visit (INDEPENDENT_AMBULATORY_CARE_PROVIDER_SITE_OTHER): Payer: Self-pay | Admitting: Physician Assistant

## 2019-09-26 ENCOUNTER — Encounter (HOSPITAL_COMMUNITY): Admission: RE | Disposition: A | Discharge status: Home / Self Care | Payer: Self-pay | Attending: Vascular Surgery

## 2019-09-26 ENCOUNTER — Other Ambulatory Visit: Payer: Self-pay

## 2019-09-26 ENCOUNTER — Encounter (HOSPITAL_COMMUNITY): Payer: Self-pay | Admitting: Vascular Surgery

## 2019-09-26 ENCOUNTER — Ambulatory Visit (HOSPITAL_COMMUNITY)
Admission: RE | Admit: 2019-09-26 | Discharge: 2019-09-26 | Disposition: A | Discharge status: Home / Self Care | Payer: Medicare HMO | Source: Other Acute Inpatient Hospital | Attending: Vascular Surgery | Admitting: Vascular Surgery

## 2019-09-26 VITALS — BP 153/100 | HR 90 | Temp 97.9°F | Resp 20 | Ht 70.0 in | Wt 169.4 lb

## 2019-09-26 DIAGNOSIS — F17200 Nicotine dependence, unspecified, uncomplicated: Secondary | ICD-10-CM | POA: Diagnosis not present

## 2019-09-26 DIAGNOSIS — Z20822 Contact with and (suspected) exposure to covid-19: Secondary | ICD-10-CM | POA: Insufficient documentation

## 2019-09-26 DIAGNOSIS — Y838 Other surgical procedures as the cause of abnormal reaction of the patient, or of later complication, without mention of misadventure at the time of the procedure: Secondary | ICD-10-CM | POA: Insufficient documentation

## 2019-09-26 DIAGNOSIS — Z881 Allergy status to other antibiotic agents status: Secondary | ICD-10-CM | POA: Diagnosis not present

## 2019-09-26 DIAGNOSIS — Z992 Dependence on renal dialysis: Secondary | ICD-10-CM | POA: Diagnosis not present

## 2019-09-26 DIAGNOSIS — N186 End stage renal disease: Secondary | ICD-10-CM | POA: Diagnosis not present

## 2019-09-26 DIAGNOSIS — Z79899 Other long term (current) drug therapy: Secondary | ICD-10-CM | POA: Insufficient documentation

## 2019-09-26 DIAGNOSIS — T8131XA Disruption of external operation (surgical) wound, not elsewhere classified, initial encounter: Secondary | ICD-10-CM | POA: Insufficient documentation

## 2019-09-26 DIAGNOSIS — I120 Hypertensive chronic kidney disease with stage 5 chronic kidney disease or end stage renal disease: Secondary | ICD-10-CM | POA: Insufficient documentation

## 2019-09-26 DIAGNOSIS — I9789 Other postprocedural complications and disorders of the circulatory system, not elsewhere classified: Secondary | ICD-10-CM | POA: Diagnosis not present

## 2019-09-26 HISTORY — PX: WOUND DEBRIDEMENT: SHX247

## 2019-09-26 LAB — POCT I-STAT, CHEM 8
BUN: 58 mg/dL — ABNORMAL HIGH (ref 6–20)
Calcium, Ion: 1.1 mmol/L — ABNORMAL LOW (ref 1.15–1.40)
Chloride: 99 mmol/L (ref 98–111)
Creatinine, Ser: 17.2 mg/dL — ABNORMAL HIGH (ref 0.61–1.24)
Glucose, Bld: 84 mg/dL (ref 70–99)
HCT: 28 % — ABNORMAL LOW (ref 39.0–52.0)
Hemoglobin: 9.5 g/dL — ABNORMAL LOW (ref 13.0–17.0)
Potassium: 4.1 mmol/L (ref 3.5–5.1)
Sodium: 137 mmol/L (ref 135–145)
TCO2: 25 mmol/L (ref 22–32)

## 2019-09-26 LAB — SARS CORONAVIRUS 2 BY RT PCR (HOSPITAL ORDER, PERFORMED IN ~~LOC~~ HOSPITAL LAB): SARS Coronavirus 2: NEGATIVE

## 2019-09-26 SURGERY — DEBRIDEMENT, WOUND
Anesthesia: Monitor Anesthesia Care | Site: Arm Upper | Laterality: Right

## 2019-09-26 MED ORDER — PROPOFOL 10 MG/ML IV BOLUS
INTRAVENOUS | Status: DC | PRN
  Administered 2019-09-26: 50 mg via INTRAVENOUS
  Administered 2019-09-26: 150 mg via INTRAVENOUS

## 2019-09-26 MED ORDER — CHLORHEXIDINE GLUCONATE 4 % EX LIQD: 60.0000 mL | Freq: Once | CUTANEOUS | Status: DC

## 2019-09-26 MED ORDER — LIDOCAINE-EPINEPHRINE 0.5 %-1:200000 IJ SOLN
INTRAMUSCULAR | Status: AC
  Filled 2019-09-26: qty 1

## 2019-09-26 MED ORDER — ORAL CARE MOUTH RINSE: 15.0000 mL | Freq: Once | OROMUCOSAL | Status: AC

## 2019-09-26 MED ORDER — SODIUM CHLORIDE 0.9 % IV SOLN: INTRAVENOUS | Status: DC | PRN

## 2019-09-26 MED ORDER — OXYCODONE-ACETAMINOPHEN 5-325 MG PO TABS
1.0000 | ORAL_TABLET | Freq: Four times a day (QID) | ORAL | 0 refills | Status: DC | PRN
Start: 2019-09-26 — End: 2021-03-31

## 2019-09-26 MED ORDER — CARVEDILOL 12.5 MG PO TABS: 12.5000 mg | ORAL_TABLET | Freq: Once | ORAL | Status: DC

## 2019-09-26 MED ORDER — ONDANSETRON HCL 4 MG/2ML IJ SOLN
INTRAMUSCULAR | Status: AC
  Filled 2019-09-26: qty 2

## 2019-09-26 MED ORDER — SODIUM CHLORIDE 0.9 % IV SOLN: INTRAVENOUS | Status: DC

## 2019-09-26 MED ORDER — MIDAZOLAM HCL 2 MG/2ML IJ SOLN
INTRAMUSCULAR | Status: AC
  Filled 2019-09-26: qty 2

## 2019-09-26 MED ORDER — CEFAZOLIN SODIUM-DEXTROSE 2-4 GM/100ML-% IV SOLN
2.0000 g | INTRAVENOUS | Status: AC
  Administered 2019-09-26: 2 g via INTRAVENOUS
  Filled 2019-09-26: qty 100

## 2019-09-26 MED ORDER — PROPOFOL 10 MG/ML IV BOLUS
INTRAVENOUS | Status: AC
  Filled 2019-09-26: qty 20

## 2019-09-26 MED ORDER — FENTANYL CITRATE (PF) 250 MCG/5ML IJ SOLN
INTRAMUSCULAR | Status: AC
  Filled 2019-09-26: qty 5

## 2019-09-26 MED ORDER — EPHEDRINE SULFATE 50 MG/ML IJ SOLN
INTRAMUSCULAR | Status: DC | PRN
  Administered 2019-09-26: 10 mg via INTRAVENOUS

## 2019-09-26 MED ORDER — LIDOCAINE 2% (20 MG/ML) 5 ML SYRINGE
INTRAMUSCULAR | Status: AC
  Filled 2019-09-26: qty 5

## 2019-09-26 MED ORDER — LACTATED RINGERS IV SOLN: INTRAVENOUS | Status: DC

## 2019-09-26 MED ORDER — ONDANSETRON HCL 4 MG/2ML IJ SOLN
INTRAMUSCULAR | Status: DC | PRN
  Administered 2019-09-26: 4 mg via INTRAVENOUS

## 2019-09-26 MED ORDER — 0.9 % SODIUM CHLORIDE (POUR BTL) OPTIME
TOPICAL | Status: DC | PRN
  Administered 2019-09-26: 1000 mL

## 2019-09-26 MED ORDER — FENTANYL CITRATE (PF) 100 MCG/2ML IJ SOLN
INTRAMUSCULAR | Status: DC | PRN
  Administered 2019-09-26 (×3): 50 ug via INTRAVENOUS

## 2019-09-26 MED ORDER — PROPOFOL 1000 MG/100ML IV EMUL
INTRAVENOUS | Status: AC
  Filled 2019-09-26: qty 100

## 2019-09-26 MED ORDER — CHLORHEXIDINE GLUCONATE 0.12 % MT SOLN
15.0000 mL | Freq: Once | OROMUCOSAL | Status: AC
  Administered 2019-09-26: 15 mL via OROMUCOSAL
  Filled 2019-09-26: qty 15

## 2019-09-26 MED ORDER — METOPROLOL TARTRATE 5 MG/5ML IV SOLN
INTRAVENOUS | Status: DC | PRN
  Administered 2019-09-26: 2 mg via INTRAVENOUS

## 2019-09-26 SURGICAL SUPPLY — 35 items
BNDG ELASTIC 4X5.8 VLCR STR LF (GAUZE/BANDAGES/DRESSINGS) ×3 IMPLANT
BNDG GAUZE ELAST 4 BULKY (GAUZE/BANDAGES/DRESSINGS) ×3 IMPLANT
CANISTER SUCT 3000ML PPV (MISCELLANEOUS) ×3 IMPLANT
CLIP LIGATING EXTRA MED SLVR (CLIP) ×3 IMPLANT
CLIP LIGATING EXTRA SM BLUE (MISCELLANEOUS) ×3 IMPLANT
COVER SURGICAL LIGHT HANDLE (MISCELLANEOUS) ×3 IMPLANT
COVER WAND RF STERILE (DRAPES) IMPLANT
DRAPE U-SHAPE 47X51 STRL (DRAPES) IMPLANT
DRAPE U-SHAPE 76X120 STRL (DRAPES) IMPLANT
DRSG EMULSION OIL 3X3 NADH (GAUZE/BANDAGES/DRESSINGS) ×3 IMPLANT
ELECT REM PT RETURN 9FT ADLT (ELECTROSURGICAL) ×3
ELECTRODE REM PT RTRN 9FT ADLT (ELECTROSURGICAL) ×1 IMPLANT
GAUZE SPONGE 4X4 12PLY STRL (GAUZE/BANDAGES/DRESSINGS) IMPLANT
GAUZE SPONGE 4X4 12PLY STRL LF (GAUZE/BANDAGES/DRESSINGS) ×3 IMPLANT
GLOVE SS BIOGEL STRL SZ 7.5 (GLOVE) ×1 IMPLANT
GLOVE SUPERSENSE BIOGEL SZ 7.5 (GLOVE) ×2
GOWN STRL REUS W/ TWL LRG LVL3 (GOWN DISPOSABLE) ×4 IMPLANT
GOWN STRL REUS W/TWL LRG LVL3 (GOWN DISPOSABLE) ×8
KIT BASIN OR (CUSTOM PROCEDURE TRAY) ×3 IMPLANT
KIT TURNOVER KIT B (KITS) ×3 IMPLANT
NS IRRIG 1000ML POUR BTL (IV SOLUTION) ×3 IMPLANT
PACK GENERAL/GYN (CUSTOM PROCEDURE TRAY) ×3 IMPLANT
PACK UNIVERSAL I (CUSTOM PROCEDURE TRAY) IMPLANT
PAD ARMBOARD 7.5X6 YLW CONV (MISCELLANEOUS) ×6 IMPLANT
STAPLER VISISTAT 35W (STAPLE) ×3 IMPLANT
STOCKINETTE 6  STRL (DRAPES) ×2
STOCKINETTE 6 STRL (DRAPES) ×1 IMPLANT
SUT ETHILON 2 0 PSLX (SUTURE) ×6 IMPLANT
SUT ETHILON 3 0 PS 1 (SUTURE) ×3 IMPLANT
SUT PROLENE 5 0 C1 (SUTURE) ×3 IMPLANT
SUT VIC AB 2-0 CTX 36 (SUTURE) IMPLANT
SUT VIC AB 3-0 SH 27 (SUTURE)
SUT VIC AB 3-0 SH 27X BRD (SUTURE) IMPLANT
TOWEL GREEN STERILE (TOWEL DISPOSABLE) ×3 IMPLANT
WATER STERILE IRR 1000ML POUR (IV SOLUTION) ×3 IMPLANT

## 2019-09-26 NOTE — Anesthesia Procedure Notes (Signed)
Procedure Name: LMA Insertion Date/Time: 09/26/2019 3:11 PM Performed by: Kathryne Hitch, CRNA Pre-anesthesia Checklist: Patient identified, Emergency Drugs available, Suction available and Patient being monitored Patient Re-evaluated:Patient Re-evaluated prior to induction Oxygen Delivery Method: Circle system utilized Preoxygenation: Pre-oxygenation with 100% oxygen Induction Type: IV induction Ventilation: Mask ventilation without difficulty LMA: LMA inserted LMA Size: 5.0 Number of attempts: 1 Placement Confirmation: positive ETCO2 and breath sounds checked- equal and bilateral Tube secured with: Tape

## 2019-09-26 NOTE — Progress Notes (Signed)
PDMP aware reviewed.  Percocet 5/325 one q6h prn pain #20 (twenty) no refill given.  Will have pt return in one week for wound check.  Nylon sutures/staples to remain 2-3 weeks.   Leontine Locket, [facility] 09/26/2019 3:51 PM

## 2019-09-26 NOTE — Progress Notes (Signed)
  POST OPERATIVE OFFICE NOTE    CC:  F/u for surgery; 2 weeks postop  HPI:  This is a 44 y.o. male who is s/p repair of false aneurysm of right upper extremity arteriovenous fistula with repair of brachial artery using left saphenous vein.  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  The fistula had not been used for some time as he was receiving peritoneal dialysis.  He had sudden worsening enlargement of the aneurysm with overlying erythema and he was taken to surgery for operative repair.  He had erythema throughout his entire arm from venous congestion and actual skin loss at the antecubital space due to the large expansion of this false aneurysm.  He presents today with reports of dehiscence of his incision. He states the wound has had this appearance since surgery.  He denies fever or chills.  He is unable to fully extend his right elbow.  He denies hand pain.  No difficulty with peritoneal dialysis  Allergies  Allergen Reactions  . Vancomycin Itching    Current Outpatient Medications  Medication Sig Dispense Refill  . acetaminophen (TYLENOL) 325 MG tablet Take 2 tablets (650 mg total) by mouth every 6 (six) hours as needed for mild pain (or Fever >/= 101).    Marland Kitchen amLODipine (NORVASC) 5 MG tablet Take 5 mg by mouth daily.    . calcitRIOL (ROCALTROL) 0.5 MCG capsule Take 1.5 mcg by mouth daily.    . carvedilol (COREG) 12.5 MG tablet Take 12.5 mg by mouth in the morning and at bedtime.    . cloNIDine (CATAPRES) 0.1 MG tablet Take 0.1 mg by mouth in the morning and at bedtime.    . ferric citrate (AURYXIA) 1 GM 210 MG(Fe) tablet Take 210 mg by mouth in the morning, at noon, and at bedtime.    . furosemide (LASIX) 80 MG tablet Take 80 mg by mouth daily.    Marland Kitchen oxyCODONE-acetaminophen (PERCOCET/ROXICET) 5-325 MG tablet Take 1 tablet by mouth every 6 (six) hours as needed for severe pain. 20 tablet 0  . pantoprazole (PROTONIX) 40 MG tablet Take 40 mg  by mouth daily.     No current facility-administered medications for this visit.     ROS:  See HPI  There were no vitals taken for this visit.  Physical Exam:  General appearance: Well-nourished well-developed in no apparent distress Cardiac: Rate and rhythm regular Respiratory: Nonlabored Incision: Dehisced right antecubital fossa wound.  Granulation tissue.  No signs of infection Extremities: Right hand warm and well-perfused.  2+ radial pulse.  5 out of 5 hand grip strength sensation intact Neuro: Alert and oriented x4      Assessment/Plan:  This is a 44 y.o. male who is s/p: Repair of false aneurysm to nonfunctioning left upper extremity AV fistula with nonreversed saphenous vein brachial artery replacement.  His edema is improved but not yet fully resolved.  Dehiscence of incision.  Discussed with Dr. Donzetta Matters.  Recommend washout today in the operating room.  He had approximately half pint juice at 9 AM.  If unable to proceed today, will make plans over the weekend for washout with Dr. Elige Ko, PA-C [facility] [phone]  Clinic MD:  Donzetta Matters

## 2019-09-26 NOTE — Op Note (Signed)
    OPERATIVE REPORT  DATE OF SURGERY: 09/26/2019  PATIENT: Nathan Castaneda, 44 y.o. male MRN: [id number]  DOB: October 20, 1975  PRE-OPERATIVE DIAGNOSIS: Wound dehiscence right antecubital space  POST-OPERATIVE DIAGNOSIS:  Same  PROCEDURE: Debridement and reclosure right antecubital wound  SURGEON:  Curt Jews, M.D.  PHYSICIAN ASSISTANT: Rhyne PAC  The assistant was needed for exposure and to expedite the case  ANESTHESIA: LMA  EBL: per anesthesia record  Total I/O In: 500 [I.V.:400; IV Piggyback:100] Out: 50 [Blood:50]  BLOOD ADMINISTERED: none  DRAINS: none  SPECIMEN: none  COUNTS CORRECT:  YES  PATIENT DISPOSITION:  PACU - hemodynamically stable  PROCEDURE DETAILS: Patient was taken operating placed supine position where the area of the right arm was prepped draped you sterile fashion.  The patient had a ruptured false aneurysm at the antecubital area of an old AV access.  He had undergone emergent repair of this several weeks ago and primary closure.  He had had harvest of saphenous vein from his left ankle and had interposition of his brachial artery.  He presented to our office today and had dehiscence of his wound with fat necrosis at the base.  He is taken the operating room for exploration  The necrotic debris was debrided from the antecubital incision.  The patient had excellent granulation tissue around the circumference of the skin edges.  The fat necrosis and some slight amount of hematoma were removed.  The saphenous vein graft was in the base of the wound and there was no disruption.  The wound was irrigated with saline.  Once all necrotic debris was removed the wound was closed.  With the saphenous vein being in the base of the wound did not appear that there was any option for secondary closure.  2-0 nylon mattress sutures were used to close the incision.  Skin staples were used in the skin edge itself.  Patient maintained a radial pulse and was transferred to the  recovery room in stable condition.  He will be continued on p.o. Keflex and seen again in our office in 1 week   Rosetta Posner, M.D., [facility] 09/26/2019 3:50 PM

## 2019-09-26 NOTE — Discharge Instructions (Signed)
[facility]  Discharge Instructions  AV Fistula or Graft Surgery for Dialysis Access  Please refer to the following instructions for your post-procedure care. Your surgeon or physician assistant will discuss any changes with you.  Activity  You may drive the day following your surgery, if you are comfortable and no longer taking prescription pain medication. Resume full activity as the soreness in your incision resolves.  Bathing/Showering  You may shower after you go home. Keep your incision dry for 48 hours. Do not soak in a bathtub, hot tub, or swim until the incision heals completely. You may not shower if you have a hemodialysis catheter.  Incision Care  Clean your incision with mild soap and water after 48 hours. Pat the area dry with a clean towel. You do not need a bandage unless otherwise instructed. Do not apply any ointments or creams to your incision. You may have skin glue on your incision. Do not peel it off. It will come off on its own in about one week. Your arm may swell a bit after surgery. To reduce swelling use pillows to elevate your arm so it is above your heart. Your doctor will tell you if you need to lightly wrap your arm with an ACE bandage.  Leave bandage for 48 hours then you can remove and wash with soap and water daily and redress.  Elevate arm as tolerated.  Diet  Resume your normal diet. There are not special food restrictions following this procedure. In order to heal from your surgery, it is CRITICAL to get adequate nutrition. Your body requires vitamins, minerals, and protein. Vegetables are the best source of vitamins and minerals. Vegetables also provide the perfect balance of protein. Processed food has little nutritional value, so try to avoid this.  Medications  Resume taking all of your medications. If your incision is causing pain, you may take over-the counter pain relievers such as acetaminophen (Tylenol). If  you were prescribed a stronger pain medication, please be aware these medications can cause nausea and constipation. Prevent nausea by taking the medication with a snack or meal. Avoid constipation by drinking plenty of fluids and eating foods with high amount of fiber, such as fruits, vegetables, and grains.  Do not take Tylenol if you are taking prescription pain medications.  Follow up Your surgeon may want to see you in the office following your access surgery. If so, this will be arranged at the time of your surgery.  Please call us immediately for any of the following conditions:   Increased pain, redness, drainage (pus) from your incision site  Fever of 101 degrees or higher  Severe or worsening pain at your incision site  Hand pain or numbness.   Reduce your risk of vascular disease:   Stop smoking. If you would like help, call [facility] at 1-800-QUIT-NOW 437-570-1484) or [place] at [place] your cholesterol  Maintain a desired weight  Control your diabetes  Keep your blood pressure down  Dialysis  It will take several weeks to several months for your new dialysis access to be ready for use. Your surgeon will determine when it is okay to use it. Your nephrologist will continue to direct your dialysis. You can continue to use your Permcath until your new access is ready for use.   09/26/2019 Nathan Castaneda [id number] 30-Dec-1975  Surgeon(s): Early, Arvilla Meres, MD  Procedure(s): RIGHT UPPER EXTREMITY WOUND WASHOUT    If you have  any questions, please call the office at [phone].

## 2019-09-26 NOTE — H&P (View-Only) (Signed)
  POST OPERATIVE OFFICE NOTE    CC:  F/u for surgery; 2 weeks postop  HPI:  This is a 44 y.o. male who is s/p repair of false aneurysm of right upper extremity arteriovenous fistula with repair of brachial artery using left saphenous vein.  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  The fistula had not been used for some time as he was receiving peritoneal dialysis.  He had sudden worsening enlargement of the aneurysm with overlying erythema and he was taken to surgery for operative repair.  He had erythema throughout his entire arm from venous congestion and actual skin loss at the antecubital space due to the large expansion of this false aneurysm.  He presents today with reports of dehiscence of his incision. He states the wound has had this appearance since surgery.  He denies fever or chills.  He is unable to fully extend his right elbow.  He denies hand pain.  No difficulty with peritoneal dialysis  Allergies  Allergen Reactions  . Vancomycin Itching    Current Outpatient Medications  Medication Sig Dispense Refill  . acetaminophen (TYLENOL) 325 MG tablet Take 2 tablets (650 mg total) by mouth every 6 (six) hours as needed for mild pain (or Fever >/= 101).    Marland Kitchen amLODipine (NORVASC) 5 MG tablet Take 5 mg by mouth daily.    . calcitRIOL (ROCALTROL) 0.5 MCG capsule Take 1.5 mcg by mouth daily.    . carvedilol (COREG) 12.5 MG tablet Take 12.5 mg by mouth in the morning and at bedtime.    . cloNIDine (CATAPRES) 0.1 MG tablet Take 0.1 mg by mouth in the morning and at bedtime.    . ferric citrate (AURYXIA) 1 GM 210 MG(Fe) tablet Take 210 mg by mouth in the morning, at noon, and at bedtime.    . furosemide (LASIX) 80 MG tablet Take 80 mg by mouth daily.    Marland Kitchen oxyCODONE-acetaminophen (PERCOCET/ROXICET) 5-325 MG tablet Take 1 tablet by mouth every 6 (six) hours as needed for severe pain. 20 tablet 0  . pantoprazole (PROTONIX) 40 MG tablet Take 40 mg  by mouth daily.     No current facility-administered medications for this visit.     ROS:  See HPI  There were no vitals taken for this visit.  Physical Exam:  General appearance: Well-nourished well-developed in no apparent distress Cardiac: Rate and rhythm regular Respiratory: Nonlabored Incision: Dehisced right antecubital fossa wound.  Granulation tissue.  No signs of infection Extremities: Right hand warm and well-perfused.  2+ radial pulse.  5 out of 5 hand grip strength sensation intact Neuro: Alert and oriented x4      Assessment/Plan:  This is a 44 y.o. male who is s/p: Repair of false aneurysm to nonfunctioning left upper extremity AV fistula with nonreversed saphenous vein brachial artery replacement.  His edema is improved but not yet fully resolved.  Dehiscence of incision.  Discussed with Dr. Donzetta Matters.  Recommend washout today in the operating room.  He had approximately half pint juice at 9 AM.  If unable to proceed today, will make plans over the weekend for washout with Dr. Elige Ko, PA-C [facility] [phone]  Clinic MD:  Donzetta Matters

## 2019-09-26 NOTE — Anesthesia Preprocedure Evaluation (Addendum)
Anesthesia Evaluation  Patient identified by MRN, date of birth, ID band Patient awake    Reviewed: Allergy & Precautions, NPO status , Patient's Chart, lab work & pertinent test results  Airway Mallampati: II  TM Distance: >3 FB Neck ROM: Full    Dental no notable dental hx. (+) Teeth Intact, Dental Advisory Given,    Pulmonary Current SmokerPatient did not abstain from smoking.,    Pulmonary exam normal breath sounds clear to auscultation       Cardiovascular hypertension, Pt. on medications and Pt. on home beta blockers Normal cardiovascular exam Rhythm:Regular Rate:Normal     Neuro/Psych negative neurological ROS  negative psych ROS   GI/Hepatic negative GI ROS, Neg liver ROS,   Endo/Other  negative endocrine ROS  Renal/GU ESRF and DialysisRenal diseasePeritoneal dialysis daily  K+ 4.1 Cr 17.5     Musculoskeletal negative musculoskeletal ROS (+)   Abdominal   Peds  Hematology  (+) anemia , Hgb 9.5   Anesthesia Other Findings   Reproductive/Obstetrics                             Anesthesia Physical Anesthesia Plan  ASA: III  Anesthesia Plan: General   Post-op Pain Management:    Induction: Intravenous  PONV Risk Score and Plan: 3 and Treatment may vary due to age or medical condition, Ondansetron and Dexamethasone  Airway Management Planned: LMA  Additional Equipment: None  Intra-op Plan:   Post-operative Plan:   Informed Consent: I have reviewed the patients History and Physical, chart, labs and discussed the procedure including the risks, benefits and alternatives for the proposed anesthesia with the patient or authorized representative who has indicated his/her understanding and acceptance.     Dental advisory given  Plan Discussed with:   Anesthesia Plan Comments:        Anesthesia Quick Evaluation

## 2019-09-26 NOTE — Interval H&P Note (Signed)
History and Physical Interval Note:  09/26/2019 2:13 PM  Nathan Castaneda  has presented today for surgery, with the diagnosis of RIGHT UPPER EXTREMITY WOUND DEHISCENCE.  The various methods of treatment have been discussed with the patient and family. After consideration of risks, benefits and other options for treatment, the patient has consented to  Procedure(s): RIGHT UPPER EXTREMITY WOUND WASHOUT (Right) as a surgical intervention.  The patient's history has been reviewed, patient examined, no change in status, stable for surgery.  I have reviewed the patient's chart and labs.  Questions were answered to the patient's satisfaction.     Curt Jews

## 2019-09-26 NOTE — Transfer of Care (Signed)
Immediate Anesthesia Transfer of Care Note  Patient: Nathan Castaneda  Procedure(s) Performed: RIGHT UPPER EXTREMITY WOUND WASHOUT (Right Arm Upper)  Patient Location: PACU  Anesthesia Type:General  Level of Consciousness: drowsy and patient cooperative  Airway & Oxygen Therapy: Patient Spontanous Breathing and Patient connected to face mask oxygen  Post-op Assessment: Report given to RN and Post -op Vital signs reviewed and stable  Post vital signs: Reviewed and stable  Last Vitals:  Vitals Value Taken Time  BP 129/87 09/26/19 1550  Temp    Pulse 88 09/26/19 1550  Resp 9 09/26/19 1550  SpO2 100 % 09/26/19 1550  Vitals shown include unvalidated device data.  Last Pain:  Vitals:   09/26/19 1248  TempSrc:   PainSc: 3       Patients Stated Pain Goal: 2 (73/73/66 8159)  Complications: No complications documented.

## 2019-09-27 ENCOUNTER — Other Ambulatory Visit: Payer: Self-pay | Admitting: Physician Assistant

## 2019-09-27 MED ORDER — CEPHALEXIN 500 MG PO CAPS: 500.0000 mg | ORAL_CAPSULE | Freq: Every day | ORAL | 0 refills | Status: DC

## 2019-09-27 NOTE — Progress Notes (Signed)
Called pt to inform him that Keflex has been sent to his pharmacy.  He stated he was a little sore.  Stated his wrist was sore but able to flex and move his hand.  He did not think the bandage was too tight.  Discussed that we did not operate around that area and were not in the vicinity of any nerves during the operation.  Discussed with him to elevate his arm and continue to monitor.  He will have f/u appt next Friday but will call sooner if he has any issues before then.    Keflex 561m daily x 7 days sent to pharmacy.   SLeontine Locket PTrego [facility]9/18/2021 9:29 AM

## 2019-09-28 NOTE — Anesthesia Postprocedure Evaluation (Signed)
Anesthesia Post Note  Patient: Catering manager) Performed: RIGHT UPPER EXTREMITY WOUND WASHOUT (Right Arm Upper)     Patient location during evaluation: PACU Anesthesia Type: General Level of consciousness: awake and alert Pain management: pain level controlled Vital Signs Assessment: post-procedure vital signs reviewed and stable Respiratory status: spontaneous breathing, nonlabored ventilation, respiratory function stable and patient connected to nasal cannula oxygen Cardiovascular status: blood pressure returned to baseline and stable Postop Assessment: no apparent nausea or vomiting Anesthetic complications: no   No complications documented.  Last Vitals:  Vitals:   09/26/19 1621 09/26/19 1636  BP: (!) 137/96 (!) 144/99  Pulse: 78 75  Resp: 13 11  Temp:  36.4 C  SpO2: 100% 100%    Last Pain:  Vitals:   09/26/19 1636  TempSrc:   PainSc: 0-No pain                 Barnet Glasgow

## 2019-09-29 ENCOUNTER — Encounter (HOSPITAL_COMMUNITY): Payer: Self-pay | Admitting: Vascular Surgery

## 2019-10-03 NOTE — Progress Notes (Deleted)
  POST OPERATIVE OFFICE NOTE    CC:  F/u for surgery  HPI:  This is a 44 y.o. male  patient has an unusual history.  Had history of hemodialysis via right arm in [place] in the past.  He currently is on peritoneal dialysis.  He had presented for anemia and epistaxis and nausea and vomiting approximately 7 to 10 days ago.  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  Patient reportedly been years since this has been used.   He underwent 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 artery with reversed saphenous vein from left ankle on 09/12/2019 by Dr. Donnetta Hutching.   He was seen in f/u on 09/26/19 with dehiscence of his incision.  He returned to the OR with Dr. Donnetta Hutching for debridement and reclosure of the [facility] incision.  Keflex 563m daily x 7 days sent to pharmacy.     Pt returns today for follow up.    Allergies  Allergen Reactions  . Vancomycin Itching    Current Outpatient Medications  Medication Sig Dispense Refill  . acetaminophen (TYLENOL) 325 MG tablet Take 2 tablets (650 mg total) by mouth every 6 (six) hours as needed for mild pain (or Fever >/= 101).    .Marland KitchenamLODipine (NORVASC) 5 MG tablet Take 5 mg by mouth daily.    . calcitRIOL (ROCALTROL) 0.5 MCG capsule Take 1.5 mcg by mouth daily.    . carvedilol (COREG) 12.5 MG tablet Take 12.5 mg by mouth in the morning and at bedtime.    . cephALEXin (KEFLEX) 500 MG capsule Take 1 capsule (500 mg total) by mouth daily. 7 capsule 0  . cloNIDine (CATAPRES) 0.1 MG tablet Take 0.1 mg by mouth in the morning and at bedtime.    . ferric citrate (AURYXIA) 1 GM 210 MG(Fe) tablet Take 210 mg by mouth in the morning, at noon, and at bedtime.    . furosemide (LASIX) 80 MG tablet Take 80 mg by mouth daily.    .Marland KitchenoxyCODONE-acetaminophen (PERCOCET/ROXICET) 5-325 MG tablet Take 1 tablet by mouth every 6 (six) hours as needed for severe pain. 20 tablet 0  . pantoprazole (PROTONIX) 40 MG  tablet Take 40 mg by mouth daily.     No current facility-administered medications for this visit.     ROS:  See HPI  Physical Exam:  ***  Incision:  *** Extremities:  *** Neuro: *** Abdomen:  ***  Assessment/Plan:  This is a 44y.o. male who is s/p: ***  -***  *** [facility] [phone] Clinic MD:  ***

## 2019-10-06 ENCOUNTER — Ambulatory Visit (INDEPENDENT_AMBULATORY_CARE_PROVIDER_SITE_OTHER): Payer: Self-pay | Admitting: Physician Assistant

## 2019-10-06 ENCOUNTER — Other Ambulatory Visit: Payer: Self-pay

## 2019-10-06 VITALS — BP 186/110 | HR 91 | Temp 99.0°F | Resp 20 | Ht 70.0 in | Wt 172.6 lb

## 2019-10-06 DIAGNOSIS — T8131XA Disruption of external operation (surgical) wound, not elsewhere classified, initial encounter: Secondary | ICD-10-CM

## 2019-10-06 MED ORDER — CEPHALEXIN 500 MG PO CAPS
500.0000 mg | ORAL_CAPSULE | Freq: Every day | ORAL | 0 refills | Status: DC
Start: 2019-10-06 — End: 2021-03-31

## 2019-10-06 NOTE — Progress Notes (Signed)
POST OPERATIVE OFFICE NOTE    CC:  F/u for surgery  HPI:  This is a 44 y.o. male who is s/p Debridement and reclosure right antecubital wound on 09/26/2019 by Dr. Donnetta Hutching.  He previously underwent exploration of false aneurysm and replacement of right brachial artery with reversed saphenous vein from the left ankle on 09/12/2019 by Dr. Donnetta Hutching.   On 9/17, pt was discharged home with instructions to return in one week for wound check.  He was sent with one week of Keflex.  He was instructed to elevate his arm for swelling.  His sutures to remain in for at least 2-3 weeks.    Pt returns today for follow up.  He states he still has some swelling in his arm.  He states that a staple fell out.   He does not have any pain in his hand.  He denies any fever or chills.    Allergies  Allergen Reactions  . Vancomycin Itching    Current Outpatient Medications  Medication Sig Dispense Refill  . acetaminophen (TYLENOL) 325 MG tablet Take 2 tablets (650 mg total) by mouth every 6 (six) hours as needed for mild pain (or Fever >/= 101).    Marland Kitchen amLODipine (NORVASC) 5 MG tablet Take 5 mg by mouth daily.    . calcitRIOL (ROCALTROL) 0.5 MCG capsule Take 1.5 mcg by mouth daily.    . carvedilol (COREG) 12.5 MG tablet Take 12.5 mg by mouth in the morning and at bedtime.    . cephALEXin (KEFLEX) 500 MG capsule Take 1 capsule (500 mg total) by mouth daily. 7 capsule 0  . cloNIDine (CATAPRES) 0.1 MG tablet Take 0.1 mg by mouth in the morning and at bedtime.    . ferric citrate (AURYXIA) 1 GM 210 MG(Fe) tablet Take 210 mg by mouth in the morning, at noon, and at bedtime.    . furosemide (LASIX) 80 MG tablet Take 80 mg by mouth daily.    Marland Kitchen oxyCODONE-acetaminophen (PERCOCET/ROXICET) 5-325 MG tablet Take 1 tablet by mouth every 6 (six) hours as needed for severe pain. 20 tablet 0  . pantoprazole (PROTONIX) 40 MG tablet Take 40 mg by mouth daily.     No current facility-administered medications for this visit.     ROS:   See HPI  Physical Exam:  Today's Vitals   10/06/19 1319  BP: (!) 186/110  Pulse: 91  Resp: 20  Temp: 99 F (37.2 C)  TempSrc: Temporal  SpO2: 95%  Weight: 172 lb 9.6 oz (78.3 kg)  Height: 5' 10"  (1.778 m)   Body mass index is 24.77 kg/m.   Incision:      Extremities:  Easily palpable right radial and ulnar pulses.  Strong hand grip.  Motor and sensory are in tact.    Assessment/Plan:  This is a 44 y.o. male who is s/p: Debridement and reclosure right antecubital wound on 09/26/2019 by Dr. Donnetta Hutching.  He previously underwent exploration of false aneurysm and replacement of right brachial artery with reversed saphenous vein from the left ankle on 09/12/2019 by Dr. Donnetta Hutching.    -discussed pt with Dr. Donnetta Hutching.  Staples removed and nylon suture left in.  He did have some murky drainage from the wound and I did culture this.  He has not had any fever or chills.   I am sending another week of Keflex to his pharmacy.  I will call him if the cx comes back with anything different.  He will do wet to dry  dressing changes twice a day.  He is taught how to do this.  He will f/u in 2 weeks.  He will call sooner if there are any issues.    Leontine Locket, [facility] [phone]  Clinic MD:  Trula Slade

## 2019-10-06 NOTE — Addendum Note (Signed)
Addended byDoylene Bode on: 10/06/2019 03:11 PM   Modules accepted: Orders

## 2019-10-20 ENCOUNTER — Ambulatory Visit (INDEPENDENT_AMBULATORY_CARE_PROVIDER_SITE_OTHER): Payer: Self-pay | Admitting: Physician Assistant

## 2019-10-20 ENCOUNTER — Other Ambulatory Visit: Payer: Self-pay

## 2019-10-20 VITALS — BP 213/123 | HR 84 | Temp 98.0°F | Resp 20 | Ht 70.0 in | Wt 165.9 lb

## 2019-10-20 DIAGNOSIS — T8131XA Disruption of external operation (surgical) wound, not elsewhere classified, initial encounter: Secondary | ICD-10-CM

## 2019-10-20 NOTE — Progress Notes (Signed)
  POST OPERATIVE DIALYSIS ACCESS OFFICE NOTE    CC:  F/u for dialysis access surgery  HPI:  This is a 44 y.o. 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  At follow-up, his right antecubital fossa incision had separated and he was taken to surgery 917/2021 for washout and closure by Dr. Donzetta Matters.  He performs peritoneal dialysis at home.  He denies difficulty with dialysis, fever or chills.  No hand or incisional pain.   Allergies  Allergen Reactions  . Vancomycin Itching    Current Outpatient Medications  Medication Sig Dispense Refill  . acetaminophen (TYLENOL) 325 MG tablet Take 2 tablets (650 mg total) by mouth every 6 (six) hours as needed for mild pain (or Fever >/= 101).    Marland Kitchen amLODipine (NORVASC) 5 MG tablet Take 5 mg by mouth daily.    . calcitRIOL (ROCALTROL) 0.25 MCG capsule Take 0.25 mcg by mouth daily.    . carvedilol (COREG) 12.5 MG tablet Take 12.5 mg by mouth in the morning and at bedtime.    . cephALEXin (KEFLEX) 500 MG capsule Take 1 capsule (500 mg total) by mouth daily. 7 capsule 0  . cloNIDine (CATAPRES) 0.1 MG tablet Take 0.1 mg by mouth in the morning and at bedtime.    . ferric citrate (AURYXIA) 1 GM 210 MG(Fe) tablet Take 210 mg by mouth in the morning, at noon, and at bedtime.    . furosemide (LASIX) 80 MG tablet Take 80 mg by mouth daily.    Marland Kitchen oxyCODONE-acetaminophen (PERCOCET/ROXICET) 5-325 MG tablet Take 1 tablet by mouth every 6 (six) hours as needed for severe pain. 20 tablet 0  . pantoprazole (PROTONIX) 40 MG tablet Take 40 mg by mouth daily.     No current facility-administered medications for this visit.     ROS:  See HPI  BP (!) 213/123 (BP Location: Left Arm, Patient Position: Sitting, Cuff Size: Normal)   Pulse 84   Temp 98 F (36.7 C) (Temporal)   Resp 20    Ht 5' 10"  (1.778 m)   Wt 165 lb 14.4 oz (75.3 kg)   SpO2 99%   BMI 23.80 kg/m    Physical Exam:  General appearance: Well-developed, well-nourished in no apparent distress Cardiac: Rate and rhythm are regular Respiratory: Nonlabored Incision: Well healed. Extremities: Moves all well.  Right hand warm with 2+ radial pulse  Assessment/Plan: 44 year old male with history of end-stage renal disease who developed edema and inflammation over aneurysmal dilatation of a nonfunctioning right upper extremity fistula.  This was repaired and he developed wound dehiscence postoperatively.  His wound was washed out and closed primarily with nylon sutures.  Today, his incision is well-healed and all sutures removed.  He continues on peritoneal dialysis.  He will follow-up as needed   -Barbie Banner, PA-C 10/20/2019 12:09 PM [facility] [phone]  Clinic MD: Trula Slade

## 2020-01-07 DIAGNOSIS — Z720 Tobacco use: Secondary | ICD-10-CM | POA: Insufficient documentation

## 2020-01-07 DIAGNOSIS — M329 Systemic lupus erythematosus, unspecified: Secondary | ICD-10-CM | POA: Insufficient documentation

## 2020-01-07 DIAGNOSIS — Z992 Dependence on renal dialysis: Secondary | ICD-10-CM | POA: Insufficient documentation

## 2020-01-07 DIAGNOSIS — K5010 Crohn's disease of large intestine without complications: Secondary | ICD-10-CM | POA: Insufficient documentation

## 2020-01-07 DIAGNOSIS — Z01818 Encounter for other preprocedural examination: Secondary | ICD-10-CM | POA: Insufficient documentation

## 2020-03-08 ENCOUNTER — Other Ambulatory Visit: Payer: Self-pay

## 2020-03-08 DIAGNOSIS — N186 End stage renal disease: Secondary | ICD-10-CM

## 2020-03-09 ENCOUNTER — Encounter (HOSPITAL_COMMUNITY): Payer: Medicare HMO

## 2020-03-09 ENCOUNTER — Other Ambulatory Visit (HOSPITAL_COMMUNITY): Payer: Medicare HMO

## 2020-03-09 ENCOUNTER — Ambulatory Visit: Payer: Medicare HMO | Admitting: Vascular Surgery

## 2020-03-18 DIAGNOSIS — N2889 Other specified disorders of kidney and ureter: Secondary | ICD-10-CM | POA: Insufficient documentation

## 2020-03-18 DIAGNOSIS — R399 Unspecified symptoms and signs involving the genitourinary system: Secondary | ICD-10-CM | POA: Insufficient documentation

## 2020-03-18 DIAGNOSIS — N1330 Unspecified hydronephrosis: Secondary | ICD-10-CM | POA: Insufficient documentation

## 2020-05-18 ENCOUNTER — Other Ambulatory Visit: Payer: Self-pay

## 2020-05-18 DIAGNOSIS — N186 End stage renal disease: Secondary | ICD-10-CM

## 2020-06-09 ENCOUNTER — Other Ambulatory Visit (HOSPITAL_COMMUNITY): Payer: Medicare HMO

## 2020-06-09 ENCOUNTER — Ambulatory Visit: Payer: Medicare HMO | Admitting: Vascular Surgery

## 2020-06-09 ENCOUNTER — Ambulatory Visit (HOSPITAL_COMMUNITY): Payer: Medicare HMO | Attending: Vascular Surgery

## 2020-12-15 ENCOUNTER — Other Ambulatory Visit: Payer: Self-pay

## 2020-12-15 ENCOUNTER — Emergency Department (HOSPITAL_COMMUNITY)
Admission: EM | Admit: 2020-12-15 | Discharge: 2020-12-15 | Disposition: A | Discharge status: Home / Self Care | Payer: Medicare Other | Attending: Emergency Medicine | Admitting: Emergency Medicine

## 2020-12-15 ENCOUNTER — Encounter (HOSPITAL_COMMUNITY): Payer: Self-pay

## 2020-12-15 ENCOUNTER — Emergency Department (HOSPITAL_COMMUNITY): Payer: Medicare Other

## 2020-12-15 DIAGNOSIS — F981 Encopresis not due to a substance or known physiological condition: Secondary | ICD-10-CM | POA: Insufficient documentation

## 2020-12-15 DIAGNOSIS — L299 Pruritus, unspecified: Secondary | ICD-10-CM | POA: Insufficient documentation

## 2020-12-15 DIAGNOSIS — I132 Hypertensive heart and chronic kidney disease with heart failure and with stage 5 chronic kidney disease, or end stage renal disease: Secondary | ICD-10-CM | POA: Diagnosis not present

## 2020-12-15 DIAGNOSIS — Z79899 Other long term (current) drug therapy: Secondary | ICD-10-CM | POA: Insufficient documentation

## 2020-12-15 DIAGNOSIS — F17210 Nicotine dependence, cigarettes, uncomplicated: Secondary | ICD-10-CM | POA: Insufficient documentation

## 2020-12-15 DIAGNOSIS — K6289 Other specified diseases of anus and rectum: Secondary | ICD-10-CM

## 2020-12-15 DIAGNOSIS — R748 Abnormal levels of other serum enzymes: Secondary | ICD-10-CM

## 2020-12-15 DIAGNOSIS — N186 End stage renal disease: Secondary | ICD-10-CM | POA: Insufficient documentation

## 2020-12-15 DIAGNOSIS — I509 Heart failure, unspecified: Secondary | ICD-10-CM | POA: Insufficient documentation

## 2020-12-15 DIAGNOSIS — Z992 Dependence on renal dialysis: Secondary | ICD-10-CM | POA: Diagnosis not present

## 2020-12-15 LAB — COMPREHENSIVE METABOLIC PANEL
ALT: <5 U/L (ref 0–44)
AST: 18 U/L (ref 15–41)
Albumin: 2.2 g/dL — ABNORMAL LOW (ref 3.5–5.0)
Alkaline Phosphatase: 96 U/L (ref 38–126)
Anion gap: 13 (ref 5–15)
BUN: 67 mg/dL — ABNORMAL HIGH (ref 6–20)
CO2: 25 mmol/L (ref 22–32)
Calcium: 8.7 mg/dL — ABNORMAL LOW (ref 8.9–10.3)
Chloride: 94 mmol/L — ABNORMAL LOW (ref 98–111)
Creatinine, Ser: 18.72 mg/dL — ABNORMAL HIGH (ref 0.61–1.24)
GFR, Estimated: 3 mL/min — ABNORMAL LOW (ref >60)
Glucose, Bld: 103 mg/dL — ABNORMAL HIGH (ref 70–99)
Potassium: 3.6 mmol/L (ref 3.5–5.1)
Sodium: 132 mmol/L — ABNORMAL LOW (ref 135–145)
Total Bilirubin: 0.5 mg/dL (ref 0.3–1.2)
Total Protein: 6.8 g/dL (ref 6.5–8.1)

## 2020-12-15 LAB — CBC WITH DIFFERENTIAL/PLATELET
Abs Immature Granulocytes: 0.17 10*3/uL — ABNORMAL HIGH (ref 0.00–0.07)
Basophils Absolute: 0 10*3/uL (ref 0.0–0.1)
Basophils Relative: 0 %
Eosinophils Absolute: 0.1 10*3/uL (ref 0.0–0.5)
Eosinophils Relative: 2 %
HCT: 23 % — ABNORMAL LOW (ref 39.0–52.0)
Hemoglobin: 7.6 g/dL — ABNORMAL LOW (ref 13.0–17.0)
Immature Granulocytes: 2 %
Lymphocytes Relative: 14 %
Lymphs Abs: 1.3 10*3/uL (ref 0.7–4.0)
MCH: 29.5 pg (ref 26.0–34.0)
MCHC: 33 g/dL (ref 30.0–36.0)
MCV: 89.1 fL (ref 80.0–100.0)
Monocytes Absolute: 0.5 10*3/uL (ref 0.1–1.0)
Monocytes Relative: 6 %
Neutro Abs: 7.1 10*3/uL (ref 1.7–7.7)
Neutrophils Relative %: 76 %
Platelets: 192 10*3/uL (ref 150–400)
RBC: 2.58 MIL/uL — ABNORMAL LOW (ref 4.22–5.81)
RDW: 15.3 % (ref 11.5–15.5)
WBC: 9.2 10*3/uL (ref 4.0–10.5)
nRBC: 0 % (ref 0.0–0.2)

## 2020-12-15 LAB — LIPASE, BLOOD: Lipase: 755 U/L — ABNORMAL HIGH (ref 11–51)

## 2020-12-15 MED ORDER — GERHARDT'S BUTT CREAM
TOPICAL_CREAM | Freq: Three times a day (TID) | CUTANEOUS | Status: DC
  Filled 2020-12-15: qty 1

## 2020-12-15 NOTE — ED Notes (Signed)
Patient discharge instructions reviewed with the patient. Patient verbalized understanding. Social work to see patient in lobby to arrange [place] Po[place] ride home.

## 2020-12-15 NOTE — ED Notes (Signed)
Nephrology paged to [place] per her request

## 2020-12-15 NOTE — ED Provider Notes (Addendum)
[facility] DEPARTMENT Provider Note   CSN: [id number] Arrival date & time: 12/15/20  0040     History Chief Complaint  Patient presents with   Encopresis    Nathan Castaneda is a 45 y.o. male.  Patient with a complicated medical history including peritoneal dialysis with recent peritonitis, SLE, Crohn's, chronic hydronephrosis, recent hospitalizations at [facility], 11/16 for peritonitis signing out [place] on 11/23; he subsequently returned to the hospital, was admitted and discharged on 12/5 with home care set up with the [email or web address]  Program. The patient presents here with complaint that his rectal area and groin area are raw from frequent defecation.   The history is provided by the patient. No language interpreter was used.      Past Medical History:  Diagnosis Date   Anemia    History of recent blood transfusion    Hypertension    Lupus ([place])    Renal disease     Patient Active Problem List   Diagnosis Date Noted   Bilateral hydronephrosis 03/18/2020   Left renal mass 03/18/2020   Lower urinary tract symptoms (LUTS) 03/18/2020   Crohn's disease of colon without complication ([place]) 54/49/2010   Peritoneal dialysis catheter in place [facility]) 01/07/2020   Pre-transplant evaluation for kidney transplant 01/07/2020   SLE (systemic lupus erythematosus) ([place]) 01/07/2020   Tobacco abuse 01/07/2020   Hypertension    Anemia of chronic disease    Hyponatremia    False aneurysm of artery (HCC)    Acute on chronic anemia 09/02/2019   ESRD (end stage renal disease) ([place]) 09/02/2019   Hypertensive urgency 09/02/2019   Nausea & vomiting 09/02/2019   Epistaxis 09/02/2019   Headache 04/15/2018   CHF (congestive heart failure) ([place]) 01/18/2018    Past Surgical History:  Procedure Laterality Date   AV FISTULA PLACEMENT     PERITONEAL CATHETER INSERTION     REVISON OF ARTERIOVENOUS FISTULA Right 09/12/2019   Procedure: Exploration of false  aneurysm and replacement of right brachial artery with reversed saphenous vein from left ankle;  Surgeon: Rosetta Posner, MD;  Location: [facility] OR;  Service: Vascular;  Laterality: Right;   VEIN HARVEST Left 09/12/2019   Procedure: [place];  Surgeon: Rosetta Posner, MD;  Location: [facility] OR;  Service: Vascular;  Laterality: Left;   WOUND DEBRIDEMENT Right 09/26/2019   Procedure: RIGHT UPPER EXTREMITY WOUND WASHOUT;  Surgeon: Rosetta Posner, MD;  Location: [place];  Service: Vascular;  Laterality: Right;       History reviewed. No pertinent family history.  Social History   Tobacco Use   Smoking status: Some Days    Types: Cigarettes   Smokeless tobacco: Never  Vaping Use   Vaping Use: Never used  Substance Use Topics   Alcohol use: Never   Drug use: Never    Home Medications Prior to Admission medications   Medication Sig Start Date End Date Taking? Authorizing Provider  acetaminophen (TYLENOL) 325 MG tablet Take 2 tablets (650 mg total) by mouth every 6 (six) hours as needed for mild pain (or Fever >/= 101). 09/14/19   Domenic Polite, MD  amLODipine (NORVASC) 5 MG tablet Take 5 mg by mouth daily. 05/14/18   [provider]  calcitRIOL (ROCALTROL) 0.25 MCG capsule Take 0.25 mcg by mouth daily. 08/20/19   [provider]  carvedilol (COREG) 12.5 MG tablet Take 12.5 mg by mouth in the morning and at bedtime. 06/19/18   [provider]  cephALEXin (KEFLEX) 500 MG capsule Take 1 capsule (500 mg total) by mouth daily. 10/06/19   Rhyne, Hulen Shouts, PA-C  cloNIDine (CATAPRES) 0.1 MG tablet Take 0.1 mg by mouth in the morning and at bedtime. 10/11/17   [provider]  ferric citrate (AURYXIA) 1 GM 210 MG(Fe) tablet Take 210 mg by mouth in the morning, at noon, and at bedtime.    [provider]  furosemide (LASIX) 80 MG tablet Take 80 mg by mouth daily. 02/04/18   [provider]  oxyCODONE-acetaminophen (PERCOCET/ROXICET) 5-325 MG tablet Take  1 tablet by mouth every 6 (six) hours as needed for severe pain. 09/26/19   Rhyne, Hulen Shouts, PA-C  pantoprazole (PROTONIX) 40 MG tablet Take 40 mg by mouth daily. 04/17/18   [provider]    Allergies    Vancomycin  Review of Systems   Review of Systems  Constitutional:  Negative for chills and fever.  HENT: Negative.    Respiratory: Negative.    Cardiovascular: Negative.   Gastrointestinal:  Positive for rectal pain.  Genitourinary:        Painful rash to groin.  Musculoskeletal: Negative.   Skin: Negative.        See HPI.  Neurological: Negative.    Physical Exam Updated Vital Signs BP 128/72   Pulse 100   Temp 98.5 F (36.9 C) (Oral)   Resp 16   Ht 5' 10"  (1.778 m)   Wt 76 kg   SpO2 100%   BMI 24.04 kg/m   Physical Exam Vitals and nursing note reviewed.  Constitutional:      Appearance: He is well-developed.  HENT:     Head: Normocephalic.     Mouth/Throat:     Mouth: Mucous membranes are moist.  Cardiovascular:     Rate and Rhythm: Normal rate and regular rhythm.     Heart sounds: No murmur heard. Pulmonary:     Effort: Pulmonary effort is normal.     Breath sounds: Normal breath sounds. No wheezing, rhonchi or rales.  Abdominal:     General: Bowel sounds are normal.     Palpations: Abdomen is soft.     Tenderness: There is no abdominal tenderness. There is no guarding or rebound.     Comments: Peritoneal dialysis apparatus present, site unremarkable. Abdomen is soft, non-tender, non-distended.    Genitourinary:    Comments: Penis unremarkable, no swelling, redness or discharge. There is no scrotal swelling or testicular tenderness. There is redness of the scrotal skin and perirectal area without bleeding, discharge. No stool present.  Musculoskeletal:        General: Normal range of motion.     Cervical back: Normal range of motion and neck supple.  Skin:    General: Skin is warm and dry.  Neurological:     General: No focal deficit present.      Mental Status: He is alert and oriented to person, place, and time.    ED Results / Procedures / Treatments   Labs (all labs ordered are listed, but only abnormal results are displayed) Labs Reviewed  CBC WITH DIFFERENTIAL/PLATELET - Abnormal; Notable for the following components:      Result Value   RBC 2.58 (*)    Hemoglobin 7.6 (*)    HCT 23.0 (*)    Abs Immature Granulocytes 0.17 (*)    All other components within normal limits  COMPREHENSIVE METABOLIC PANEL - Abnormal; Notable for the following components:   Sodium 132 (*)  Chloride 94 (*)    Glucose, Bld 103 (*)    BUN 67 (*)    Creatinine, Ser 18.72 (*)    Calcium 8.7 (*)    Albumin 2.2 (*)    GFR, Estimated 3 (*)    All other components within normal limits  LIPASE, BLOOD - Abnormal; Notable for the following components:   Lipase 755 (*)    All other components within normal limits    EKG None  Radiology No results found.  Procedures Procedures   Medications Ordered in ED Medications  Gerhardt's butt cream (has no administration in time range)    ED Course  I have reviewed the triage vital signs and the nursing notes.  Pertinent labs & imaging results that were available during my care of the patient were reviewed by me and considered in my medical decision making (see chart for details).    MDM Rules/Calculators/A&P                           Patient to ED with c/o skin soreness to groin and rectum as per HPI.   Recent hospitalization for peritonitis. No abdominal distension or tenderness. Doubt recurrence.   Recent hospitalization for hematuria, chronic hydronephrosis. Discharged 12/5 with urology follow up in place.   Will provide instructions for self care and hygiene. Will provide barrier cream for comfort. He is encouraged to take advantage of the in home care being provided.   ADDENDUM: On final lab review at discharge, lipase ordered through the triage process is found to be elevated  to 755. No comparable studies available. The patient has a benign abdominal exam and reports no abdominal pain. Unknown significance. CT ordered to evaluate for pancreatic inflammation. If no inflammation, with benign abdomen, patient is felt appropriate for discharge home.   He is a participant in the [facility]  program who can follow up on lab abnormality if discharged home.   Final Clinical Impression(s) / ED Diagnoses Final diagnoses:  None   Skin irritation Elevated Lipase  Rx / DC Orders ED Discharge Orders     None        Charlann Lange, PA-C 12/15/20 0653    Charlann Lange, PA-C 12/15/20 [phone]    Orpah Greek, MD 12/19/20 [phone]

## 2020-12-15 NOTE — ED Provider Notes (Signed)
  Physical Exam  BP 105/77   Pulse 100   Temp 97.9 F (36.6 C) (Oral)   Resp 18   Ht 5' 10"  (1.778 m)   Wt 76 kg   SpO2 100%   BMI 24.04 kg/m   Physical Exam Vitals and nursing note reviewed.  Constitutional:      General: He is not in acute distress.    Appearance: He is well-developed.  HENT:     Head: Normocephalic and atraumatic.  Eyes:     Conjunctiva/sclera: Conjunctivae normal.  Cardiovascular:     Rate and Rhythm: Normal rate and regular rhythm.     Heart sounds: No murmur heard. Pulmonary:     Effort: Pulmonary effort is normal. No respiratory distress.     Breath sounds: Normal breath sounds.  Abdominal:     Palpations: Abdomen is soft.     Tenderness: There is no abdominal tenderness.     Comments: Peritoneal dialysis catheter in place with no erythema or purulent drainage  Musculoskeletal:        General: No swelling.     Cervical back: Neck supple.  Skin:    General: Skin is warm and dry.     Capillary Refill: Capillary refill takes less than 2 seconds.  Neurological:     Mental Status: He is alert.  Psychiatric:        Mood and Affect: Mood normal.    ED Course/Procedures     Procedures  MDM  Patient received in handoff.  Presenting with perirectal skin inflammation in the setting of diarrhea and hematuria after recent ureteral stent placement and discharged from [facility] yesterday.  Elevated lipase to 755.  Pending CT at time of signout.  The CT does not show any evidence of pancreatic inflammation does show evidence of mild pneumoperitoneum which is likely secondary to resolving peritonitis versus recent instrumentation of his stent placement.  On reevaluation of his abdominal exam he has not no abdominal tenderness, no persistent vomiting, no complaints of abdominal pain and I have low suspicion for pancreatitis at this time.  I spoke with the patient's hospitalist at home team specifically Melene Muller APP who will facilitate repeat lipase  testing to ensure downtrending laboratory evaluation.  Patient has follow-up with this hospice at home team tomorrow and urology appointment for tomorrow.  Patient tolerated p.o. without difficulty in the emergency department was discharged home.       Teressa Lower, MD 12/15/20 [phone]

## 2020-12-15 NOTE — ED Triage Notes (Signed)
Pt BIB [facility] for eval of bowel incontinence. Pt was at [facility] for abd infection and kidney stone. Pt w/ hx of peritoneal dialysis, completing session on EMS arrival. Left AMA yesterday because they werent "treating him right". Pt called out EMS for diarrhea so severe that he is unable to make it to bathroom and has pooped his pants several times this evening. Pt also reports chafing in his groin that is painful.

## 2020-12-15 NOTE — ED Notes (Signed)
Patient hook to bp and pulse ox

## 2020-12-15 NOTE — ED Provider Notes (Signed)
Emergency Medicine Provider Triage Evaluation Note  Nathan Castaneda , a 45 y.o. male  was evaluated in triage.  Pt complains of incontinence.  Was in [facility] for kidney stones and abdominal infection and AMA'd last evening because "they were not treating him right".  Had been having lots of diarrhea, now having "chaffing" due to numerous accidents as he is not able to make it to the bathroom in time.  He is ESRD, on PD dialysis at home and got most of his treatment today.  Has had ongoing hematuria from recent procedure with urology-- was told this was normal.  Not currently on antibiotics.  Review of Systems  Positive: Diarrhea,  Negative: fever  Physical Exam  BP 120/77 (BP Location: Right Arm)   Pulse (!) 105   Temp 99 F (37.2 C) (Oral)   Resp 16   SpO2 100%   Gen:   Awake, no distress Resp:  Normal effort  MSK:   Moves extremities without difficulty  Other:    Medical Decision Making  Medically screening exam initiated at 12:42 AM.  Appropriate orders placed.  Nathan Castaneda was informed that the remainder of the evaluation will be completed by another provider, this initial triage assessment does not replace that evaluation, and the importance of remaining in the ED until their evaluation is complete.  Recent hospitalization for abdominal infection and kidney stones at [facility] but signed out AMA.   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  Body fluid cultures were negative for SBP.  Has had continued diarrhea, not currently on abx.  Denies bloody stools but blood in urine which he was told was normal.  Will send labs, UA.   Larene Pickett, PA-C 12/15/20 9150    Ripley Fraise, MD 12/15/20 [phone]

## 2020-12-15 NOTE — Discharge Instructions (Addendum)
You can use the Butt cream provided for relief of skin irritation. You are encouraged to clean with soap and water after defecation to avoid further irritation. Discussed your concerns with the in-home care being provided.   I spoke with your hospitalist at home team who indicates that they will see you tomorrow and follow-up your elevated lipase.  You also have scheduled urology follow-up tomorrow as well.  Please do not miss these appointments.  At this time you are safe for discharge but please return the emergency department if you have vomiting, recurrent abdominal pain, sweating, fevers or any other concerning symptoms.

## 2021-01-04 DIAGNOSIS — M329 Systemic lupus erythematosus, unspecified: Secondary | ICD-10-CM | POA: Insufficient documentation

## 2021-01-04 DIAGNOSIS — D591 Autoimmune hemolytic anemia, unspecified: Secondary | ICD-10-CM | POA: Insufficient documentation

## 2021-01-09 DIAGNOSIS — D5912 Cold autoimmune hemolytic anemia: Secondary | ICD-10-CM | POA: Insufficient documentation

## 2021-01-25 ENCOUNTER — Telehealth: Payer: Self-pay | Admitting: Oncology

## 2021-01-25 NOTE — Telephone Encounter (Signed)
Scheduled appt per 1/12 referral. Spoke to pt who is aware of appt date and time. Pt requested I mail him a letter as well. Mailed letter with appt information today.

## 2021-02-08 NOTE — Progress Notes (Incomplete)
[facility]  [address] [place],  [place]  [id number] [phone]  Clinic Day:  02/08/2021  Referring physician: No ref. provider found  This document serves as a record of services personally performed by Hosie Poisson, MD. It was created on their behalf by Curry,Lauren E, a trained medical scribe. The creation of this record is based on the scribe's personal observations and the provider's statements to them.  ASSESSMENT & PLAN:   Hemolytic anemia.   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 This has been longstanding for many years.  Thank you for the opportunity to participate in the care of your patients  I provided *** minutes of face-to-face time during this this encounter and > 50% was spent counseling as documented under my assessment and plan.    Derwood Kaplan, MD [facility] [address] [place] [address] Dept: [phone] Dept Fax: [phone]    CHIEF COMPLAINT:  CC: Hemolytic anemia  Current Treatment:  ***   HISTORY OF PRESENT ILLNESS:  Nathan Castaneda is a 46 y.o. 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 This has been longstanding for many years with his hemoglobin usually running from 10.0-8.0. However, back in  December 2022, his hemoglobin dropped down to as low as 6.1, and haptoglobin was down to 10. Reticulocyte count was 2.4. Repeat lab evaluation from January 3rd revealed a hemoglobin of 8.3, and haptoglobin was up to 70 and reticulocyte count was 4.5.  INTERVAL HISTORY:  I have reviewed his chart and materials related to his cancer extensively and collaborated history with the patient. Summary of oncologic history is as follows: Oncology History   No history exists.    [place] ***.   His  appetite is good, and is eating  well. He denies any significant unintentional weight loss or gain.  He denies fever, chills or other signs of infection.  He denies nausea, vomiting, bowel issues, or abdominal pain.  He denies sore throat, cough, dyspnea, or chest pain.  HISTORY:   Past Medical History:  Diagnosis Date   Anemia    History of recent blood transfusion    Hypertension    Lupus ([place])    Renal disease     Past Surgical History:  Procedure Laterality Date   AV FISTULA PLACEMENT     PERITONEAL CATHETER INSERTION     REVISON OF ARTERIOVENOUS FISTULA Right 09/12/2019   Procedure: 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 artery with reversed saphenous vein from left ankle;  Surgeon: Rosetta Posner, MD;  Location: [facility] OR;  Service: Vascular;  Laterality: Right;   VEIN HARVEST Left 09/12/2019   Procedure: SAPHENOUS VEIN HARVEST;  Surgeon: Rosetta Posner, MD;  Location: [facility] OR;  Service: Vascular;  Laterality: Left;   WOUND DEBRIDEMENT Right 09/26/2019   Procedure: RIGHT UPPER EXTREMITY WOUND WASHOUT;  Surgeon: Rosetta Posner, MD;  Location: [place];  Service: Vascular;  Laterality: Right;    No family history on file.  Social History:  reports that he has been smoking cigarettes. He has never used smokeless tobacco. He reports that he does not drink alcohol and does not use drugs.The patient is {Blank single:19197::"alone","accompanied by"} *** today. He is married/divorced and lives at home ***. He has *** children. He works as a ***, and has never been exposed  to chemicals or other toxic agents.  Allergies:  Allergies  Allergen Reactions   Vancomycin Itching    Current Medications: Current Outpatient Medications  Medication Sig Dispense Refill   acetaminophen (TYLENOL) 325 MG tablet Take 2 tablets (650 mg total) by mouth every 6 (six) hours as needed for mild pain (or Fever >/= 101).     amLODipine (NORVASC) 5 MG tablet Take 5 mg by mouth daily.     calcitRIOL (ROCALTROL) 0.25 MCG capsule  Take 0.25 mcg by mouth daily.     carvedilol (COREG) 12.5 MG tablet Take 12.5 mg by mouth in the morning and at bedtime.     cephALEXin (KEFLEX) 500 MG capsule Take 1 capsule (500 mg total) by mouth daily. 7 capsule 0   cloNIDine (CATAPRES) 0.1 MG tablet Take 0.1 mg by mouth in the morning and at bedtime.     ferric citrate (AURYXIA) 1 GM 210 MG(Fe) tablet Take 210 mg by mouth in the morning, at noon, and at bedtime.     furosemide (LASIX) 80 MG tablet Take 80 mg by mouth daily.     oxyCODONE-acetaminophen (PERCOCET/ROXICET) 5-325 MG tablet Take 1 tablet by mouth every 6 (six) hours as needed for severe pain. 20 tablet 0   pantoprazole (PROTONIX) 40 MG tablet Take 40 mg by mouth daily.     No current facility-administered medications for this visit.    REVIEW OF SYSTEMS:  Review of Systems - Oncology    VITALS:  There were no vitals taken for this visit.  Wt Readings from Last 3 Encounters:  12/15/20 167 lb 8.8 oz (76 kg)  10/20/19 165 lb 14.4 oz (75.3 kg)  10/06/19 172 lb 9.6 oz (78.3 kg)    There is no height or weight on file to calculate BMI.  Performance status (ECOG): {CHL ONC [id number]  PHYSICAL EXAM:  Physical Exam   LABS:   CBC Latest Ref Rng & Units 12/15/2020 09/26/2019 09/13/2019  WBC 4.0 - 10.5 K/uL 9.2 - 8.0  Hemoglobin 13.0 - 17.0 g/dL 7.6(L) 9.5(L) 9.0(L)  Hematocrit 39.0 - 52.0 % 23.0(L) 28.0(L) 28.1(L)  Platelets 150 - 400 K/uL 192 - 128(L)   CMP Latest Ref Rng & Units 12/15/2020 09/26/2019 09/13/2019  Glucose 70 - 99 mg/dL 103(H) 84 124(H)  BUN 6 - 20 mg/dL 67(H) 58(H) 66(H)  Creatinine 0.61 - 1.24 mg/dL 18.72(H) 17.20(H) 18.53(H)  Sodium 135 - 145 mmol/L 132(L) 137 131(L)  Potassium 3.5 - 5.1 mmol/L 3.6 4.1 4.7  Chloride 98 - 111 mmol/L 94(L) 99 97(L)  CO2 22 - 32 mmol/L 25 - 22  Calcium 8.9 - 10.3 mg/dL 8.7(L) - 7.5(L)  Total Protein 6.5 - 8.1 g/dL 6.8 - -  Total Bilirubin 0.3 - 1.2 mg/dL 0.5 - -  Alkaline Phos 38 - 126 U/L 96 - -  AST 15 - 41 U/L  18 - -  ALT 0 - 44 U/L <5 - -     No results found for: CEA1 / No results found for: CEA1 No results found for: PSA1 No results found for: MCN470 No results found for: CAN125  No results found for: TOTALPROTELP, ALBUMINELP, A1GS, A2GS, BETS, BETA2SER, GAMS, MSPIKE, SPEI Lab Results  Component Value Date   TIBC 133 (L) 09/01/2019   FERRITIN 1,431 (H) 09/01/2019   IRONPCTSAT 16 (L) 09/01/2019   No results found for: LDH  STUDIES:  No results found.    I, Rita Ohara, am acting as scribe for Derwood Kaplan, MD  I have reviewed this report as typed by the medical scribe, and it is complete and accurate.

## 2021-02-09 ENCOUNTER — Telehealth: Payer: Self-pay | Admitting: Oncology

## 2021-02-09 ENCOUNTER — Encounter: Payer: Medicare Other | Admitting: Oncology

## 2021-02-09 ENCOUNTER — Other Ambulatory Visit: Payer: Medicare Other

## 2021-02-09 ENCOUNTER — Other Ambulatory Visit: Payer: Self-pay | Admitting: Oncology

## 2021-02-09 DIAGNOSIS — M329 Systemic lupus erythematosus, unspecified: Secondary | ICD-10-CM

## 2021-02-09 NOTE — Telephone Encounter (Signed)
Contacted pt since today's lab appt at 2:30 was a NS. Pt answered the phone and told me his vehicle was repossessed yesterday and has no way to get here for his appt. He requested I cancel these appts today and decided he will call us to R/S.

## 2021-03-31 ENCOUNTER — Other Ambulatory Visit: Payer: Self-pay

## 2021-03-31 ENCOUNTER — Inpatient Hospital Stay (HOSPITAL_COMMUNITY): Payer: Medicare Other

## 2021-03-31 ENCOUNTER — Inpatient Hospital Stay (HOSPITAL_COMMUNITY)
Admission: AD | Admit: 2021-03-31 | Discharge: 2021-04-10 | DRG: 388 | Disposition: A | Discharge status: Home / Self Care | Payer: Medicare Other | Source: Other Acute Inpatient Hospital | Attending: Internal Medicine | Admitting: Internal Medicine

## 2021-03-31 ENCOUNTER — Encounter (HOSPITAL_COMMUNITY): Payer: Self-pay | Admitting: Internal Medicine

## 2021-03-31 DIAGNOSIS — M329 Systemic lupus erythematosus, unspecified: Secondary | ICD-10-CM | POA: Diagnosis present

## 2021-03-31 DIAGNOSIS — K449 Diaphragmatic hernia without obstruction or gangrene: Secondary | ICD-10-CM | POA: Diagnosis present

## 2021-03-31 DIAGNOSIS — F17210 Nicotine dependence, cigarettes, uncomplicated: Secondary | ICD-10-CM | POA: Diagnosis present

## 2021-03-31 DIAGNOSIS — Z20822 Contact with and (suspected) exposure to covid-19: Secondary | ICD-10-CM | POA: Diagnosis present

## 2021-03-31 DIAGNOSIS — I120 Hypertensive chronic kidney disease with stage 5 chronic kidney disease or end stage renal disease: Secondary | ICD-10-CM | POA: Diagnosis present

## 2021-03-31 DIAGNOSIS — K2100 Gastro-esophageal reflux disease with esophagitis, without bleeding: Secondary | ICD-10-CM | POA: Diagnosis present

## 2021-03-31 DIAGNOSIS — Z992 Dependence on renal dialysis: Secondary | ICD-10-CM

## 2021-03-31 DIAGNOSIS — E876 Hypokalemia: Secondary | ICD-10-CM | POA: Diagnosis not present

## 2021-03-31 DIAGNOSIS — R231 Pallor: Secondary | ICD-10-CM | POA: Diagnosis present

## 2021-03-31 DIAGNOSIS — D631 Anemia in chronic kidney disease: Secondary | ICD-10-CM | POA: Diagnosis present

## 2021-03-31 DIAGNOSIS — K56609 Unspecified intestinal obstruction, unspecified as to partial versus complete obstruction: Secondary | ICD-10-CM | POA: Diagnosis present

## 2021-03-31 DIAGNOSIS — I10 Essential (primary) hypertension: Secondary | ICD-10-CM | POA: Diagnosis not present

## 2021-03-31 DIAGNOSIS — Z881 Allergy status to other antibiotic agents status: Secondary | ICD-10-CM

## 2021-03-31 DIAGNOSIS — K5651 Intestinal adhesions [bands], with partial obstruction: Secondary | ICD-10-CM | POA: Diagnosis present

## 2021-03-31 DIAGNOSIS — F419 Anxiety disorder, unspecified: Secondary | ICD-10-CM | POA: Diagnosis present

## 2021-03-31 DIAGNOSIS — R066 Hiccough: Secondary | ICD-10-CM | POA: Diagnosis not present

## 2021-03-31 DIAGNOSIS — R748 Abnormal levels of other serum enzymes: Secondary | ICD-10-CM

## 2021-03-31 DIAGNOSIS — K859 Acute pancreatitis without necrosis or infection, unspecified: Secondary | ICD-10-CM | POA: Diagnosis present

## 2021-03-31 DIAGNOSIS — N2581 Secondary hyperparathyroidism of renal origin: Secondary | ICD-10-CM | POA: Diagnosis present

## 2021-03-31 DIAGNOSIS — E162 Hypoglycemia, unspecified: Secondary | ICD-10-CM | POA: Diagnosis not present

## 2021-03-31 DIAGNOSIS — Z79899 Other long term (current) drug therapy: Secondary | ICD-10-CM | POA: Diagnosis not present

## 2021-03-31 DIAGNOSIS — K219 Gastro-esophageal reflux disease without esophagitis: Secondary | ICD-10-CM | POA: Diagnosis not present

## 2021-03-31 DIAGNOSIS — N186 End stage renal disease: Secondary | ICD-10-CM | POA: Diagnosis present

## 2021-03-31 HISTORY — DX: Anxiety disorder, unspecified: F41.9

## 2021-03-31 LAB — BASIC METABOLIC PANEL
Anion gap: 11 (ref 5–15)
BUN: 62 mg/dL — ABNORMAL HIGH (ref 6–20)
CO2: 26 mmol/L (ref 22–32)
Calcium: 9.3 mg/dL (ref 8.9–10.3)
Chloride: 100 mmol/L (ref 98–111)
Creatinine, Ser: 13.06 mg/dL — ABNORMAL HIGH (ref 0.61–1.24)
GFR, Estimated: 4 mL/min — ABNORMAL LOW (ref >60)
Glucose, Bld: 84 mg/dL (ref 70–99)
Potassium: 5.1 mmol/L (ref 3.5–5.1)
Sodium: 137 mmol/L (ref 135–145)

## 2021-03-31 LAB — CBC
HCT: 25.6 % — ABNORMAL LOW (ref 39.0–52.0)
Hemoglobin: 8.6 g/dL — ABNORMAL LOW (ref 13.0–17.0)
MCH: 29.5 pg (ref 26.0–34.0)
MCHC: 33.6 g/dL (ref 30.0–36.0)
MCV: 87.7 fL (ref 80.0–100.0)
Platelets: 151 10*3/uL (ref 150–400)
RBC: 2.92 MIL/uL — ABNORMAL LOW (ref 4.22–5.81)
RDW: 18.6 % — ABNORMAL HIGH (ref 11.5–15.5)
WBC: 6.4 10*3/uL (ref 4.0–10.5)
nRBC: 0 % (ref 0.0–0.2)

## 2021-03-31 LAB — RESP PANEL BY RT-PCR (FLU A&B, COVID) ARPGX2
Influenza A by PCR: NEGATIVE
Influenza B by PCR: NEGATIVE
SARS Coronavirus 2 by RT PCR: NEGATIVE

## 2021-03-31 LAB — HIV ANTIBODY (ROUTINE TESTING W REFLEX): HIV Screen 4th Generation wRfx: NONREACTIVE

## 2021-03-31 LAB — LIPASE, BLOOD: Lipase: 678 U/L — ABNORMAL HIGH (ref 11–51)

## 2021-03-31 LAB — PHOSPHORUS: Phosphorus: 8 mg/dL — ABNORMAL HIGH (ref 2.5–4.6)

## 2021-03-31 LAB — MRSA NEXT GEN BY PCR, NASAL: MRSA by PCR Next Gen: NOT DETECTED

## 2021-03-31 MED ORDER — CARVEDILOL 12.5 MG PO TABS
12.5000 mg | ORAL_TABLET | Freq: Two times a day (BID) | ORAL | Status: DC
Start: 2021-03-31 — End: 2021-03-31

## 2021-03-31 MED ORDER — FUROSEMIDE 40 MG PO TABS
80.0000 mg | ORAL_TABLET | Freq: Every day | ORAL | Status: DC
  Administered 2021-03-31: 80 mg via ORAL
  Filled 2021-03-31: qty 2

## 2021-03-31 MED ORDER — MAGNESIUM HYDROXIDE 400 MG/5ML PO SUSP: 30.0000 mL | Freq: Every day | ORAL | Status: DC | PRN

## 2021-03-31 MED ORDER — PANTOPRAZOLE SODIUM 40 MG PO TBEC
40.0000 mg | DELAYED_RELEASE_TABLET | Freq: Every day | ORAL | Status: DC
  Administered 2021-03-31 – 2021-04-08 (×8): 40 mg via ORAL
  Filled 2021-03-31 (×8): qty 1

## 2021-03-31 MED ORDER — ACETAMINOPHEN 325 MG PO TABS
650.0000 mg | ORAL_TABLET | Freq: Four times a day (QID) | ORAL | Status: DC | PRN
Start: 2021-03-31 — End: 2021-04-10
  Administered 2021-04-05 – 2021-04-09 (×3): 650 mg via ORAL
  Filled 2021-03-31 (×3): qty 2

## 2021-03-31 MED ORDER — TRAZODONE HCL 50 MG PO TABS
25.0000 mg | ORAL_TABLET | Freq: Every evening | ORAL | Status: DC | PRN
  Administered 2021-04-02 – 2021-04-09 (×5): 25 mg via ORAL
  Filled 2021-03-31 (×5): qty 1

## 2021-03-31 MED ORDER — SODIUM CHLORIDE 0.9 % IV SOLN: INTRAVENOUS | Status: DC

## 2021-03-31 MED ORDER — ONDANSETRON HCL 4 MG PO TABS
4.0000 mg | ORAL_TABLET | Freq: Four times a day (QID) | ORAL | Status: DC | PRN
  Administered 2021-04-04: 4 mg via ORAL
  Filled 2021-03-31: qty 1

## 2021-03-31 MED ORDER — FERRIC CITRATE 1 GM 210 MG(FE) PO TABS
210.0000 mg | ORAL_TABLET | Freq: Three times a day (TID) | ORAL | Status: DC
  Filled 2021-03-31: qty 1

## 2021-03-31 MED ORDER — DOXERCALCIFEROL 4 MCG/2ML IV SOLN
1.0000 ug | INTRAVENOUS | Status: DC
  Administered 2021-04-02 – 2021-04-05 (×2): 1 ug via INTRAVENOUS
  Filled 2021-03-31 (×4): qty 2

## 2021-03-31 MED ORDER — OXYCODONE-ACETAMINOPHEN 5-325 MG PO TABS
1.0000 | ORAL_TABLET | Freq: Four times a day (QID) | ORAL | Status: DC | PRN
  Administered 2021-03-31 – 2021-04-04 (×4): 1 via ORAL
  Filled 2021-03-31 (×4): qty 1

## 2021-03-31 MED ORDER — HEPARIN SODIUM (PORCINE) 1000 UNIT/ML DIALYSIS: 1500.0000 [IU] | INTRAMUSCULAR | Status: DC | PRN

## 2021-03-31 MED ORDER — HEPARIN SODIUM (PORCINE) 5000 UNIT/ML IJ SOLN
5000.0000 [IU] | Freq: Three times a day (TID) | INTRAMUSCULAR | Status: DC
  Administered 2021-03-31 – 2021-04-08 (×19): 5000 [IU] via SUBCUTANEOUS
  Filled 2021-03-31 (×22): qty 1

## 2021-03-31 MED ORDER — DARBEPOETIN ALFA 200 MCG/0.4ML IJ SOSY
200.0000 ug | PREFILLED_SYRINGE | INTRAMUSCULAR | Status: DC
  Administered 2021-03-31 – 2021-04-07 (×2): 200 ug via SUBCUTANEOUS
  Filled 2021-03-31 (×4): qty 0.4

## 2021-03-31 MED ORDER — CALCITRIOL 0.25 MCG PO CAPS
0.2500 ug | ORAL_CAPSULE | Freq: Every day | ORAL | Status: DC
  Administered 2021-03-31: 0.25 ug via ORAL
  Filled 2021-03-31: qty 1

## 2021-03-31 MED ORDER — ACETAMINOPHEN 650 MG RE SUPP: 650.0000 mg | Freq: Four times a day (QID) | RECTAL | Status: DC | PRN

## 2021-03-31 MED ORDER — DIATRIZOATE MEGLUMINE & SODIUM 66-10 % PO SOLN
90.0000 mL | Freq: Once | ORAL | Status: AC
  Administered 2021-03-31: 90 mL via NASOGASTRIC
  Filled 2021-03-31: qty 90

## 2021-03-31 MED ORDER — ONDANSETRON HCL 4 MG/2ML IJ SOLN
4.0000 mg | Freq: Four times a day (QID) | INTRAMUSCULAR | Status: DC | PRN
  Administered 2021-04-04 – 2021-04-08 (×4): 4 mg via INTRAVENOUS
  Filled 2021-03-31 (×5): qty 2

## 2021-03-31 MED ORDER — AMLODIPINE BESYLATE 5 MG PO TABS
5.0000 mg | ORAL_TABLET | Freq: Every day | ORAL | Status: DC
Start: 2021-03-31 — End: 2021-03-31

## 2021-03-31 MED ORDER — CLONIDINE HCL 0.1 MG PO TABS
0.1000 mg | ORAL_TABLET | Freq: Every day | ORAL | Status: DC
Start: 2021-03-31 — End: 2021-03-31

## 2021-03-31 MED ORDER — CHLORHEXIDINE GLUCONATE CLOTH 2 % EX PADS
6.0000 | MEDICATED_PAD | Freq: Every day | CUTANEOUS | Status: DC
  Administered 2021-04-01 – 2021-04-04 (×2): 6 via TOPICAL

## 2021-03-31 NOTE — Progress Notes (Signed)
?  Transition of Care (TOC) Screening Note ? ? ?Patient Details  ?Name: Nathan Castaneda ?Date of Birth: 12-Oct-1975 ? ? ?Transition of Care (TOC) CM/SW Contact:    ?Benard Halsted, LCSW ?Phone Number: ?03/31/2021, 8:56 AM ? ? ? ?Transition of Care Department [facility]) has reviewed patient and no TOC needs have been identified at this time. We will continue to monitor patient advancement through interdisciplinary progression rounds. If new patient transition needs arise, please place a TOC consult. ? ? ?

## 2021-03-31 NOTE — Consult Note (Signed)
?[facility] ?Consult Note ? ?Nathan Castaneda ?Aug 24, 1975  ?[id number].   ? ?Requesting MD: Dana Allan ?Chief Complaint/Reason for Consult: SBO ? ?HPI:  ?Nathan Castaneda is a 46yo male PMH lupus and ESRD on HD, who was transferred from [facility] to [facility] for admission to the hospitalist service with SBO. He reports a two day history of lower abdominal pain and bloating. Pain gradually became worse and he started vomiting yesterday. Last BM was yesterday and normal. No prior h/o SBO. Due to persistent pain he went to the ED. In the ED he underwent CT scan which reports multiple loops of small bowel in the central abdomen concerning for small bowel obstruction. I am unable to see these images. NG tube was placed. Patient states that his pain is slightly improved since admission. He has not passed any flatus today. ? ?Anticoagulants: none ?Smokes a few cigarettes daily ?Denies alcohol or illicit drug use ? ?Review of Systems  ?Constitutional: Negative.   ?Respiratory: Negative.    ?Cardiovascular: Negative.   ?Gastrointestinal:  Positive for abdominal pain, nausea and vomiting.  ? ?All systems reviewed and otherwise negative except for as above ? ?History reviewed. No pertinent family history. ? ?Past Medical History:  ?Diagnosis Date  ? Anemia   ? Anxiety   ? History of recent blood transfusion   ? Hypertension   ? Lupus ([place])   ? Renal disease   ? ? ?Past Surgical History:  ?Procedure Laterality Date  ? AV FISTULA PLACEMENT    ? PERITONEAL CATHETER INSERTION    ? REVISON OF ARTERIOVENOUS FISTULA Right 09/12/2019  ? Procedure: 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 artery with reversed saphenous vein from left ankle;  Surgeon: Rosetta Posner, MD;  Location: [facility] OR;  Service: Vascular;  Laterality: Right;  ? VEIN HARVEST Left 09/12/2019  ? Procedure: SAPHENOUS VEIN HARVEST;  Surgeon: Rosetta Posner, MD;  Location: [place];  Service: Vascular;  Laterality: Left;  ? WOUND  DEBRIDEMENT Right 09/26/2019  ? Procedure: RIGHT UPPER EXTREMITY WOUND WASHOUT;  Surgeon: Rosetta Posner, MD;  Location: [place];  Service: Vascular;  Laterality: Right;  ? ? ?Social History:  reports that he has been smoking cigarettes. He has a 7.50 pack-year smoking history. He has never used smokeless tobacco. He reports that he does not drink alcohol and does not use drugs. ? ?Allergies:  ?Allergies  ?Allergen Reactions  ? Vancomycin Itching  ? ? ?Medications Prior to Admission  ?Medication Sig Dispense Refill  ? acetaminophen (TYLENOL) 325 MG tablet Take 2 tablets (650 mg total) by mouth every 6 (six) hours as needed for mild pain (or Fever >/= 101).    ? amLODipine (NORVASC) 5 MG tablet Take 5 mg by mouth daily.    ? calcitRIOL (ROCALTROL) 0.25 MCG capsule Take 0.25 mcg by mouth daily.    ? carvedilol (COREG) 12.5 MG tablet Take 12.5 mg by mouth in the morning and at bedtime.    ? cephALEXin (KEFLEX) 500 MG capsule Take 1 capsule (500 mg total) by mouth daily. 7 capsule 0  ? cloNIDine (CATAPRES) 0.1 MG tablet Take 0.1 mg by mouth in the morning and at bedtime.    ? ferric citrate (AURYXIA) 1 GM 210 MG(Fe) tablet Take 210 mg by mouth in the morning, at noon, and at bedtime.    ? furosemide (LASIX) 80 MG tablet Take 80 mg by mouth daily.    ? oxyCODONE-acetaminophen (PERCOCET/ROXICET) 5-325 MG tablet Take  1 tablet by mouth every 6 (six) hours as needed for severe pain. 20 tablet 0  ? pantoprazole (PROTONIX) 40 MG tablet Take 40 mg by mouth daily.    ? ? ?Prior to Admission medications   ?Medication Sig Start Date End Date Taking? Authorizing Provider  ?acetaminophen (TYLENOL) 325 MG tablet Take 2 tablets (650 mg total) by mouth every 6 (six) hours as needed for mild pain (or Fever >/= 101). 09/14/19   Domenic Polite, MD  ?amLODipine (NORVASC) 5 MG tablet Take 5 mg by mouth daily. 05/14/18   [provider]  ?calcitRIOL (ROCALTROL) 0.25 MCG capsule Take 0.25 mcg by mouth daily. 08/20/19   [provider]  ?carvedilol (COREG) 12.5 MG tablet Take 12.5 mg by mouth in the morning and at bedtime. 06/19/18   [provider]  ?cephALEXin (KEFLEX) 500 MG capsule Take 1 capsule (500 mg total) by mouth daily. 10/06/19   Rhyne, Hulen Shouts, PA-C  ?cloNIDine (CATAPRES) 0.1 MG tablet Take 0.1 mg by mouth in the morning and at bedtime. 10/11/17   [provider]  ?ferric citrate (AURYXIA) 1 GM 210 MG(Fe) tablet Take 210 mg by mouth in the morning, at noon, and at bedtime.    [provider]  ?furosemide (LASIX) 80 MG tablet Take 80 mg by mouth daily. 02/04/18   [provider]  ?oxyCODONE-acetaminophen (PERCOCET/ROXICET) 5-325 MG tablet Take 1 tablet by mouth every 6 (six) hours as needed for severe pain. 09/26/19   Rhyne, Hulen Shouts, PA-C  ?pantoprazole (PROTONIX) 40 MG tablet Take 40 mg by mouth daily. 04/17/18   [provider]  ? ? ?Blood pressure 122/77, pulse 73, temperature 97.9 ?F (36.6 ?C), temperature source Oral, resp. rate 16, height 5' 10"  (1.778 m), weight 66.8 kg, SpO2 97 %. ?Physical Exam: ?General: pleasant, WD/WN male who is laying in bed in NAD ?HEENT: head is normocephalic, atraumatic.  Sclera are noninjected.  Pupils equal and round.  Ears and nose without any masses or lesions.  Mouth is pink and moist. Dentition fair ?Heart: regular, rate, and rhythm.  Normal s1,s2. No obvious murmurs, gallops, or rubs noted.  Palpable pedal pulses bilaterally  ?Lungs: CTAB, no wheezes, rhonchi, or rales noted.  Respiratory effort nonlabored ?Abd: soft, mild distension, few BS heard, mild lower abdominal TTP without rebound or guarding. No masses, hernias, or organomegaly ?MS: no BUE/BLE edema, calves soft and nontender ?Skin: warm and dry with no masses, lesions, or rashes ?Psych: A&Ox4 with an appropriate affect ?Neuro: cranial nerves grossly intact, equal strength in BUE/BLE bilaterally, normal speech, thought process intact ? ?Results for orders placed or performed during the  hospital encounter of 03/31/21 (from the past 48 hour(s))  ?Basic metabolic panel     Status: Abnormal  ? Collection Time: 03/31/21  3:48 AM  ?Result Value Ref Range  ? Sodium 137 135 - 145 mmol/L  ? Potassium 5.1 3.5 - 5.1 mmol/L  ? Chloride 100 98 - 111 mmol/L  ? CO2 26 22 - 32 mmol/L  ? Glucose, Bld 84 70 - 99 mg/dL  ?  Comment: Glucose reference range applies only to samples taken after fasting for at least 8 hours.  ? BUN 62 (H) 6 - 20 mg/dL  ? Creatinine, Ser 13.06 (H) 0.61 - 1.24 mg/dL  ? Calcium 9.3 8.9 - 10.3 mg/dL  ? GFR, Estimated 4 (L) >60 mL/min  ?  Comment: (NOTE) ?Calculated using the CKD-EPI Creatinine Equation (2021) ?  ? Anion gap 11 5 -  15  ?  Comment: Performed at [facility], [place] [address], [place], [place] [address]  ?CBC     Status: Abnormal  ? Collection Time: 03/31/21  3:48 AM  ?Result Value Ref Range  ? WBC 6.4 4.0 - 10.5 K/uL  ? RBC 2.92 (L) 4.22 - 5.81 MIL/uL  ? Hemoglobin 8.6 (L) 13.0 - 17.0 g/dL  ? HCT 25.6 (L) 39.0 - 52.0 %  ? MCV 87.7 80.0 - 100.0 fL  ? MCH 29.5 26.0 - 34.0 pg  ? MCHC 33.6 30.0 - 36.0 g/dL  ? RDW 18.6 (H) 11.5 - 15.5 %  ? Platelets 151 150 - 400 K/uL  ? nRBC 0.0 0.0 - 0.2 %  ?  Comment: Performed at [facility], [place] [address], [place], [place] [address]  ?MRSA Next Gen by PCR, Nasal     Status: None  ? Collection Time: 03/31/21  4:08 AM  ? Specimen: Nasal Mucosa; Nasal Swab  ?Result Value Ref Range  ? MRSA by PCR Next Gen NOT DETECTED NOT DETECTED  ?  Comment: (NOTE) ?The GeneXpert MRSA Assay (FDA approved for NASAL specimens only), ?is one component of a comprehensive MRSA colonization surveillance ?program. It is not intended to diagnose MRSA infection nor to guide ?or monitor treatment for MRSA infections. ?Test performance is not FDA approved in patients less than 2 years ?old. ?Performed at [facility], [place] [address], [place], [place] ?[address] ?  ? ?No results found. ? ? ? ?Assessment/Plan ?SBO ?- PSH: PD catheter placement and  removal ?- unable to see CT scan images but report states prominent multiple loops of small bowel in the central abdomen concerning for small bowel obstruction; there was small amount of ascites ?- NG tube placed. Will

## 2021-03-31 NOTE — Progress Notes (Signed)
MRSA swab sent to lab.

## 2021-03-31 NOTE — Assessment & Plan Note (Signed)
-   We will continue his antihypertensives. 

## 2021-03-31 NOTE — Progress Notes (Signed)
Brief note: ?Patient is a 46 year old African-American male with past medical history significant for end-stage renal disease on hemodialysis TTS, lupus, pancreatitis and hypertension.  Patient presented with abdominal pain.  Work-up revealed small bowel obstruction.  Surgical team is managing.  Patient is currently n.p.o., with NG tube in place. ? ?No new complaints. ? ?O/E: Not in any distress.  Awake and alert. ?HEENT - Pallor, no jaundice ?Neck: Supple.  No raised JVD. ?Lungs: Clear to auscultation. ?CVS: S1-S2. ?Abdomen: Soft and nontender. ?Neuro: Awake and alert.  Patient moves all extremities. ?Extremities: No leg edema. ? ? ?Assessment and Plan: ?* SBO (small bowel obstruction) ([place]) ?- Surgical team is directing care. ?-NG tube in place. ?-Further care as per the surgical team. ?-Abdominal pain has resolved. ?-No nausea or vomiting.   ?  ?Elevated lipase ?- No recent lipase level visualized.   ? ?ESRD (end stage renal disease) ([place]) ?- On hemodialysis TTS. ?-We will consult nephrology.. ?  ?Hypertension ?- Continue to optimize. ?-Goal blood pressure should be less than 130/80 mmHg. ? ?GERD without esophagitis ?- We will continue PPI therapy. ?  ?  ?DVT prophylaxis: Heparin. ?Advanced Care Planning:  Code Status: full code. ? ?Disposition Plan: Back to previous home environment ?Consults called: General surgery and nephrology. ?

## 2021-03-31 NOTE — Progress Notes (Signed)
Dr. Sidney Ace is now pt's doctor. Doctor paged for admission orders at 0330 and then for order clarifications at [place] ?

## 2021-03-31 NOTE — Progress Notes (Signed)
Pt arrived per EMS in stable condition. Transferred to bed, VS done, CHG bath given, NGT connected to suction, and IVF resumed. ?

## 2021-03-31 NOTE — Progress Notes (Signed)
Stopped taking Coreg and Norvasc 2 weeks ago because it made his BP too low with HD. Doesn't take Protonix. Takes all other meds (Rocalcitrol and Lasix) per orders ?

## 2021-03-31 NOTE — Assessment & Plan Note (Signed)
-   We will continue PPI therapy 

## 2021-03-31 NOTE — Assessment & Plan Note (Addendum)
-   We will follow lipase level. ?- This could be related to acute pancreatitis and possibly SBO. ?- The patient will be kept n.p.o. except for medications. ?

## 2021-03-31 NOTE — Consult Note (Signed)
Renal Service ?Consult Note ?[facility] ? ?Nathan Castaneda ?03/31/2021 ?Nathan Blazing, Castaneda ?Requesting Physician: Nathan. Marthenia Castaneda ? ?Reason for Consult: ESRD pt w/ SBO ?HPI: The patient is a 46 y.o. year-old w/ hx of anemia, HTN, SLE, ESRD on HD presented early am today by EMS to ED for abdominal pain w/ assoc N/V for 1 day. No fevers, no bloody stool or hematemesis. In ED BP 120/75, HR 70 and RR 16. K 4.8 , creat 12, Hb 10.2, WBC 5K. Lipase 413. CT abd showed prominent loops of SB in the mid abd concerning for SBO.  NG tube was placed and pt rec'd IV morphine, zofran and NS. BP's stable. Asked to see for dialysis.  ? ?Pt seen in room. Pt is in pain.  ? ?Per chart the patient was on PD then switched to HD and had R femoral TDC placed in Dec 2022.  ? ?Last admits here was one in Aug 2022 for anemia, N/V, abd pain and epistaxis.  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  ? ? ?ROS - denies CP, no joint pain, no HA, no blurry vision, no rash, no diarrhea, no nausea/ vomiting, no dysuria, no difficulty voiding ? ? ?Past Medical History  ?Past Medical History:  ?Diagnosis Date  ? Anemia   ? Anxiety   ? History of recent blood transfusion   ? Hypertension   ? Lupus ([place])   ? Renal disease   ? ?Past Surgical History  ?Past Surgical History:  ?Procedure Laterality Date  ? AV FISTULA PLACEMENT    ? PERITONEAL CATHETER INSERTION    ? REVISON OF ARTERIOVENOUS FISTULA Right 09/12/2019  ? Procedure: 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 artery with reversed saphenous vein from left ankle;  Surgeon: Nathan Castaneda;  Location: [facility] OR;  Service: Vascular;  Laterality: Right;  ? VEIN HARVEST Left 09/12/2019  ? Procedure: SAPHENOUS VEIN HARVEST;  Surgeon: Nathan Castaneda;  Location: [place];  Service: Vascular;  Laterality: Left;  ? WOUND DEBRIDEMENT Right 09/26/2019  ? Procedure: RIGHT UPPER EXTREMITY WOUND WASHOUT;   Surgeon: Nathan Castaneda;  Location: [place];  Service: Vascular;  Laterality: Right;  ? ?Family History History reviewed. No pertinent family history. ?Social History  reports that he has been smoking cigarettes. He has a 7.50 pack-year smoking history. He has never used smokeless tobacco. He reports that he does not drink alcohol and does not use drugs. ?Allergies  ?Allergies  ?Allergen Reactions  ? Vancomycin Itching  ? ?Home medications ?Prior to Admission medications   ?Medication Sig Start Date End Date Taking? Authorizing Provider  ?b complex-vitamin c-folic acid (NEPHRO-VITE) 0.8 MG TABS tablet Take 1 tablet by mouth daily. 02/03/21  Yes Provider, Historical, Castaneda  ?calcitRIOL (ROCALTROL) 0.25 MCG capsule Take 0.25 mcg by mouth daily. 08/20/19  Yes Provider, Historical, Castaneda  ?ferric citrate (AURYXIA) 1 GM 210 MG(Fe) tablet Take 210 mg by mouth in the morning, at noon, and at bedtime.   Yes Provider, Historical, Castaneda  ?furosemide (LASIX) 80 MG tablet Take 80 mg by mouth daily. 02/04/18  Yes Provider, Historical, Castaneda  ?amLODipine (NORVASC) 5 MG tablet Take 5 mg by mouth daily. 05/14/18   Provider, Historical, Castaneda  ?carvedilol (COREG) 12.5 MG tablet Take 12.5 mg by mouth in the morning and at bedtime. 06/19/18   Provider, Historical, Castaneda  ?cloNIDine (CATAPRES) 0.1  MG tablet Take 0.1 mg by mouth in the morning and at bedtime. 10/11/17   Provider, Historical, Castaneda  ?oxyCODONE-acetaminophen (PERCOCET/ROXICET) 5-325 MG tablet Take 1 tablet by mouth every 6 (six) hours as needed for severe pain. 09/26/19   Rhyne, Hulen Shouts, PA-C  ?pantoprazole (PROTONIX) 40 MG tablet Take 40 mg by mouth daily. 04/17/18   Provider, Historical, Castaneda  ?sildenafil (VIAGRA) 100 MG tablet Take 100 mg by mouth daily as needed for erectile dysfunction. 02/03/21   Provider, Historical, Castaneda  ? ? ? ?Vitals:  ? 03/31/21 0250 03/31/21 0400 03/31/21 0801 03/31/21 1148  ?BP:  114/74 122/77 119/65  ?Pulse:  64 73 71  ?Resp:  11 16 17   ?Temp:   97.9 ?F (36.6 ?C) 97.7 ?F  (36.5 ?C)  ?TempSrc:   Oral Oral  ?SpO2:  96% 97% 99%  ?Weight: 66.8 kg     ?Height: 5' 10"  (1.778 m)     ? ?Exam ?Gen alert, no distress ?No rash, cyanosis or gangrene ?Sclera anicteric, throat clear  ?No jvd or bruits ?Chest clear bilat to bases, no rales/ wheezing ?RRR no MRG ?Abd soft ntnd no mass or ascites +bs ?GU normal ?MS no joint effusions or deformity ?Ext no LE or UE edema, no wounds or ulcers ?Neuro is alert, Ox 3 , nf ?   R fem TDC intact ? ? ? OP HD: TTS [place] ? 4h  400/600   67.5kg  2/2 bath  R fem TDC  Hep 2900 ? - hectorol 1 ug tiw ? - mircera 225 ug q2 last 3/09, due 3/23 today ? ? ?Assessment/ Plan: ?Small bowel obstruction - presented w/ N/V and abd pain, CT suggestive of SBO. NG placed. Per Surg/ pmd.  ?ESRD - on HD TTS.  Will plan HD later this evening.  ?HTN/ vol - under dry wt, will hold lasix po for now. Getting NS at 50/hr, continue. Keep even on HD.  ?MBD ckd - pt npo, hold binders till eating.  Cont IV vdra. CCa in range, will add phos ?Anemia ckd - due for esa today, have ordered darbe 200 mcg sq weekly while here. ? ? ?Kelly Splinter  Castaneda ?03/31/2021, 2:07 PM ?Recent Labs  ?Lab 03/31/21 ?7681  ?HGB 8.6*  ?CALCIUM 9.3  ?CREATININE 13.06*  ?K 5.1  ? ? ?

## 2021-03-31 NOTE — H&P (Addendum)
?  ?  ?[name] ? ? ?PATIENT NAME: Chief Executive Officer   ? ?MR#:  [id number] ? ?DATE OF BIRTH:  05/14/1975 ? ?DATE OF ADMISSION:  03/31/2021 ? ?PRIMARY CARE PHYSICIAN: Patient, No Pcp Per (Inactive)  ? ?Patient is coming from: Home ? ?REQUESTING/REFERRING PHYSICIAN: Westley Foots, PA-C [facility] ER) ? ?CHIEF COMPLAINT:  ?Abdominal pain ? ?HISTORY OF PRESENT ILLNESS:  ?Nathan Castaneda is a 46 y.o. African-American male with medical history significant for essential hypertension, lupus, end-stage renal disease on hemodialysis on TTS, previously on peritoneal dialysis, pancreatitis, who presented to the ER at [facility] with acute onset diffuse abdominal pain with associated nausea and vomiting for a day.  No fever or chills.  He denies any chest pain or dyspnea or cough or wheezing. No melena or blood red bleeding per rectum.  No bilious vomitus or hematemesis.  His last bowel movement was yesterday morning.  He denies any fever or chills.  He denied any alcohol intake.  No chest pain or palpitations.  No urinary frequency or urgency or dysuria or flank pain with a small amount of urine he is making.  There was no hemodialysis available at [facility].  The patient denies any previous abdominal surgeries.  He only had a peritoneal catheter placed for previous peritoneal dialysis. ? ?ED Course: Upon arrival here BP was 119/73 with heart rate of 69 respiratory rate of 16 pulse currently 96% on room air.  Labs from [place] ED records revealed: Sodium was 137, potassium 4.8, chloride 98, CO2 25, BUN 64, creatinine 12.1 and blood glucose 98 with calcium of 9.8.  LFTs were within normal.  Magnesium was 2.2.  CBC showed hemoglobin of 10.2 and hematocrit 32.8, WBC 5.9 and platelets 150.  Serum lipase was 413. ? ?Imaging: Abdominal and pelvic CT scan revealed prominent multiple loops of small bowel in the central abdomen concerning for small bowel obstruction.  There was small amount of ascites. ? ?The patient had an  NG tube placed with 200 mL of NG aspirate, hydration with NS as well as 4 mg of IV morphine sulfate and 4 mg of IV Zofran.  He is directly admitted to a PCU bed for further evaluation and management.   ?PAST MEDICAL HISTORY:  ? ?Past Medical History:  ?Diagnosis Date  ? Anemia   ? Anxiety   ? History of recent blood transfusion   ? Hypertension   ? Lupus ([place])   ? Renal disease   ? ? ?PAST SURGICAL HISTORY:  ? ?Past Surgical History:  ?Procedure Laterality Date  ? AV FISTULA PLACEMENT    ? PERITONEAL CATHETER INSERTION    ? REVISON OF ARTERIOVENOUS FISTULA Right 09/12/2019  ? Procedure: 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 artery with reversed saphenous vein from left ankle;  Surgeon: Rosetta Posner, MD;  Location: [facility] OR;  Service: Vascular;  Laterality: Right;  ? VEIN HARVEST Left 09/12/2019  ? Procedure: SAPHENOUS VEIN HARVEST;  Surgeon: Rosetta Posner, MD;  Location: [place];  Service: Vascular;  Laterality: Left;  ? WOUND DEBRIDEMENT Right 09/26/2019  ? Procedure: RIGHT UPPER EXTREMITY WOUND WASHOUT;  Surgeon: Rosetta Posner, MD;  Location: [place];  Service: Vascular;  Laterality: Right;  ? ? ?SOCIAL HISTORY:  ? ?Social History  ? ?Tobacco Use  ? Smoking status: Every Day  ?  Packs/day: 0.50  ?  Years: 15.00  ?  Pack years: 7.50  ?  Types: Cigarettes  ? Smokeless tobacco: Never  ?  Substance Use Topics  ? Alcohol use: Never  ? ? ?FAMILY HISTORY:  ? Positive for diabetes mellitus in his mother ? ?DRUG ALLERGIES:  ? ?Allergies  ?Allergen Reactions  ? Vancomycin Itching  ? ? ?REVIEW OF SYSTEMS:  ? ?ROS ?As per history of present illness. All pertinent systems were reviewed above. Constitutional, HEENT, cardiovascular, respiratory, GI, GU, musculoskeletal, neuro, psychiatric, endocrine, integumentary and hematologic systems were reviewed and are otherwise negative/unremarkable except for positive findings mentioned above in the HPI. ? ? ?MEDICATIONS AT HOME:  ? ?Prior to Admission medications    ?Medication Sig Start Date End Date Taking? Authorizing Provider  ?acetaminophen (TYLENOL) 325 MG tablet Take 2 tablets (650 mg total) by mouth every 6 (six) hours as needed for mild pain (or Fever >/= 101). 09/14/19   Domenic Polite, MD  ?amLODipine (NORVASC) 5 MG tablet Take 5 mg by mouth daily. 05/14/18   [provider]  ?calcitRIOL (ROCALTROL) 0.25 MCG capsule Take 0.25 mcg by mouth daily. 08/20/19   [provider]  ?carvedilol (COREG) 12.5 MG tablet Take 12.5 mg by mouth in the morning and at bedtime. 06/19/18   [provider]  ?cephALEXin (KEFLEX) 500 MG capsule Take 1 capsule (500 mg total) by mouth daily. 10/06/19   Rhyne, Hulen Shouts, PA-C  ?cloNIDine (CATAPRES) 0.1 MG tablet Take 0.1 mg by mouth in the morning and at bedtime. 10/11/17   [provider]  ?ferric citrate (AURYXIA) 1 GM 210 MG(Fe) tablet Take 210 mg by mouth in the morning, at noon, and at bedtime.    [provider]  ?furosemide (LASIX) 80 MG tablet Take 80 mg by mouth daily. 02/04/18   [provider]  ?oxyCODONE-acetaminophen (PERCOCET/ROXICET) 5-325 MG tablet Take 1 tablet by mouth every 6 (six) hours as needed for severe pain. 09/26/19   Rhyne, Hulen Shouts, PA-C  ?pantoprazole (PROTONIX) 40 MG tablet Take 40 mg by mouth daily. 04/17/18   [provider]  ? ?  ? ?VITAL SIGNS:  ?Blood pressure 114/74, pulse 64, resp. rate 11, height 5' 10"  (1.778 m), weight 66.8 kg, SpO2 96 %. ? ?PHYSICAL EXAMINATION:  ?Physical Exam ? ?GENERAL:  46 y.o.-year-old patient lying in the bed with no acute distress.  ?EYES: Pupils equal, round, reactive to light and accommodation. No scleral icterus. Extraocular muscles intact.  ?HEENT: Head atraumatic, normocephalic. Oropharynx and nasopharynx clear.  ?NECK:  Supple, no jugular venous distention. No thyroid enlargement, no tenderness.  ?LUNGS: Normal breath sounds bilaterally, no wheezing, rales,rhonchi or crepitation. No use of accessory muscles of  respiration.  ?CARDIOVASCULAR: Regular rate and rhythm, S1, S2 normal. No murmurs, rubs, or gallops.  ?ABDOMEN: Soft, nondistended, nontender. Bowel sounds present. No organomegaly or mass.  ?EXTREMITIES: No pedal edema, cyanosis, or clubbing.  ?NEUROLOGIC: Cranial nerves II through XII are intact. Muscle strength 5/5 in all extremities. Sensation intact. Gait not checked.  ?PSYCHIATRIC: The patient is alert and oriented x 3.  Normal affect and good eye contact. ?SKIN: No obvious rash, lesion, or ulcer.  ? ?LABORATORY PANEL:  ? ?CBC ?Recent Labs  ?Lab 03/31/21 ?2122  ?WBC 6.4  ?HGB 8.6*  ?HCT 25.6*  ?PLT 151  ? ?------------------------------------------------------------------------------------------------------------------ ? ?Chemistries  ?Recent Labs  ?Lab 03/31/21 ?4825  ?NA 137  ?K 5.1  ?CL 100  ?CO2 26  ?GLUCOSE 84  ?BUN 62*  ?CREATININE 13.06*  ?CALCIUM 9.3  ? ?------------------------------------------------------------------------------------------------------------------ ? ?Cardiac Enzymes ?No results for input(s): TROPONINI in the last 168 hours. ?------------------------------------------------------------------------------------------------------------------ ? ?RADIOLOGY:  ?No  results found. ? ? ? ?IMPRESSION AND PLAN:  ?Assessment and Plan: ?* SBO (small bowel obstruction) ([place]) ?- The patient was directly admitted to a PCU bed. ?- We will keep him n.p.o. ?- He had an NG tube placed. ?- Pain management will be provided. ?- A general surgery consult will be obtained. ?- I discussed the case with Dr. Kae Heller. ?- Serum lipase level will be followed ? ?Elevated lipase ?- We will follow lipase level. ?- This could be related to acute pancreatitis and possibly SBO. ?- The patient will be kept n.p.o. except for medications. ? ?ESRD (end stage renal disease) ([place]) ?- Nephrology consultation can be obtained in a.m. for follow-up on hemodialysis. ? ?Hypertension ?- We will continue his antihypertensives. ? ?GERD  without esophagitis ?- We will continue PPI therapy. ? ? ?DVT prophylaxis: Heparin. ?Advanced Care Planning:  Code Status: full code. ?Family Communication:  The plan of care was discussed in details with the p

## 2021-03-31 NOTE — Assessment & Plan Note (Addendum)
-   The patient was directly admitted to a PCU bed. ?- We will keep him n.p.o. ?- He had an NG tube placed. ?- Pain management will be provided. ?- A general surgery consult will be obtained. ?- I discussed the case with Dr. Kae Heller. ?- Serum lipase level will be followed ?

## 2021-03-31 NOTE — Assessment & Plan Note (Signed)
-   Nephrology consultation can be obtained in a.m. for follow-up on hemodialysis. ?

## 2021-04-01 DIAGNOSIS — K56609 Unspecified intestinal obstruction, unspecified as to partial versus complete obstruction: Secondary | ICD-10-CM | POA: Diagnosis not present

## 2021-04-01 LAB — GLUCOSE, CAPILLARY
Glucose-Capillary: 55 mg/dL — ABNORMAL LOW (ref 70–99)
Glucose-Capillary: 97 mg/dL (ref 70–99)

## 2021-04-01 LAB — MAGNESIUM: Magnesium: 2.1 mg/dL (ref 1.7–2.4)

## 2021-04-01 LAB — RENAL FUNCTION PANEL
Albumin: 2.8 g/dL — ABNORMAL LOW (ref 3.5–5.0)
Anion gap: 13 (ref 5–15)
BUN: 50 mg/dL — ABNORMAL HIGH (ref 6–20)
CO2: 24 mmol/L (ref 22–32)
Calcium: 9 mg/dL (ref 8.9–10.3)
Chloride: 103 mmol/L (ref 98–111)
Creatinine, Ser: 11.13 mg/dL — ABNORMAL HIGH (ref 0.61–1.24)
GFR, Estimated: 5 mL/min — ABNORMAL LOW (ref >60)
Glucose, Bld: 63 mg/dL — ABNORMAL LOW (ref 70–99)
Phosphorus: 6 mg/dL — ABNORMAL HIGH (ref 2.5–4.6)
Potassium: 4.3 mmol/L (ref 3.5–5.1)
Sodium: 140 mmol/L (ref 135–145)

## 2021-04-01 MED ORDER — DEXTROSE 50 % IV SOLN: 12.5000 g | INTRAVENOUS | Status: AC

## 2021-04-01 MED ORDER — CHLORHEXIDINE GLUCONATE 0.12 % MT SOLN: 15.0000 mL | Freq: Two times a day (BID) | OROMUCOSAL | Status: DC

## 2021-04-01 MED ORDER — DEXTROSE IN LACTATED RINGERS 5 % IV SOLN: INTRAVENOUS | Status: DC

## 2021-04-01 MED ORDER — CHLORHEXIDINE GLUCONATE 0.12 % MT SOLN
15.0000 mL | Freq: Two times a day (BID) | OROMUCOSAL | Status: DC
  Administered 2021-04-01 – 2021-04-10 (×14): 15 mL via OROMUCOSAL
  Filled 2021-04-01 (×18): qty 15

## 2021-04-01 MED ORDER — HEPARIN SODIUM (PORCINE) 1000 UNIT/ML IJ SOLN
INTRAMUSCULAR | Status: AC
  Filled 2021-04-01: qty 6

## 2021-04-01 MED ORDER — ORAL CARE MOUTH RINSE
15.0000 mL | Freq: Two times a day (BID) | OROMUCOSAL | Status: DC
  Administered 2021-04-01 – 2021-04-09 (×5): 15 mL via OROMUCOSAL

## 2021-04-01 MED ORDER — DEXTROSE-NACL 5-0.45 % IV SOLN
INTRAVENOUS | Status: AC
Start: 2021-04-01 — End: 2021-04-02

## 2021-04-01 MED ORDER — ORAL CARE MOUTH RINSE: 15.0000 mL | Freq: Two times a day (BID) | OROMUCOSAL | Status: DC

## 2021-04-01 MED ORDER — DEXTROSE 50 % IV SOLN
INTRAVENOUS | Status: AC
  Administered 2021-04-01: 12.5 g via INTRAVENOUS
  Filled 2021-04-01: qty 50

## 2021-04-01 NOTE — Progress Notes (Signed)
?[facility] ?Progress Note  ? ?Subjective:    ?Nathan Castaneda is a 46 y.o. year-old w/ hx of anemia, HTN, SLE, ESRD on HD (TTS) presented to the hospital yesterday (03/31/21) via EMS for abd pain with N/V; concern for SBO in the ED. NG tube was placed and patient received antiemetics. Patient was dialyzed yesterday evening (kept even).  ? ?Per chart review: The patient was on PD then switched to HD and had R femoral TDC placed in Dec 2022. Last admits here was one in Aug 2022 for anemia, N/V, abd pain and epistaxis.  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  ?  ?On exam today, he states his pain is well managed and he is ready to try a clear liquid diet. He tells me he has not had a bowel movement or any flatus yet. Denies CP, ShOB, HA, N/V, difficulty voiding, leg swelling.  ? ?Objective ?Vitals:  ? 04/01/21 0031 04/01/21 0050 04/01/21 0507 04/01/21 0737  ?BP: 115/79 109/77 109/70 107/71  ?Pulse:   73 74  ?Resp: 13 16 17 13   ?Temp: 98.2 ?F (36.8 ?C)  98.6 ?F (37 ?C) 98.2 ?F (36.8 ?C)  ?TempSrc: Oral  Oral Oral  ?SpO2: 100%  99% 100%  ?Weight: 67.5 kg     ?Height:      ? ?Physical Exam ?General: Alert, oriented in NAD  ?Heart: RRR no MRG ?Lungs: CTABL no rales, wheezing  ?Abdomen: soft, mild epigastric tenderness to palpate, ND ?Extremities: warm and well perfused. No edema or wounds ?Neuro/Psych: No gross deficits. Pleasant on exam today. ?Dialysis Access: R fem TDC intact ? ?Additional Objective ?Labs: ?Basic Metabolic Panel: ?Recent Labs  ?Lab 03/31/21 ?8338 04/01/21 ?0123  ?NA 137 140  ?K 5.1 4.3  ?CL 100 103  ?CO2 26 24  ?GLUCOSE 84 63*  ?BUN 62* 50*  ?CREATININE 13.06* 11.13*  ?CALCIUM 9.3 9.0  ?PHOS 8.0* 6.0*  ? ?Liver Function Tests: ?Recent Labs  ?Lab 04/01/21 ?0123  ?ALBUMIN 2.8*  ? ?Recent Labs  ?Lab 03/31/21 ?2505  ?LIPASE 678*  ? ?CBC: ?Recent Labs  ?Lab 03/31/21 ?3976  ?WBC 6.4  ?HGB 8.6*  ?HCT 25.6*  ?MCV 87.7   ?PLT 151  ? ?CBG: ?Recent Labs  ?Lab 04/01/21 ?0746 04/01/21 ?7341  ?GLUCAP 55* 97  ? ? ?Studies/Results: ?DG Abd Portable 1V-Small Bowel Obstruction Protocol-initial, 8 hr delay ? ?Result Date: 03/31/2021 ?CLINICAL DATA:  Follow up small bowel obstruction EXAM: PORTABLE ABDOMEN - 1 VIEW COMPARISON:  CT from the previous day. FINDINGS: Dialysis catheter is again noted from the right femoral approach. Contrast material is noted within the colon as well as within the small bowel. Dilated loops of small bowel are again identified consistent with small small-bowel obstruction. Given the colonic contrast, this is consistent with a partial small bowel obstruction. No free air is noted. Gastric catheter is noted within the stomach. IMPRESSION: Changes of partial small bowel obstruction although administered contrast now lies both within the small bowel and colon. Electronically Signed   By: Inez Catalina M.D.   On: 03/31/2021 20:41   ? ?Medications: ? dextrose 5 % and 0.45% NaCl 50 mL/hr at 04/01/21 9379  ? ? Chlorhexidine Gluconate Cloth  6 each Topical Q0600  ? darbepoetin (ARANESP) injection - NON-DIALYSIS  200 mcg Subcutaneous Q Thu-1800  ? [START ON 04/02/2021] doxercalciferol  1 mcg Intravenous Q T,Th,Sa-HD  ? heparin  5,000  Units Subcutaneous Q8H  ? heparin sodium (porcine)      ? pantoprazole  40 mg Oral Daily  ? ? ?Dialysis Orders: ?TTS [facility] ? 4h  400/600 67.5kg  2/2 bath  R fem TDC  Hep 2900 ? - hectorol 1 ug tiw ? ?Assessment/Plan: ?1. Small Bowel Obstruction: likely from adhesions from previous CAPD catheter per surg. Primary team will attempt to clamp and clear liquids today. If does well, will remove NG tube. If doesn't pass clamping trial, may proceed with exploratory surgery.  ?2. ESRD: HD TTS. Will plan for dialysis tomorrow.  ?3. HTN/volume: At dry wt, BP controlled. Continue to hold lasix. Receiving D5-half NS given NPO status. Reevaluate volume status tomorrow but may have to keep even on HD. ?4. Anemia  of Chronic Kidney disease: Received Aranesp yesterday. Next dose will be 3/31 if still here. Hgb of 8.6 yesterday. ?5. Secondary hyperparathyroidism: Patient progressing to clear liquids today, hold binders till eating. Continue IV vdra. CCa in range, Phos stable for now.  ?6. Nutrition: Clear liquid diet ? ? ?Arta Silence, PA-S2 ?04/01/2021, 10:46 AM  ? ?Seen in conjunction with the PA-S above. Agree with plan.  ?Veneta Penton, PA-C ?[facility] ?Pager [phone] ? ? ?

## 2021-04-01 NOTE — Progress Notes (Signed)
Progress Note: General Surgery Service  ? ?Chief Complaint/Subjective: ?Some epigastric tenderness on exam, not complaining of pain though.  No flatus or bowel movements.  NG output decreased ? ?Objective: ?Vital signs in last 24 hours: ?Temp:  [97.7 ?F (36.5 ?C)-98.8 ?F (37.1 ?C)] 98.2 ?F (36.8 ?C) (03/24 9381) ?Pulse Rate:  [71-76] 74 (03/24 0737) ?Resp:  [9-20] 13 (03/24 0737) ?BP: (101-128)/(65-83) 107/71 (03/24 0737) ?SpO2:  [98 %-100 %] 100 % (03/24 0737) ?Weight:  [67.5 kg-67.7 kg] 67.5 kg (03/24 0031) ?Last BM Date : 03/30/21 ? ?Intake/Output from previous day: ?03/23 0701 - 03/24 0700 ?In: 1843.3 [P.O.:40; I.V.:1773.3; NG/GT:30] ?Out: 700 [Emesis/NG output:700] ?Intake/Output this shift: ?No intake/output data recorded. ? ?Constitutional: NAD; conversant; no deformities ?Eyes: Moist conjunctiva; no lid lag; anicteric; PERRL ?Neck: Trachea midline; no thyromegaly ?Lungs: Normal respiratory effort; no tactile fremitus ?CV: RRR; no palpable thrills; no pitting edema ?GI: Abd Soft, tender in epigastric area; no palpable hepatosplenomegaly ?MSK: Normal range of motion of extremities; no clubbing/cyanosis ?Psychiatric: Appropriate affect; alert and oriented x3 ?Lymphatic: No palpable cervical or axillary lymphadenopathy ? ?Lab Results: ?CBC  ?Recent Labs  ?  03/31/21 ?0175  ?WBC 6.4  ?HGB 8.6*  ?HCT 25.6*  ?PLT 151  ? ?BMET ?Recent Labs  ?  03/31/21 ?1025 04/01/21 ?0123  ?NA 137 140  ?K 5.1 4.3  ?CL 100 103  ?CO2 26 24  ?GLUCOSE 84 63*  ?BUN 62* 50*  ?CREATININE 13.06* 11.13*  ?CALCIUM 9.3 9.0  ? ?PT/INR ?No results for input(s): LABPROT, INR in the last 72 hours. ?ABG ?No results for input(s): PHART, HCO3 in the last 72 hours. ? ?Invalid input(s): PCO2, PO2 ? ?Anti-infectives: ?Anti-infectives (From admission, onward)  ? ? None  ? ?  ? ? ?Medications: ?Scheduled Meds: ? Chlorhexidine Gluconate Cloth  6 each Topical Q0600  ? darbepoetin (ARANESP) injection - NON-DIALYSIS  200 mcg Subcutaneous Q Thu-1800  ? [START  ON 04/02/2021] doxercalciferol  1 mcg Intravenous Q T,Th,Sa-HD  ? heparin  5,000 Units Subcutaneous Q8H  ? heparin sodium (porcine)      ? pantoprazole  40 mg Oral Daily  ? ?Continuous Infusions: ? dextrose 5 % and 0.45% NaCl 50 mL/hr at 04/01/21 0814  ? ?PRN Meds:.acetaminophen **OR** acetaminophen, ondansetron **OR** ondansetron (ZOFRAN) IV, oxyCODONE-acetaminophen, traZODone ? ?Small bowel protocol x-ray reviewed ? ?Assessment/Plan: ?Mr. Zamarripa is a 46 year old male with a small bowel obstruction on CT likely secondary to adhesions from previous CAPD catheter. ? ?Small bowel protocol with contrast in colon ?Attempt clamp and clear liquids today, if he does well we will remove NG ?Some signs of partial obstruction on abdominal x-ray so if he does not pass this clamping trial, he may still need exploratory surgery ? ? LOS: 1 day  ? ?FEN: Clamp NG and clear liquids ?ID: None ?VTE: Scds, okay for chemical PPX ?Foley: None ?Dispo: Continued care on floor ? ? ? ?Felicie Morn, MD ? ?[facility] ?Use [email or web address] to contact on call provider ? ?Daily Billing: ?85277 - Moderate MDM ? ?

## 2021-04-01 NOTE — Progress Notes (Signed)
?PROGRESS NOTE ? ? ? ?Nathan Castaneda  CVE:[id number] DOB: 03-19-1975 DOA: 03/31/2021 ?PCP: Patient, No Pcp Per (Inactive)  ? ? ? ?Brief Narrative:  ? ?Nathan Castaneda is a 46 y.o. African-American male with medical history significant for essential hypertension, lupus, end-stage renal disease on hemodialysis on TTS, previously on peritoneal dialysis, pancreatitis, who presented to the ER at [facility] with acute onset diffuse abdominal pain with associated nausea and vomiting for a day, found to have SBO ? ?Subjective: ? ?On ng suction, hypoglycemia at 56 this am,  ?+ flatus this am, currently denies ab pain ? ?Assessment & Plan: ? Principal Problem: ?  SBO (small bowel obstruction) ([place]) ?Active Problems: ?  Elevated lipase ?  ESRD (end stage renal disease) ([place]) ?  Hypertension ?  GERD without esophagitis ? ? ? ?Assessment and Plan: ? ?* SBO (small bowel obstruction) (H[facility]) ?-  NG tube placed, prn analgesics, antiemetics ?- gen surg following, appear improving  ? ?Hypoglycemia ?Start hypoglycemia protocol , change iv from ns to d5halfsaline at 50cc/hr ? ?Elevated lipase ?-- This could be related to acute pancreatitis , possibly SBO, also has ESRD ?- The patient will be kept n.p.o. except for medications. ? ?ESRD (end stage renal disease) (HCC)/anemia of chronic disease ?-on lasix at home ?- lasix and  hemodialysis per nephrology ?-plan for HD on 3/25 ? ?Hypertension ?- does not appear to be on antihypertensives at home ?-bp stable ? ?GERD without esophagitis ?- currently on PPI therapy. ? ?H/o SLE ?Does not appear to be on medication  ? ? ? ?I have Reviewed nursing notes, Vitals, pain scores, I/o's, Lab results and  imaging results since pt's last encounter, details please see discussion above  ?I ordered the following labs:  ?Unresulted Labs (From admission, onward)  ? ?  Start     Ordered  ? 04/02/21 0500  CBC  Tomorrow morning,   R       ? 04/01/21 0801  ? 04/02/21 0258  Basic metabolic panel  Tomorrow  morning,   R       ? 04/01/21 0801  ? 04/02/21 0500  Lipase, blood  Tomorrow morning,   R       ? 04/01/21 0801  ? 04/01/21 1851  Hepatitis B surface antigen  (New Admission Hemo Labs (Hepatitis B))  Once,   R       ? 04/01/21 1850  ? 04/01/21 1851  Hepatitis B surface antibody  (New Admission Hemo Labs (Hepatitis B))  Once,   R       ? 04/01/21 1850  ? 04/01/21 1851  Hepatitis B surface antibody,quantitative  (New Admission Hemo Labs (Hepatitis B))  Once,   R       ? 04/01/21 1850  ? Signed and Held  Renal function panel  Once,   R       ? Signed and Held  ? Signed and Held  CBC  Once,   R       ? Signed and Held  ? ?  ?  ? ?  ? ? ? ?DVT prophylaxis: heparin injection 5,000 Units Start: 03/31/21 0600 ? ? ?Code Status:   Code Status: Full Code ? ?Family Communication: patient  ?Disposition:  ? ?Status is: Inpatient ? ? ?Dispo: The patient is from: home ?             Anticipated d/c is to: home ?  Anticipated d/c date is: TBD ? ?Antimicrobials:   ? ?Anti-infectives (From admission, onward)  ? ? None  ? ?  ? ? ? ? ? ?Objective: ?Vitals:  ? 04/01/21 0507 04/01/21 0737 04/01/21 1209 04/01/21 1630  ?BP: 109/70 107/71 120/81 115/82  ?Pulse: 73 74 77 68  ?Resp: 17 13 15 13   ?Temp: 98.6 ?F (37 ?C) 98.2 ?F (36.8 ?C) 98.6 ?F (37 ?C) 98.7 ?F (37.1 ?C)  ?TempSrc: Oral Oral Oral Oral  ?SpO2: 99% 100%    ?Weight:      ?Height:      ? ? ?Intake/Output Summary (Last 24 hours) at 04/01/2021 2001 ?Last data filed at 04/01/2021 0600 ?Gross per 24 hour  ?Intake 574.98 ml  ?Output 0 ml  ?Net 574.98 ml  ? ?Filed Weights  ? 03/31/21 0250 03/31/21 2031 04/01/21 0031  ?Weight: 66.8 kg 67.7 kg 67.5 kg  ? ? ?Examination: ? ?General exam: alert, awake, communicative,calm, NAD, ng in place ?Respiratory system: Clear to auscultation. Respiratory effort normal. ?Cardiovascular system:  RRR.  ?Gastrointestinal system: Abdomen is nondistended, soft and nontender.  Normal bowel sounds heard. ?Central nervous system: Alert and oriented. No  focal neurological deficits. ?Extremities:  no edema ?Skin: No rashes, lesions or ulcers ?Psychiatry: Judgement and insight appear normal. Mood & affect appropriate.  ? ? ? ?Data Reviewed: I have personally reviewed  labs and visualized  imaging studies since the last encounter and formulate the plan  ? ? ? ? ? ? ?Scheduled Meds: ? chlorhexidine  15 mL Mouth Rinse BID  ? Chlorhexidine Gluconate Cloth  6 each Topical Q0600  ? darbepoetin (ARANESP) injection - NON-DIALYSIS  200 mcg Subcutaneous Q Thu-1800  ? [START ON 04/02/2021] doxercalciferol  1 mcg Intravenous Q T,Th,Sa-HD  ? heparin  5,000 Units Subcutaneous Q8H  ? mouth rinse  15 mL Mouth Rinse q12n4p  ? pantoprazole  40 mg Oral Daily  ? ?Continuous Infusions: ? dextrose 5 % and 0.45% NaCl 50 mL/hr at 04/01/21 6153  ? ? ? LOS: 1 day  ? ?\ ? ? ?Florencia Reasons, MD PhD FACP ?[facility] ? ?Available via Epic secure chat 7am-7pm for nonurgent issues ?Please page for urgent issues ?To page the attending provider between 7A-7P or the covering provider during after hours 7P-7A, please log into the web site [email or web address] and access using universal [place] password for that web site. If you do not have the password, please call the hospital operator. ? ? ? ?04/01/2021, 8:01 PM  ? ? ?

## 2021-04-02 ENCOUNTER — Inpatient Hospital Stay (HOSPITAL_COMMUNITY): Payer: Medicare Other

## 2021-04-02 DIAGNOSIS — K56609 Unspecified intestinal obstruction, unspecified as to partial versus complete obstruction: Secondary | ICD-10-CM | POA: Diagnosis not present

## 2021-04-02 LAB — CBC
HCT: 24.6 % — ABNORMAL LOW (ref 39.0–52.0)
Hemoglobin: 8 g/dL — ABNORMAL LOW (ref 13.0–17.0)
MCH: 28.9 pg (ref 26.0–34.0)
MCHC: 32.5 g/dL (ref 30.0–36.0)
MCV: 88.8 fL (ref 80.0–100.0)
Platelets: 158 10*3/uL (ref 150–400)
RBC: 2.77 MIL/uL — ABNORMAL LOW (ref 4.22–5.81)
RDW: 18.8 % — ABNORMAL HIGH (ref 11.5–15.5)
WBC: 5.4 10*3/uL (ref 4.0–10.5)
nRBC: 0 % (ref 0.0–0.2)

## 2021-04-02 LAB — HEPATIC FUNCTION PANEL
ALT: 13 U/L (ref 0–44)
AST: 16 U/L (ref 15–41)
Albumin: 2.6 g/dL — ABNORMAL LOW (ref 3.5–5.0)
Alkaline Phosphatase: 61 U/L (ref 38–126)
Bilirubin, Direct: 0.1 mg/dL (ref 0.0–0.2)
Indirect Bilirubin: 0.6 mg/dL (ref 0.3–0.9)
Total Bilirubin: 0.7 mg/dL (ref 0.3–1.2)
Total Protein: 6.8 g/dL (ref 6.5–8.1)

## 2021-04-02 LAB — BASIC METABOLIC PANEL
Anion gap: 11 (ref 5–15)
BUN: 57 mg/dL — ABNORMAL HIGH (ref 6–20)
CO2: 24 mmol/L (ref 22–32)
Calcium: 8.6 mg/dL — ABNORMAL LOW (ref 8.9–10.3)
Chloride: 99 mmol/L (ref 98–111)
Creatinine, Ser: 13.49 mg/dL — ABNORMAL HIGH (ref 0.61–1.24)
GFR, Estimated: 4 mL/min — ABNORMAL LOW (ref >60)
Glucose, Bld: 90 mg/dL (ref 70–99)
Potassium: 4.1 mmol/L (ref 3.5–5.1)
Sodium: 134 mmol/L — ABNORMAL LOW (ref 135–145)

## 2021-04-02 LAB — LIPID PANEL
Cholesterol: 90 mg/dL (ref 0–200)
HDL: 31 mg/dL — ABNORMAL LOW (ref >40)
LDL Cholesterol: 45 mg/dL (ref 0–99)
Total CHOL/HDL Ratio: 2.9 RATIO
Triglycerides: 71 mg/dL (ref <150)
VLDL: 14 mg/dL (ref 0–40)

## 2021-04-02 LAB — HEPATITIS B SURFACE ANTIGEN: Hepatitis B Surface Ag: NONREACTIVE

## 2021-04-02 LAB — HEPATITIS B SURFACE ANTIBODY,QUALITATIVE: Hep B S Ab: REACTIVE — AB

## 2021-04-02 LAB — LIPASE, BLOOD: Lipase: 63 U/L — ABNORMAL HIGH (ref 11–51)

## 2021-04-02 MED ORDER — DEXTROSE-NACL 5-0.45 % IV SOLN: INTRAVENOUS | Status: AC

## 2021-04-02 MED ORDER — HEPARIN SODIUM (PORCINE) 1000 UNIT/ML IJ SOLN
INTRAMUSCULAR | Status: AC
  Administered 2021-04-02: 1000 [IU]
  Filled 2021-04-02: qty 2

## 2021-04-02 NOTE — Progress Notes (Signed)
NGT DC per order at this time. ? ?

## 2021-04-02 NOTE — Progress Notes (Signed)
? ?  Subjective/Chief Complaint: ?Patient had a bowel movement yesterday ?Still mildly distended ?Plain films shows contrast in colon, some mildly dilated SB loops ? ?Lipase still elevated - was 678 - down to 63 ? ?Objective: ?Vital signs in last 24 hours: ?Temp:  [97.8 ?F (36.6 ?C)-98.7 ?F (37.1 ?C)] 98.7 ?F (37.1 ?C) (03/25 0751) ?Pulse Rate:  [68-79] 79 (03/25 0751) ?Resp:  [13-19] 15 (03/25 0751) ?BP: (108-126)/(75-85) 112/75 (03/25 0751) ?SpO2:  [95 %-98 %] 95 % (03/25 0751) ?Last BM Date : 03/30/21 ? ?Intake/Output from previous day: ?03/24 0701 - 03/25 0700 ?In: 839.9 [I.V.:839.9] ?Out: -  ?Intake/Output this shift: ?No intake/output data recorded. ? ?WDWN in NAD ?Abd - mildly distended; mild diffuse tenderness without peritonitis ? ? ?Lab Results:  ?Recent Labs  ?  03/31/21 ?6222 04/02/21 ?9798  ?WBC 6.4 5.4  ?HGB 8.6* 8.0*  ?HCT 25.6* 24.6*  ?PLT 151 158  ? ?BMET ?Recent Labs  ?  04/01/21 ?0123 04/02/21 ?9211  ?NA 140 134*  ?K 4.3 4.1  ?CL 103 99  ?CO2 24 24  ?GLUCOSE 63* 90  ?BUN 50* 57*  ?CREATININE 11.13* 13.49*  ?CALCIUM 9.0 8.6*  ? ? ?Lipase  ?   ?Component Value Date/Time  ? LIPASE 63 (H) 04/02/2021 0227  ? ?Lipase 3/23 678 ? ?PT/INR ?No results for input(s): LABPROT, INR in the last 72 hours. ?ABG ?No results for input(s): PHART, HCO3 in the last 72 hours. ? ?Invalid input(s): PCO2, PO2 ? ?Studies/Results: ?DG Abd Portable 1V ? ?Result Date: 04/02/2021 ?CLINICAL DATA:  Small bowel obstruction EXAM: PORTABLE ABDOMEN - 1 VIEW COMPARISON:  Two days ago FINDINGS: Previously administered oral contrast is seen within colon. A few loops of small bowel remain abnormally gas distended at up to 3.7 cm. Enteric tube with tip and side port at the stomach. IMPRESSION: Partial small bowel obstruction with continued progression of enteric contrast. Electronically Signed   By: Jorje Guild M.D.   On: 04/02/2021 07:58  ? ?DG Abd Portable 1V-Small Bowel Obstruction Protocol-initial, 8 hr delay ? ?Result Date:  03/31/2021 ?CLINICAL DATA:  Follow up small bowel obstruction EXAM: PORTABLE ABDOMEN - 1 VIEW COMPARISON:  CT from the previous day. FINDINGS: Dialysis catheter is again noted from the right femoral approach. Contrast material is noted within the colon as well as within the small bowel. Dilated loops of small bowel are again identified consistent with small small-bowel obstruction. Given the colonic contrast, this is consistent with a partial small bowel obstruction. No free air is noted. Gastric catheter is noted within the stomach. IMPRESSION: Changes of partial small bowel obstruction although administered contrast now lies both within the small bowel and colon. Electronically Signed   By: Inez Catalina M.D.   On: 03/31/2021 20:41   ? ?Anti-infectives: ?Anti-infectives (From admission, onward)  ? ? None  ? ?  ? ? ?Assessment/Plan: ?SBO ?- PSH: PD catheter placement and removal ?- beginning to have some bowel function ?- SBO protocol - contrast in colon ?- D/C NG tube today- continue clears ?- patient may also have some ileus related to pancreatitis - Lipase improving ?  ?ID - none ?VTE - sq heparin ?FEN - IVF, clear liquids ?Foley - none ?  ?ESRD on HD ?Lupus ?Chronic anemia ?HTN ?GERD ?Hx pancreatitis  ? LOS: 2 days  ? ? ?Imogene Burn Aja Whitehair ?04/02/2021 ? ?

## 2021-04-02 NOTE — Progress Notes (Addendum)
?                                  PROGRESS NOTE                                             ?                                                                                                                     ?                                         ? ? Patient Demographics:  ? ? Nathan Castaneda, is a 46 y.o. male, DOB - 07-19-1975, ZOX:[id number] ? ?Outpatient Primary MD for the patient is Patient, No Pcp Per (Inactive)    LOS - 2  Admit date - 03/31/2021   ? ?No chief complaint on file. ?    ? ?Brief Narrative (HPI from H&P)  - 46 y.o. African-American male with medical history significant for essential hypertension, lupus, end-stage renal disease on hemodialysis on TTS, previously on peritoneal dialysis, pancreatitis, who presented to the ER at [facility] with acute onset diffuse abdominal pain with associated nausea and vomiting for a day, found to have SBO. ? ? Subjective:  ? ? Nathan Castaneda today has, No headache, No chest pain, No abdominal pain - No Nausea, No new weakness tingling or numbness, no SOB ? ? Assessment  & Plan :  ? ? ?Small bowel obstruction most likely partial in the setting of acute pancreatitis.  Being followed by general surgery pancreatitis has improved, is having some bowel activity, NG tube to be removed by general surgery on 04/02/2021.  Monitor with supportive care. ? ?2. ESRD - Lupus -on HD, nephrology following. TTS,  Continue to monitor. ? ?3. H/O Pancreatitis - monitor LFTs, no active alcohol use, check IgG4 levels, right upper quadrant ultrasound along with triglycerides, clinically resolved. ? ?4. GERD - PPI. ? ?   ? ?Condition - Fair ? ?Family Communication  :  None present ? ?Code Status :  Full ? ?Consults  :  CCS, Renal ? ?PUD Prophylaxis :  PPI ? ? Procedures  :    ? ? ? ?   ? ?Disposition Plan  :   ? ?Status is: Inpatient ? ? ?DVT Prophylaxis  :   ? ?heparin injection 5,000 Units Start: 03/31/21 0600 ? ?Lab Results  ?Component Value Date   ? PLT 158 04/02/2021  ? ? ?Diet :  ?Diet Order   ? ?       ?  Diet clear liquid Room service appropriate? Yes; Fluid consistency: Thin  Diet effective now       ?  ? ?  ?  ? ?  ?  ? ?  Inpatient Medications ? ?Scheduled Meds: ? chlorhexidine  15 mL Mouth Rinse BID  ? Chlorhexidine Gluconate Cloth  6 each Topical Q0600  ? darbepoetin (ARANESP) injection - NON-DIALYSIS  200 mcg Subcutaneous Q Thu-1800  ? doxercalciferol  1 mcg Intravenous Q T,Th,Sa-HD  ? heparin  5,000 Units Subcutaneous Q8H  ? heparin sodium (porcine)      ? mouth rinse  15 mL Mouth Rinse q12n4p  ? pantoprazole  40 mg Oral Daily  ? ?Continuous Infusions: ?PRN Meds:.acetaminophen **OR** acetaminophen, ondansetron **OR** ondansetron (ZOFRAN) IV, oxyCODONE-acetaminophen, traZODone ? ?Antibiotics  :   ? ?Anti-infectives (From admission, onward)  ? ? None  ? ?  ? ? ? Time Spent in minutes  30 ? ? ?Lala Lund M.D on 04/02/2021 at 10:20 AM ? ?To page go to [email or web address]  ? ?[facility] -  Office  [phone] ? ?See all Orders from today for further details ? ? ? Objective:  ? ?Vitals:  ? 04/01/21 2000 04/02/21 0000 04/02/21 0516 04/02/21 0751  ?BP: 126/85 120/84 108/76 112/75  ?Pulse: 72 74  79  ?Resp: 19 15 16 15   ?Temp: 98.3 ?F (36.8 ?C) 97.8 ?F (36.6 ?C) 98.3 ?F (36.8 ?C) 98.7 ?F (37.1 ?C)  ?TempSrc: Oral Axillary Oral Oral  ?SpO2: 98% 96% 96% 95%  ?Weight:      ?Height:      ? ? ?Wt Readings from Last 3 Encounters:  ?04/01/21 67.5 kg  ?12/15/20 76 kg  ?10/20/19 75.3 kg  ? ? ? ?Intake/Output Summary (Last 24 hours) at 04/02/2021 1020 ?Last data filed at 04/02/2021 0132 ?Gross per 24 hour  ?Intake 839.85 ml  ?Output --  ?Net 839.85 ml  ? ? ? ?Physical Exam ? ?Awake Alert, No new F.N deficits, Normal affect ?[place].AT,PERRAL ?Supple Neck, No JVD,   ?Symmetrical Chest wall movement, Good air movement bilaterally, CTAB ?RRR,No Gallops,Rubs or new Murmurs,  ?hypoactive B.Sounds, Abd Soft, No tenderness, +ve NG ?No Cyanosis, Clubbing or edema  ?  ? ? Data  Review:  ? ? ?CBC ?Recent Labs  ?Lab 03/31/21 ?2671 04/02/21 ?2458  ?WBC 6.4 5.4  ?HGB 8.6* 8.0*  ?HCT 25.6* 24.6*  ?PLT 151 158  ?MCV 87.7 88.8  ?MCH 29.5 28.9  ?MCHC 33.6 32.5  ?RDW 18.6* 18.8*  ? ? ?Electrolytes ?Recent Labs  ?Lab 03/31/21 ?0998 04/01/21 ?0123 04/02/21 ?3382  ?NA 137 140 134*  ?K 5.1 4.3 4.1  ?CL 100 103 99  ?CO2 26 24 24   ?GLUCOSE 84 63* 90  ?BUN 62* 50* 57*  ?CREATININE 13.06* 11.13* 13.49*  ?CALCIUM 9.3 9.0 8.6*  ?ALBUMIN  --  2.8*  --   ?MG  --  2.1  --   ? ? ? ? ?Radiology Reports ?DG Abd Portable 1V ? ?Result Date: 04/02/2021 ?CLINICAL DATA:  Small bowel obstruction EXAM: PORTABLE ABDOMEN - 1 VIEW COMPARISON:  Two days ago FINDINGS: Previously administered oral contrast is seen within colon. A few loops of small bowel remain abnormally gas distended at up to 3.7 cm. Enteric tube with tip and side port at the stomach. IMPRESSION: Partial small bowel obstruction with continued progression of enteric contrast. Electronically Signed   By: Jorje Guild M.D.   On: 04/02/2021 07:58  ? ?DG Abd Portable 1V-Small Bowel Obstruction Protocol-initial, 8 hr delay ? ?Result Date: 03/31/2021 ?CLINICAL DATA:  Follow up small bowel obstruction EXAM: PORTABLE ABDOMEN - 1 VIEW COMPARISON:  CT from the previous day. FINDINGS: Dialysis catheter is again noted from the right femoral  approach. Contrast material is noted within the colon as well as within the small bowel. Dilated loops of small bowel are again identified consistent with small small-bowel obstruction. Given the colonic contrast, this is consistent with a partial small bowel obstruction. No free air is noted. Gastric catheter is noted within the stomach. IMPRESSION: Changes of partial small bowel obstruction although administered contrast now lies both within the small bowel and colon. Electronically Signed   By: Inez Catalina M.D.   On: 03/31/2021 20:41    ? ? ?

## 2021-04-02 NOTE — Progress Notes (Signed)
Report given to hemodialysis, rn at this time. HD consent confirmed in pt chart. Awaiting transport. ?

## 2021-04-02 NOTE — Progress Notes (Signed)
?[facility] ?Progress Note  ? ?Subjective:  Seen at onset of HD. NG tube out, has tolerated clear diet per notes. Denies CP or dyspnea today, but having intermittent hiccups. ? ?Objective ?Vitals:  ? 04/02/21 0000 04/02/21 0516 04/02/21 0751 04/02/21 1156  ?BP: 120/84 108/76 112/75 104/73  ?Pulse: 74  79 79  ?Resp: 15 16 15 11   ?Temp: 97.8 ?F (36.6 ?C) 98.3 ?F (36.8 ?C) 98.7 ?F (37.1 ?C) 98.5 ?F (36.9 ?C)  ?TempSrc: Axillary Oral Oral Oral  ?SpO2: 96% 96% 95% 94%  ?Weight:      ?Height:      ? ?Physical Exam ?General: Well appearing, NAD. NG tube is out. ?Heart: RRR; no murmur ?Lungs: CTA anteriorly ?Abdomen: soft, non-tender ?Extremities: No LE edema ?Dialysis Access: R femoral TDC ? ?Additional Objective ?Labs: ?Basic Metabolic Panel: ?Recent Labs  ?Lab 03/31/21 ?3295 04/01/21 ?0123 04/02/21 ?1884  ?NA 137 140 134*  ?K 5.1 4.3 4.1  ?CL 100 103 99  ?CO2 26 24 24   ?GLUCOSE 84 63* 90  ?BUN 62* 50* 57*  ?CREATININE 13.06* 11.13* 13.49*  ?CALCIUM 9.3 9.0 8.6*  ?PHOS 8.0* 6.0*  --   ? ?Liver Function Tests: ?Recent Labs  ?Lab 04/01/21 ?0123 04/02/21 ?1660  ?AST  --  16  ?ALT  --  13  ?ALKPHOS  --  61  ?BILITOT  --  0.7  ?PROT  --  6.8  ?ALBUMIN 2.8* 2.6*  ? ?Recent Labs  ?Lab 03/31/21 ?6301 04/02/21 ?6010  ?LIPASE 678* 63*  ? ?CBC: ?Recent Labs  ?Lab 03/31/21 ?9323 04/02/21 ?5573  ?WBC 6.4 5.4  ?HGB 8.6* 8.0*  ?HCT 25.6* 24.6*  ?MCV 87.7 88.8  ?PLT 151 158  ? ?Studies/Results: ?DG Abd Portable 1V ? ?Result Date: 04/02/2021 ?CLINICAL DATA:  Small bowel obstruction EXAM: PORTABLE ABDOMEN - 1 VIEW COMPARISON:  Two days ago FINDINGS: Previously administered oral contrast is seen within colon. A few loops of small bowel remain abnormally gas distended at up to 3.7 cm. Enteric tube with tip and side port at the stomach. IMPRESSION: Partial small bowel obstruction with continued progression of enteric contrast. Electronically Signed   By: Jorje Guild M.D.   On: 04/02/2021 07:58  ? ?DG Abd Portable 1V-Small  Bowel Obstruction Protocol-initial, 8 hr delay ? ?Result Date: 03/31/2021 ?CLINICAL DATA:  Follow up small bowel obstruction EXAM: PORTABLE ABDOMEN - 1 VIEW COMPARISON:  CT from the previous day. FINDINGS: Dialysis catheter is again noted from the right femoral approach. Contrast material is noted within the colon as well as within the small bowel. Dilated loops of small bowel are again identified consistent with small small-bowel obstruction. Given the colonic contrast, this is consistent with a partial small bowel obstruction. No free air is noted. Gastric catheter is noted within the stomach. IMPRESSION: Changes of partial small bowel obstruction although administered contrast now lies both within the small bowel and colon. Electronically Signed   By: Inez Catalina M.D.   On: 03/31/2021 20:41   ? ?Medications: ? dextrose 5 % and 0.45% NaCl 35 mL/hr at 04/02/21 1153  ? ? chlorhexidine  15 mL Mouth Rinse BID  ? Chlorhexidine Gluconate Cloth  6 each Topical Q0600  ? darbepoetin (ARANESP) injection - NON-DIALYSIS  200 mcg Subcutaneous Q Thu-1800  ? doxercalciferol  1 mcg Intravenous Q T,Th,Sa-HD  ? heparin  5,000 Units Subcutaneous Q8H  ? heparin sodium (porcine)      ? mouth rinse  15 mL Mouth Rinse q12n4p  ?  pantoprazole  40 mg Oral Daily  ? ? ?Dialysis Orders: ?TTS [facility] ? 4h  400/600 67.5kg  2/2 bath  R fem TDC  Hep 2900 ? - hectorol 1 ug tiw ?  ?Assessment/Plan: ?1. Small Bowel Obstruction: Likely from adhesions from previous CAPD v. mild pancreatitis ileus picture. NG tube removed this AM, tolerating clear diet. ?2. ESRD: HD TTS - HD today. ?3. HTN/volume: BP controlled, low UFG today. No edema. ?4. Anemia of Chronic Kidney disease: Hgb 8 - due for ESA next on 3/31 if still here.  ?5. Secondary hyperparathyroidism: Ca ok, Phos a little high - binders on hold until cleared for solid diet. ?6. Nutrition: Clear liquid diet --> advancing per surgery team. ?  ? ?Veneta Penton, PA-C ?04/02/2021, 1:34 PM  ?[facility] ? ? ? ?

## 2021-04-03 ENCOUNTER — Inpatient Hospital Stay (HOSPITAL_COMMUNITY): Payer: Medicare Other

## 2021-04-03 DIAGNOSIS — K56609 Unspecified intestinal obstruction, unspecified as to partial versus complete obstruction: Secondary | ICD-10-CM | POA: Diagnosis not present

## 2021-04-03 LAB — CBC WITH DIFFERENTIAL/PLATELET
Abs Immature Granulocytes: 0.01 10*3/uL (ref 0.00–0.07)
Basophils Absolute: 0 10*3/uL (ref 0.0–0.1)
Basophils Relative: 0 %
Eosinophils Absolute: 0.1 10*3/uL (ref 0.0–0.5)
Eosinophils Relative: 1 %
HCT: 23.6 % — ABNORMAL LOW (ref 39.0–52.0)
Hemoglobin: 7.8 g/dL — ABNORMAL LOW (ref 13.0–17.0)
Immature Granulocytes: 0 %
Lymphocytes Relative: 40 %
Lymphs Abs: 2 10*3/uL (ref 0.7–4.0)
MCH: 28.8 pg (ref 26.0–34.0)
MCHC: 33.1 g/dL (ref 30.0–36.0)
MCV: 87.1 fL (ref 80.0–100.0)
Monocytes Absolute: 0.4 10*3/uL (ref 0.1–1.0)
Monocytes Relative: 8 %
Neutro Abs: 2.6 10*3/uL (ref 1.7–7.7)
Neutrophils Relative %: 51 %
Platelets: 135 10*3/uL — ABNORMAL LOW (ref 150–400)
RBC: 2.71 MIL/uL — ABNORMAL LOW (ref 4.22–5.81)
RDW: 18.1 % — ABNORMAL HIGH (ref 11.5–15.5)
WBC: 5.1 10*3/uL (ref 4.0–10.5)
nRBC: 0 % (ref 0.0–0.2)

## 2021-04-03 LAB — COMPREHENSIVE METABOLIC PANEL
ALT: 15 U/L (ref 0–44)
AST: 20 U/L (ref 15–41)
Albumin: 2.4 g/dL — ABNORMAL LOW (ref 3.5–5.0)
Alkaline Phosphatase: 73 U/L (ref 38–126)
Anion gap: 8 (ref 5–15)
BUN: 23 mg/dL — ABNORMAL HIGH (ref 6–20)
CO2: 27 mmol/L (ref 22–32)
Calcium: 8.1 mg/dL — ABNORMAL LOW (ref 8.9–10.3)
Chloride: 98 mmol/L (ref 98–111)
Creatinine, Ser: 8.17 mg/dL — ABNORMAL HIGH (ref 0.61–1.24)
GFR, Estimated: 8 mL/min — ABNORMAL LOW (ref >60)
Glucose, Bld: 84 mg/dL (ref 70–99)
Potassium: 3.7 mmol/L (ref 3.5–5.1)
Sodium: 133 mmol/L — ABNORMAL LOW (ref 135–145)
Total Bilirubin: 0.7 mg/dL (ref 0.3–1.2)
Total Protein: 6.4 g/dL — ABNORMAL LOW (ref 6.5–8.1)

## 2021-04-03 LAB — MAGNESIUM: Magnesium: 1.7 mg/dL (ref 1.7–2.4)

## 2021-04-03 LAB — LIPASE, BLOOD: Lipase: 45 U/L (ref 11–51)

## 2021-04-03 NOTE — Progress Notes (Signed)
?                                  PROGRESS NOTE                                             ?                                                                                                                     ?                                         ? ? Patient Demographics:  ? ? Nathan Castaneda, is a 46 y.o. male, DOB - Oct 14, 1975, [id number] ? ?Outpatient Primary MD for the patient is Patient, No Pcp Per (Inactive)    LOS - 3  Admit date - 03/31/2021   ? ?No chief complaint on file. ?    ? ?Brief Narrative (HPI from H&P)  - 46 y.o. African-American male with medical history significant for essential hypertension, lupus, end-stage renal disease on hemodialysis on TTS, previously on peritoneal dialysis, pancreatitis, who presented to the ER at [facility] with acute onset diffuse abdominal pain with associated nausea and vomiting for a day, found to have SBO. ? ? Subjective:  ? ?Patient in bed, appears comfortable, denies any headache, no fever, no chest pain or pressure, no shortness of breath , mild epigastric abdominal pain. No new focal weakness. ? ? Assessment  & Plan :  ? ? ?Small bowel obstruction most likely partial in the setting of acute pancreatitis.  Being followed by general surgery pancreatitis has improved, is having some bowel activity, NG tube was removed by general surgery on 04/02/2021.  Currently on clear liquid diet with still some epigastric discomfort, will defer management of this issue to general surgery. ? ?2. ESRD - Lupus -on HD, nephrology following. TTS,  Continue to monitor. ? ?3. H/O Pancreatitis - remote history of alcohol abuse but none in the last year, LFTs stable, right upper quadrant ultrasound shows stable CBD, stable triglycerides, IgG4 levels pending.  Lipase levels trending down, if better outpatient GI follow-up. ? ?4. GERD - PPI. ? ?   ? ?Condition - Fair ? ?Family Communication  :  None present ? ?Code Status :  Full ? ?Consults  :   CCS, Renal ? ?PUD Prophylaxis :  PPI ? ? Procedures  :    ? ? ? ?   ? ?Disposition Plan  :   ? ?Status is: Inpatient ? ? ?DVT Prophylaxis  :   ? ?heparin injection 5,000 Units Start: 03/31/21 0600 ? ?Lab Results  ?Component Value Date  ? PLT 135 (L) 04/03/2021  ? ? ?  Diet :  ?Diet Order   ? ?       ?  Diet clear liquid Room service appropriate? Yes; Fluid consistency: Thin  Diet effective now       ?  ? ?  ?  ? ?  ?  ? ?Inpatient Medications ? ?Scheduled Meds: ? chlorhexidine  15 mL Mouth Rinse BID  ? Chlorhexidine Gluconate Cloth  6 each Topical Q0600  ? darbepoetin (ARANESP) injection - NON-DIALYSIS  200 mcg Subcutaneous Q Thu-1800  ? doxercalciferol  1 mcg Intravenous Q T,Th,Sa-HD  ? heparin  5,000 Units Subcutaneous Q8H  ? mouth rinse  15 mL Mouth Rinse q12n4p  ? pantoprazole  40 mg Oral Daily  ? ?Continuous Infusions: ? dextrose 5 % and 0.45% NaCl 35 mL/hr at 04/03/21 0122  ? ?PRN Meds:.acetaminophen **OR** acetaminophen, ondansetron **OR** ondansetron (ZOFRAN) IV, oxyCODONE-acetaminophen, traZODone ? ?Antibiotics  :   ? ?Anti-infectives (From admission, onward)  ? ? None  ? ?  ? ? ? Time Spent in minutes  30 ? ? ?Lala Lund M.D on 04/03/2021 at 9:00 AM ? ?To page go to [email or web address]  ? ?[facility] -  Office  [phone] ? ?See all Orders from today for further details ? ? ? Objective:  ? ?Vitals:  ? 04/02/21 1736 04/02/21 2042 04/03/21 0056 04/03/21 0759  ?BP: 99/73 105/79 101/68 106/61  ?Pulse: 84 76 82 75  ?Resp:  (!) 24 17 19   ?Temp: 99.4 ?F (37.4 ?C) 99.2 ?F (37.3 ?C) 99 ?F (37.2 ?C) 98.7 ?F (37.1 ?C)  ?TempSrc: Oral Oral Oral Oral  ?SpO2: 92% 96% 97% 98%  ?Weight:      ?Height:      ? ? ?Wt Readings from Last 3 Encounters:  ?04/02/21 68.5 kg  ?12/15/20 76 kg  ?10/20/19 75.3 kg  ? ? ? ?Intake/Output Summary (Last 24 hours) at 04/03/2021 0900 ?Last data filed at 04/03/2021 0120 ?Gross per 24 hour  ?Intake 471 ml  ?Output 500 ml  ?Net -29 ml  ? ? ? ?Physical Exam ? ?Awake Alert, No new F.N deficits,  Normal affect ?[place].AT,PERRAL ?Supple Neck, No JVD,   ?Symmetrical Chest wall movement, Good air movement bilaterally, CTAB ?RRR,No Gallops, Rubs or new Murmurs,  ?+ve B.Sounds, Abd Soft, mild epigastric tenderness,   ?No Cyanosis, Clubbing or edema  ? ?  ? ? Data Review:  ? ? ?CBC ?Recent Labs  ?Lab 03/31/21 ?9798 04/02/21 ?9211 04/03/21 ?0304  ?WBC 6.4 5.4 5.1  ?HGB 8.6* 8.0* 7.8*  ?HCT 25.6* 24.6* 23.6*  ?PLT 151 158 135*  ?MCV 87.7 88.8 87.1  ?MCH 29.5 28.9 28.8  ?MCHC 33.6 32.5 33.1  ?RDW 18.6* 18.8* 18.1*  ?LYMPHSABS  --   --  2.0  ?MONOABS  --   --  0.4  ?EOSABS  --   --  0.1  ?BASOSABS  --   --  0.0  ? ? ?Electrolytes ?Recent Labs  ?Lab 03/31/21 ?9417 04/01/21 ?0123 04/02/21 ?4081 04/03/21 ?0304  ?NA 137 140 134* 133*  ?K 5.1 4.3 4.1 3.7  ?CL 100 103 99 98  ?CO2 26 24 24 27   ?GLUCOSE 84 63* 90 84  ?BUN 62* 50* 57* 23*  ?CREATININE 13.06* 11.13* 13.49* 8.17*  ?CALCIUM 9.3 9.0 8.6* 8.1*  ?AST  --   --  16 20  ?ALT  --   --  13 15  ?ALKPHOS  --   --  61 73  ?BILITOT  --   --  0.7 0.7  ?  ALBUMIN  --  2.8* 2.6* 2.4*  ?MG  --  2.1  --  1.7  ? ? ? ? ?Radiology Reports ?US Abdomen Complete ? ?Result Date: 04/02/2021 ?CLINICAL DATA:  Known history of small-bowel obstruction and abdominal pain, initial encounter EXAM: ABDOMEN ULTRASOUND COMPLETE COMPARISON:  CT from 03/30/2021 FINDINGS: Gallbladder: No gallstones or wall thickening visualized. No sonographic Murphy sign noted by sonographer. Common bile duct: Diameter: 7.8 mm Liver: No focal lesion identified. Within normal limits in parenchymal echogenicity. Portal vein is patent on color Doppler imaging with normal direction of blood flow towards the liver. IVC: No abnormality visualized. Pancreas: Visualized. Spleen: Size and appearance within normal limits. Right Kidney: Length: 8.9 cm. Mild increased echogenicity is noted. Upper pole cyst is noted similar to that seen on prior CT examination measuring 3 cm. Smaller cysts seen on prior CT are not well appreciated on  this exam. Left Kidney: Length: 10.4 cm. Mild increased echogenicity is noted. Multiple cysts are noted the largest of which measures 1.9 cm similar to that seen on prior CT examination. Abdominal aorta: No aneurysm visualized. Other findings: Mild ascites is noted. IMPRESSION: Mild ascites. Bilateral renal cystic changes stable from previous CT. Nonvisualization of the pancreas. Electronically Signed   By: Inez Catalina M.D.   On: 04/02/2021 20:57  ? ?DG Chest Port 1 View ? ?Result Date: 04/03/2021 ?CLINICAL DATA:  46 year old male with history of abdominal distension and shortness of breath. EXAM: PORTABLE CHEST 1 VIEW COMPARISON:  Chest x-ray 03/30/2021. FINDINGS: The tip of a PermCath is noted in the right atrium, presumably from femoral approach. Left subclavian HERO graft tip terminates at the superior cavoatrial junction. Lung volumes are normal. No consolidative airspace disease. No pleural effusions. No pneumothorax. No pulmonary nodule or mass noted. Pulmonary vasculature and the cardiomediastinal silhouette are within normal limits. Atherosclerosis in the thoracic aorta. IMPRESSION: 1. Support apparatus, as above. 2. No radiographic evidence of acute cardiopulmonary disease. 3. Aortic atherosclerosis. Electronically Signed   By: Vinnie Langton M.D.   On: 04/03/2021 07:41  ? ?DG Abd Portable 1V ? ?Result Date: 04/03/2021 ?CLINICAL DATA:  46 year old male with history of abdominal distension. Shortness of breath. EXAM: PORTABLE ABDOMEN - 1 VIEW COMPARISON:  04/02/2021. FINDINGS: Previously noted nasogastric tube has been removed. There continues to be multiple dilated loops of small bowel in the central abdomen measuring up to 3.9 cm in diameter. There is a small amount of gas and oral contrast material in the colon and rectum. No definite pneumoperitoneum confidently identified on this single supine view. IMPRESSION: 1. Evidence of persistent partial small bowel obstruction, similar to the prior study, as  above. Nasogastric tube has been removed. Electronically Signed   By: Vinnie Langton M.D.   On: 04/03/2021 07:39  ? ?DG Abd Portable 1V ? ?Result Date: 04/02/2021 ?CLINICAL DATA:  Small bowel obstruct

## 2021-04-03 NOTE — Progress Notes (Signed)
?[facility] ?Progress Note  ? ?Subjective:  Seen in room. No CP, no abdominal pain, no dyspnea. HD went fine yesterday. Diet being advanced today to soft foods.  ? ?Objective ?Vitals:  ? 04/02/21 1736 04/02/21 2042 04/03/21 0056 04/03/21 0759  ?BP: 99/73 105/79 101/68 106/61  ?Pulse: 84 76 82 75  ?Resp:  (!) 24 17 19   ?Temp: 99.4 ?F (37.4 ?C) 99.2 ?F (37.3 ?C) 99 ?F (37.2 ?C) 98.7 ?F (37.1 ?C)  ?TempSrc: Oral Oral Oral Oral  ?SpO2: 92% 96% 97% 98%  ?Weight:      ?Height:      ? ?Physical Exam ?General: Well appearing, NAD.  ?Heart: RRR; no murmur ?Lungs: CTA anteriorly ?Abdomen: soft, non-tender ?Extremities: No LE edema ?Dialysis Access: R femoral TDC ? ?Additional Objective ?Labs: ?Basic Metabolic Panel: ?Recent Labs  ?Lab 03/31/21 ?0737 04/01/21 ?0123 04/02/21 ?1062 04/03/21 ?0304  ?NA 137 140 134* 133*  ?K 5.1 4.3 4.1 3.7  ?CL 100 103 99 98  ?CO2 26 24 24 27   ?GLUCOSE 84 63* 90 84  ?BUN 62* 50* 57* 23*  ?CREATININE 13.06* 11.13* 13.49* 8.17*  ?CALCIUM 9.3 9.0 8.6* 8.1*  ?PHOS 8.0* 6.0*  --   --   ? ?Liver Function Tests: ?Recent Labs  ?Lab 04/01/21 ?0123 04/02/21 ?0227 04/03/21 ?0304  ?AST  --  16 20  ?ALT  --  13 15  ?ALKPHOS  --  69 48  ?BILITOT  --  0.7 0.7  ?PROT  --  6.8 6.4*  ?ALBUMIN 2.8* 2.6* 2.4*  ? ?Recent Labs  ?Lab 03/31/21 ?5462 04/02/21 ?7035 04/03/21 ?0304  ?LIPASE 678* 63* 45  ? ?CBC: ?Recent Labs  ?Lab 03/31/21 ?0093 04/02/21 ?8182 04/03/21 ?0304  ?WBC 6.4 5.4 5.1  ?NEUTROABS  --   --  2.6  ?HGB 8.6* 8.0* 7.8*  ?HCT 25.6* 24.6* 23.6*  ?MCV 87.7 88.8 87.1  ?PLT 151 158 135*  ? ?Studies/Results: ?US Abdomen Complete ? ?Result Date: 04/02/2021 ?CLINICAL DATA:  Known history of small-bowel obstruction and abdominal pain, initial encounter EXAM: ABDOMEN ULTRASOUND COMPLETE COMPARISON:  CT from 03/30/2021 FINDINGS: Gallbladder: No gallstones or wall thickening visualized. No sonographic Murphy sign noted by sonographer. Common bile duct: Diameter: 7.8 mm Liver: No focal lesion  identified. Within normal limits in parenchymal echogenicity. Portal vein is patent on color Doppler imaging with normal direction of blood flow towards the liver. IVC: No abnormality visualized. Pancreas: Visualized. Spleen: Size and appearance within normal limits. Right Kidney: Length: 8.9 cm. Mild increased echogenicity is noted. Upper pole cyst is noted similar to that seen on prior CT examination measuring 3 cm. Smaller cysts seen on prior CT are not well appreciated on this exam. Left Kidney: Length: 10.4 cm. Mild increased echogenicity is noted. Multiple cysts are noted the largest of which measures 1.9 cm similar to that seen on prior CT examination. Abdominal aorta: No aneurysm visualized. Other findings: Mild ascites is noted. IMPRESSION: Mild ascites. Bilateral renal cystic changes stable from previous CT. Nonvisualization of the pancreas. Electronically Signed   By: Inez Catalina M.D.   On: 04/02/2021 20:57  ? ?DG Chest Port 1 View ? ?Result Date: 04/03/2021 ?CLINICAL DATA:  46 year old male with history of abdominal distension and shortness of breath. EXAM: PORTABLE CHEST 1 VIEW COMPARISON:  Chest x-ray 03/30/2021. FINDINGS: The tip of a PermCath is noted in the right atrium, presumably from femoral approach. Left subclavian HERO graft tip terminates at the superior cavoatrial junction. Lung volumes are normal. No consolidative  airspace disease. No pleural effusions. No pneumothorax. No pulmonary nodule or mass noted. Pulmonary vasculature and the cardiomediastinal silhouette are within normal limits. Atherosclerosis in the thoracic aorta. IMPRESSION: 1. Support apparatus, as above. 2. No radiographic evidence of acute cardiopulmonary disease. 3. Aortic atherosclerosis. Electronically Signed   By: Vinnie Langton M.D.   On: 04/03/2021 07:41  ? ?DG Abd Portable 1V ? ?Result Date: 04/03/2021 ?CLINICAL DATA:  46 year old male with history of abdominal distension. Shortness of breath. EXAM: PORTABLE ABDOMEN  - 1 VIEW COMPARISON:  04/02/2021. FINDINGS: Previously noted nasogastric tube has been removed. There continues to be multiple dilated loops of small bowel in the central abdomen measuring up to 3.9 cm in diameter. There is a small amount of gas and oral contrast material in the colon and rectum. No definite pneumoperitoneum confidently identified on this single supine view. IMPRESSION: 1. Evidence of persistent partial small bowel obstruction, similar to the prior study, as above. Nasogastric tube has been removed. Electronically Signed   By: Vinnie Langton M.D.   On: 04/03/2021 07:39  ? ?DG Abd Portable 1V ? ?Result Date: 04/02/2021 ?CLINICAL DATA:  Small bowel obstruction EXAM: PORTABLE ABDOMEN - 1 VIEW COMPARISON:  Two days ago FINDINGS: Previously administered oral contrast is seen within colon. A few loops of small bowel remain abnormally gas distended at up to 3.7 cm. Enteric tube with tip and side port at the stomach. IMPRESSION: Partial small bowel obstruction with continued progression of enteric contrast. Electronically Signed   By: Jorje Guild M.D.   On: 04/02/2021 07:58   ? ?Medications: ? dextrose 5 % and 0.45% NaCl 35 mL/hr at 04/03/21 0122  ? ? chlorhexidine  15 mL Mouth Rinse BID  ? Chlorhexidine Gluconate Cloth  6 each Topical Q0600  ? darbepoetin (ARANESP) injection - NON-DIALYSIS  200 mcg Subcutaneous Q Thu-1800  ? doxercalciferol  1 mcg Intravenous Q T,Th,Sa-HD  ? heparin  5,000 Units Subcutaneous Q8H  ? mouth rinse  15 mL Mouth Rinse q12n4p  ? pantoprazole  40 mg Oral Daily  ? ? ?Dialysis Orders: ?TTS [facility] ? 4h  400/600 67.5kg  2/2 bath  R fem TDC  Hep 2900 ? - hectorol 1 ug tiw ?  ?Assessment/Plan: ?1. Small Bowel Obstruction: Likely from adhesions from previous CAPD v. pancreatitis ileus picture. NG tube removed, diet being advanced. ?2. ESRD: HD TTS - next 3/28. ?3. HTN/volume: BP controlled, no edema. ?4. Anemia of Chronic Kidney disease: Hgb 7.8 - due for ESA next on 3/31 if still  here.  ?5. Secondary hyperparathyroidism: Ca ok, Phos a little high - binders on hold until cleared for solid diet. ?6. Nutrition: Diet being advanced by surgery team. ?7. Pancreatitis: Lipase trending down (678 -> 45 today). ? ?Veneta Penton, PA-C ?04/03/2021, 11:11 AM  ?[facility] ? ? ? ?

## 2021-04-03 NOTE — Progress Notes (Signed)
? ?Subjective/Chief Complaint: ?Patient feels better ?Passing a large amount of flatus, no BM ?Less distended, less tender ?Dialysis yesterday ? ?Hungry ? ? ?Objective: ?Vital signs in last 24 hours: ?Temp:  [97.8 ?F (36.6 ?C)-99.4 ?F (37.4 ?C)] 98.7 ?F (37.1 ?C) (03/26 0759) ?Pulse Rate:  [64-86] 75 (03/26 0759) ?Resp:  [11-24] 19 (03/26 0759) ?BP: (96-106)/(61-79) 106/61 (03/26 0759) ?SpO2:  [92 %-99 %] 98 % (03/26 0759) ?Weight:  [68.5 kg-69.2 kg] 68.5 kg (03/25 1715) ?Last BM Date : 04/01/21 ? ?Intake/Output from previous day: ?03/25 0701 - 03/26 0700 ?In: 471 [I.V.:471] ?Out: 500  ?Intake/Output this shift: ?No intake/output data recorded. ? ?WDWN in NAD ?Abd - less distended, minimal tenderness ?Lab Results:  ?Recent Labs  ?  04/02/21 ?0227 04/03/21 ?0304  ?WBC 5.4 5.1  ?HGB 8.0* 7.8*  ?HCT 24.6* 23.6*  ?PLT 158 135*  ? ?BMET ?Recent Labs  ?  04/02/21 ?0227 04/03/21 ?0304  ?NA 134* 133*  ?K 4.1 3.7  ?CL 99 98  ?CO2 24 27  ?GLUCOSE 90 84  ?BUN 57* 23*  ?CREATININE 13.49* 8.17*  ?CALCIUM 8.6* 8.1*  ? ?PT/INR ?No results for input(s): LABPROT, INR in the last 72 hours. ?ABG ?No results for input(s): PHART, HCO3 in the last 72 hours. ? ?Invalid input(s): PCO2, PO2 ? ?Studies/Results: ?US Abdomen Complete ? ?Result Date: 04/02/2021 ?CLINICAL DATA:  Known history of small-bowel obstruction and abdominal pain, initial encounter EXAM: ABDOMEN ULTRASOUND COMPLETE COMPARISON:  CT from 03/30/2021 FINDINGS: Gallbladder: No gallstones or wall thickening visualized. No sonographic Murphy sign noted by sonographer. Common bile duct: Diameter: 7.8 mm Liver: No focal lesion identified. Within normal limits in parenchymal echogenicity. Portal vein is patent on color Doppler imaging with normal direction of blood flow towards the liver. IVC: No abnormality visualized. Pancreas: Visualized. Spleen: Size and appearance within normal limits. Right Kidney: Length: 8.9 cm. Mild increased echogenicity is noted. Upper pole cyst is  noted similar to that seen on prior CT examination measuring 3 cm. Smaller cysts seen on prior CT are not well appreciated on this exam. Left Kidney: Length: 10.4 cm. Mild increased echogenicity is noted. Multiple cysts are noted the largest of which measures 1.9 cm similar to that seen on prior CT examination. Abdominal aorta: No aneurysm visualized. Other findings: Mild ascites is noted. IMPRESSION: Mild ascites. Bilateral renal cystic changes stable from previous CT. Nonvisualization of the pancreas. Electronically Signed   By: Inez Catalina M.D.   On: 04/02/2021 20:57  ? ?DG Chest Port 1 View ? ?Result Date: 04/03/2021 ?CLINICAL DATA:  46 year old male with history of abdominal distension and shortness of breath. EXAM: PORTABLE CHEST 1 VIEW COMPARISON:  Chest x-ray 03/30/2021. FINDINGS: The tip of a PermCath is noted in the right atrium, presumably from femoral approach. Left subclavian HERO graft tip terminates at the superior cavoatrial junction. Lung volumes are normal. No consolidative airspace disease. No pleural effusions. No pneumothorax. No pulmonary nodule or mass noted. Pulmonary vasculature and the cardiomediastinal silhouette are within normal limits. Atherosclerosis in the thoracic aorta. IMPRESSION: 1. Support apparatus, as above. 2. No radiographic evidence of acute cardiopulmonary disease. 3. Aortic atherosclerosis. Electronically Signed   By: Vinnie Langton M.D.   On: 04/03/2021 07:41  ? ?DG Abd Portable 1V ? ?Result Date: 04/03/2021 ?CLINICAL DATA:  46 year old male with history of abdominal distension. Shortness of breath. EXAM: PORTABLE ABDOMEN - 1 VIEW COMPARISON:  04/02/2021. FINDINGS: Previously noted nasogastric tube has been removed. There continues to be multiple dilated loops of  small bowel in the central abdomen measuring up to 3.9 cm in diameter. There is a small amount of gas and oral contrast material in the colon and rectum. No definite pneumoperitoneum confidently identified on  this single supine view. IMPRESSION: 1. Evidence of persistent partial small bowel obstruction, similar to the prior study, as above. Nasogastric tube has been removed. Electronically Signed   By: Vinnie Langton M.D.   On: 04/03/2021 07:39  ? ?DG Abd Portable 1V ? ?Result Date: 04/02/2021 ?CLINICAL DATA:  Small bowel obstruction EXAM: PORTABLE ABDOMEN - 1 VIEW COMPARISON:  Two days ago FINDINGS: Previously administered oral contrast is seen within colon. A few loops of small bowel remain abnormally gas distended at up to 3.7 cm. Enteric tube with tip and side port at the stomach. IMPRESSION: Partial small bowel obstruction with continued progression of enteric contrast. Electronically Signed   By: Jorje Guild M.D.   On: 04/02/2021 07:58   ? ?Anti-infectives: ?Anti-infectives (From admission, onward)  ? ? None  ? ?  ? ? ?Assessment/Plan: ?SBO ?- Previous abdominal surgery -  PD catheter placement and removal ?- beginning to have some bowel function ?- SBO protocol - contrast in colon ?- Advance diet as tolerated ?- Pancreatitis improving - lipase normal ?  ?ID - none ?VTE - sq heparin ?FEN - IVF, clear liquids ?Foley - none ?  ?ESRD on HD ?Lupus ?Chronic anemia ?HTN ?GERD ?Hx pancreatitis  ? LOS: 3 days  ? ? ?Imogene Burn Deran Barro ?04/03/2021 ? ?

## 2021-04-04 ENCOUNTER — Inpatient Hospital Stay (HOSPITAL_COMMUNITY): Payer: Medicare Other

## 2021-04-04 DIAGNOSIS — K56609 Unspecified intestinal obstruction, unspecified as to partial versus complete obstruction: Secondary | ICD-10-CM | POA: Diagnosis not present

## 2021-04-04 LAB — COMPREHENSIVE METABOLIC PANEL
ALT: 16 U/L (ref 0–44)
AST: 18 U/L (ref 15–41)
Albumin: 2.7 g/dL — ABNORMAL LOW (ref 3.5–5.0)
Alkaline Phosphatase: 78 U/L (ref 38–126)
Anion gap: 12 (ref 5–15)
BUN: 36 mg/dL — ABNORMAL HIGH (ref 6–20)
CO2: 27 mmol/L (ref 22–32)
Calcium: 9.1 mg/dL (ref 8.9–10.3)
Chloride: 96 mmol/L — ABNORMAL LOW (ref 98–111)
Creatinine, Ser: 11.42 mg/dL — ABNORMAL HIGH (ref 0.61–1.24)
GFR, Estimated: 5 mL/min — ABNORMAL LOW (ref >60)
Glucose, Bld: 114 mg/dL — ABNORMAL HIGH (ref 70–99)
Potassium: 4.2 mmol/L (ref 3.5–5.1)
Sodium: 135 mmol/L (ref 135–145)
Total Bilirubin: 0.9 mg/dL (ref 0.3–1.2)
Total Protein: 7.4 g/dL (ref 6.5–8.1)

## 2021-04-04 LAB — CBC WITH DIFFERENTIAL/PLATELET
Abs Immature Granulocytes: 0.02 10*3/uL (ref 0.00–0.07)
Basophils Absolute: 0 10*3/uL (ref 0.0–0.1)
Basophils Relative: 0 %
Eosinophils Absolute: 0 10*3/uL (ref 0.0–0.5)
Eosinophils Relative: 0 %
HCT: 30.3 % — ABNORMAL LOW (ref 39.0–52.0)
Hemoglobin: 9.8 g/dL — ABNORMAL LOW (ref 13.0–17.0)
Immature Granulocytes: 0 %
Lymphocytes Relative: 16 %
Lymphs Abs: 0.8 10*3/uL (ref 0.7–4.0)
MCH: 28.7 pg (ref 26.0–34.0)
MCHC: 32.3 g/dL (ref 30.0–36.0)
MCV: 88.6 fL (ref 80.0–100.0)
Monocytes Absolute: 0.3 10*3/uL (ref 0.1–1.0)
Monocytes Relative: 6 %
Neutro Abs: 4.2 10*3/uL (ref 1.7–7.7)
Neutrophils Relative %: 78 %
Platelets: 180 10*3/uL (ref 150–400)
RBC: 3.42 MIL/uL — ABNORMAL LOW (ref 4.22–5.81)
RDW: 19.1 % — ABNORMAL HIGH (ref 11.5–15.5)
WBC: 5.4 10*3/uL (ref 4.0–10.5)
nRBC: 0 % (ref 0.0–0.2)

## 2021-04-04 LAB — LIPASE, BLOOD: Lipase: 78 U/L — ABNORMAL HIGH (ref 11–51)

## 2021-04-04 LAB — MAGNESIUM: Magnesium: 1.8 mg/dL (ref 1.7–2.4)

## 2021-04-04 MED ORDER — SODIUM CHLORIDE 0.9 % IV SOLN
25.0000 mg | Freq: Three times a day (TID) | INTRAVENOUS | Status: DC | PRN
  Administered 2021-04-04 (×2): 25 mg via INTRAVENOUS
  Filled 2021-04-04 (×5): qty 1

## 2021-04-04 MED ORDER — HYDROMORPHONE HCL 1 MG/ML IJ SOLN
0.5000 mg | Freq: Four times a day (QID) | INTRAMUSCULAR | Status: DC | PRN
  Administered 2021-04-04 – 2021-04-09 (×8): 0.5 mg via INTRAVENOUS
  Filled 2021-04-04 (×9): qty 0.5

## 2021-04-04 NOTE — TOC Initial Note (Signed)
Transition of Care (TOC) - Initial/Assessment Note  ? ? ?Patient Details  ?Name: Nathan Castaneda ?MRN: [id number] ?Date of Birth: February 04, 1975 ? ?Transition of Care (TOC) CM/SW Contact:    ?Cyndi Bender, RN ?Phone Number: ?04/04/2021, 4:25 PM ? ?Clinical Narrative:             ?Spoke to patient regarding not having a PCP. Patient declined for [facility] to find him a PCP. RNCM explained to importance to have a PCP but patient still declined. Patient states he is followed by Nephrologist but doesn't know the MD's name.  ?TOC will continue to follow     ? ? ?Expected Discharge Plan: Home/Self Care ?Barriers to Discharge: Continued Medical Work up ? ? ?Patient Goals and CMS Choice ?Patient states their goals for this hospitalization and ongoing recovery are:: return home ?  ?  ? ?Expected Discharge Plan and Services ?Expected Discharge Plan: Home/Self Care ?In-house Referral: PCP / Health Connect ?  ?  ?  ?                ?  ?  ?  ?  ?  ?  ?  ?  ?  ?  ? ?Prior Living Arrangements/Services ?  ?  ?  ?       ?  ?  ?  ?  ? ?Activities of Daily Living ?Home Assistive Devices/Equipment: None ?ADL Screening (condition at time of admission) ?Patient's cognitive ability adequate to safely complete daily activities?: Yes ?Is the patient deaf or have difficulty hearing?: No ?Does the patient have difficulty seeing, even when wearing glasses/contacts?: No ?Does the patient have difficulty concentrating, remembering, or making decisions?: No ?Patient able to express need for assistance with ADLs?: No ?Does the patient have difficulty dressing or bathing?: No ?Independently performs ADLs?: Yes (appropriate for developmental age) ?Does the patient have difficulty walking or climbing stairs?: No ?Weakness of Legs: None ?Weakness of Arms/Hands: None ? ?Permission Sought/Granted ?  ?  ?   ?   ?   ?   ? ?Emotional Assessment ?  ?  ?  ?  ?  ?  ? ?Admission diagnosis:  SBO (small bowel obstruction) ([place]) [K56.609] ?Patient Active Problem List   ? Diagnosis Date Noted  ? SBO (small bowel obstruction) ([place]) 03/31/2021  ? Elevated lipase 03/31/2021  ? GERD without esophagitis 03/31/2021  ? 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  Class: Chronic  ? 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  Class: Chronic  ? Bilateral hydronephrosis 03/18/2020  ? Left renal mass 03/18/2020  ? Lower urinary tract symptoms (LUTS) 03/18/2020  ? Crohn's disease of colon without complication ([place]) 84/13/2440  ? Peritoneal dialysis catheter in place [facility]) 01/07/2020  ? Pre-transplant evaluation for kidney transplant 01/07/2020  ? SLE (systemic lupus erythematosus) ([place]) 01/07/2020  ? Tobacco abuse 01/07/2020  ? Hypertension   ? Anemia of chronic disease   ? Hyponatremia   ? 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 Acute on chronic anemia 09/02/2019  ? ESRD (end stage renal disease) ([place]) 09/02/2019  ? Hypertensive urgency 09/02/2019  ? Nausea & vomiting 09/02/2019  ? Epistaxis 09/02/2019  ? Headache 04/15/2018  ? CHF (congestive heart failure) ([place]) 01/18/2018  ? ?PCP:  Patient, No Pcp Per (Inactive) ?Pharmacy:   ?[facility], [place] ?[address] ?[place] [address] ?Phone: [phone] Fax: [phone] ? ? ? ? ?Social Determinants of  Health (SDOH) Interventions ?  ? ?Readmission Risk Interventions ?   ? View : No data to display.  ?  ?  ?  ? ? ? ?

## 2021-04-04 NOTE — Progress Notes (Signed)
?[facility] ?Progress Note  ? ?Subjective: Seen in room, was asleep when we arrived. Seems lethargic but denies pain. HD tomorrow on schedule.     ? ?Objective ?Vitals:  ? 04/03/21 2005 04/04/21 6387 04/04/21 0510 04/04/21 0753  ?BP: 123/89 128/86 (!) 160/88 126/85  ?Pulse: 68 66 68 65  ?Resp: 14 16 16 16   ?Temp:  98.3 ?F (36.8 ?C) 97.7 ?F (36.5 ?C) 97.8 ?F (36.6 ?C)  ?TempSrc: Oral Oral Oral Oral  ?SpO2: 97% 99% 97% 97%  ?Weight:      ?Height:      ? ?Physical Exam ?General: Chronically ill appearing male who looks much older than stated age in NAD ?Heart: S1,S2 RRR ?Lungs: CTAB ?Abdomen: NABS, NT ?Extremities:No LE edema ?Dialysis Access: L femoral TDC drsg intact ? ? ? ?Additional Objective ?Labs: ?Basic Metabolic Panel: ?Recent Labs  ?Lab 03/31/21 ?5643 04/01/21 ?0123 04/02/21 ?3295 04/03/21 ?1884 04/04/21 ?0719  ?NA 137 140 134* 133* 135  ?K 5.1 4.3 4.1 3.7 4.2  ?CL 100 103 99 98 96*  ?CO2 26 24 24 27 27   ?GLUCOSE 84 63* 90 84 114*  ?BUN 62* 50* 57* 23* 36*  ?CREATININE 13.06* 11.13* 13.49* 8.17* 11.42*  ?CALCIUM 9.3 9.0 8.6* 8.1* 9.1  ?PHOS 8.0* 6.0*  --   --   --   ? ?Liver Function Tests: ?Recent Labs  ?Lab 04/02/21 ?0227 04/03/21 ?0304 04/04/21 ?0719  ?AST 16 20 18   ?ALT 13 15 16   ?ALKPHOS 61 73 78  ?BILITOT 0.7 0.7 0.9  ?PROT 6.8 6.4* 7.4  ?ALBUMIN 2.6* 2.4* 2.7*  ? ?Recent Labs  ?Lab 04/02/21 ?0227 04/03/21 ?0304 04/04/21 ?0719  ?LIPASE 63* 45 78*  ? ?CBC: ?Recent Labs  ?Lab 03/31/21 ?1660 04/02/21 ?6301 04/03/21 ?0304 04/04/21 ?0719  ?WBC 6.4 5.4 5.1 5.4  ?NEUTROABS  --   --  2.6 4.2  ?HGB 8.6* 8.0* 7.8* 9.8*  ?HCT 25.6* 24.6* 23.6* 30.3*  ?MCV 87.7 88.8 87.1 88.6  ?PLT 151 158 135* 180  ? ?Blood Culture ?No results found for: SDES, [place], [place], REPTSTATUS ? ?Cardiac Enzymes: ?No results for input(s): CKTOTAL, CKMB, CKMBINDEX, TROPONINI in the last 168 hours. ?CBG: ?Recent Labs  ?Lab 04/01/21 ?0746 04/01/21 ?6010  ?GLUCAP 55* 97  ? ?Iron Studies: No results for input(s): IRON,  TIBC, TRANSFERRIN, FERRITIN in the last 72 hours. ?@lablastinr3 @ ?Studies/Results: ?US Abdomen Complete ? ?Result Date: 04/02/2021 ?CLINICAL DATA:  Known history of small-bowel obstruction and abdominal pain, initial encounter EXAM: ABDOMEN ULTRASOUND COMPLETE COMPARISON:  CT from 03/30/2021 FINDINGS: Gallbladder: No gallstones or wall thickening visualized. No sonographic Murphy sign noted by sonographer. Common bile duct: Diameter: 7.8 mm Liver: No focal lesion identified. Within normal limits in parenchymal echogenicity. Portal vein is patent on color Doppler imaging with normal direction of blood flow towards the liver. IVC: No abnormality visualized. Pancreas: Visualized. Spleen: Size and appearance within normal limits. Right Kidney: Length: 8.9 cm. Mild increased echogenicity is noted. Upper pole cyst is noted similar to that seen on prior CT examination measuring 3 cm. Smaller cysts seen on prior CT are not well appreciated on this exam. Left Kidney: Length: 10.4 cm. Mild increased echogenicity is noted. Multiple cysts are noted the largest of which measures 1.9 cm similar to that seen on prior CT examination. Abdominal aorta: No aneurysm visualized. Other findings: Mild ascites is noted. IMPRESSION: Mild ascites. Bilateral renal cystic changes stable from previous CT. Nonvisualization of the pancreas. Electronically Signed   By: Linus Mako.D.  On: 04/02/2021 20:57  ? ?DG Chest Port 1 View ? ?Result Date: 04/03/2021 ?CLINICAL DATA:  46 year old male with history of abdominal distension and shortness of breath. EXAM: PORTABLE CHEST 1 VIEW COMPARISON:  Chest x-ray 03/30/2021. FINDINGS: The tip of a PermCath is noted in the right atrium, presumably from femoral approach. Left subclavian HERO graft tip terminates at the superior cavoatrial junction. Lung volumes are normal. No consolidative airspace disease. No pleural effusions. No pneumothorax. No pulmonary nodule or mass noted. Pulmonary vasculature and  the cardiomediastinal silhouette are within normal limits. Atherosclerosis in the thoracic aorta. IMPRESSION: 1. Support apparatus, as above. 2. No radiographic evidence of acute cardiopulmonary disease. 3. Aortic atherosclerosis. Electronically Signed   By: Vinnie Langton M.D.   On: 04/03/2021 07:41  ? ?DG Abd Portable 1V ? ?Result Date: 04/04/2021 ?CLINICAL DATA:  Encounter for nausea and vomiting EXAM: PORTABLE ABDOMEN - 1 VIEW COMPARISON:  KUB from yesterday FINDINGS: Continued gas distended small bowel loops in the left abdomen. Enteric contrast is seen in nondilated colon. Dialysis catheter traversing the IVC, tip not visualized. IMPRESSION: No progression of mild small bowel dilatation. Electronically Signed   By: Jorje Guild M.D.   On: 04/04/2021 06:47  ? ?DG Abd Portable 1V ? ?Result Date: 04/03/2021 ?CLINICAL DATA:  46 year old male with history of abdominal distension. Shortness of breath. EXAM: PORTABLE ABDOMEN - 1 VIEW COMPARISON:  04/02/2021. FINDINGS: Previously noted nasogastric tube has been removed. There continues to be multiple dilated loops of small bowel in the central abdomen measuring up to 3.9 cm in diameter. There is a small amount of gas and oral contrast material in the colon and rectum. No definite pneumoperitoneum confidently identified on this single supine view. IMPRESSION: 1. Evidence of persistent partial small bowel obstruction, similar to the prior study, as above. Nasogastric tube has been removed. Electronically Signed   By: Vinnie Langton M.D.   On: 04/03/2021 07:39   ?Medications: ? promethazine (PHENERGAN) injection (IM or IVPB)    ? ? chlorhexidine  15 mL Mouth Rinse BID  ? Chlorhexidine Gluconate Cloth  6 each Topical Q0600  ? darbepoetin (ARANESP) injection - NON-DIALYSIS  200 mcg Subcutaneous Q Thu-1800  ? doxercalciferol  1 mcg Intravenous Q T,Th,Sa-HD  ? heparin  5,000 Units Subcutaneous Q8H  ? mouth rinse  15 mL Mouth Rinse q12n4p  ? pantoprazole  40 mg Oral  Daily  ? ? ? ?Dialysis Orders: ?TTS [facility] ? 4h  400/600 67.5kg  2/2 bath  R fem TDC   ?-Heparin 2900 units IV TIW ? - hectorol 1 ug tiw ?  ?Assessment/Plan: ?1. Small Bowel Obstruction: Likely from adhesions from previous CAPD v. pancreatitis ileus picture. NG tube removed, but nauseated this AM. Per primary/CCS.  ?2. H/O Pancreatitis-Lipase trending down (678 -> 45 today). IG4 pending. per primary ?3. ESRD: HD TTS - next 3/28. ?4. HTN/volume: BP controlled, no edema. ?4. Anemia of Chronic Kidney disease: Hgb 9.8 04/04/2021- due for ESA next on 3/31 if still here.  ?5. Secondary hyperparathyroidism: Ca ok, Phos a little high - binders on hold until cleared for solid diet. ?6. Nutrition: Diet being advanced by surgery team. ? ?Jimmye Norman. [facility] Kohan NP-C ?04/04/2021, 11:19 AM  ?[facility] ?[phone] ? ? ?  ? ?

## 2021-04-04 NOTE — Progress Notes (Signed)
?                                  PROGRESS NOTE                                             ?                                                                                                                     ?                                         ? ? Patient Demographics:  ? ? Nathan Castaneda, is a 46 y.o. male, DOB - [birth year], SAY:[id number] ? ?Outpatient Primary MD for the patient is Patient, No Pcp Per (Inactive)    LOS - 4  Admit date - 03/31/2021   ? ?No chief complaint on file. ?    ? ?Brief Narrative (HPI from H&P)  - 46 y.o. African-American male with medical history significant for essential hypertension, lupus, end-stage renal disease on hemodialysis on TTS, previously on peritoneal dialysis, pancreatitis, who presented [facility] the ER at [facility] with acute onset diffuse abdominal pain with associated nausea and vomiting for a day, found to have SBO. ? ? Subjective:  ? ?Patient in bed, appears comfortable, denies any headache, no fever, no chest pain or pressure, no shortness of breath , no abdominal pain but ++ nausea. No new focal weakness. ? ? Assessment  & Plan :  ? ? ?Small bowel obstruction most likely partial in the setting of acute pancreatitis.  Being followed by general surgery pancreatitis has improved, is having some bowel activity, NG tube was removed by general surgery on 04/02/2021.  Was gradually advanced to regular by general surgery on 04/03/2021 but had emesis overnight again and now nauseated, turned down diet to clear liquids, supportive care will defer to general surgery on management of this problem. ? ?2. ESRD - Lupus -on HD, nephrology following. TTS,  Continue to monitor. ? ?3. H/O Pancreatitis - remote history of alcohol abuse but none in the last year, LFTs stable, right upper quadrant ultrasound shows stable CBD, stable triglycerides, IgG4 levels pending.  Lipase levels trending down, if better outpatient GI follow-up. ? ?4. GERD - PPI. ? ?    ? ?Condition - Fair ? ?Family Communication  :  None present ? ?Code Status :  Full ? ?Consults  :  CCS, Renal ? ?PUD Prophylaxis :  PPI ? ? Procedures  :    ? ? ? ?   ? ?Disposition Plan  :   ? ?Status is: Inpatient ? ? ?DVT Prophylaxis  :   ? ?heparin injection 5,000 Units  Start: 03/31/21 0600 ? ?Lab Results  ?Component Value Date  ? PLT 180 04/04/2021  ? ? ?Diet :  ?Diet Order   ? ?       ?  Diet clear liquid Room service appropriate? Yes; Fluid consistency: Thin  Diet effective now       ?  ? ?  ?  ? ?  ?  ? ?Inpatient Medications ? ?Scheduled Meds: ? chlorhexidine  15 mL Mouth Rinse BID  ? Chlorhexidine Gluconate Cloth  6 each Topical Q0600  ? darbepoetin (ARANESP) injection - NON-DIALYSIS  200 mcg Subcutaneous Q Thu-1800  ? doxercalciferol  1 mcg Intravenous Q T,Th,Sa-HD  ? heparin  5,000 Units Subcutaneous Q8H  ? mouth rinse  15 mL Mouth Rinse q12n4p  ? pantoprazole  40 mg Oral Daily  ? ?Continuous Infusions: ? promethazine (PHENERGAN) injection (IM or IVPB)    ? ?PRN Meds:.acetaminophen **OR** acetaminophen, HYDROmorphone (DILAUDID) injection, ondansetron **OR** ondansetron (ZOFRAN) IV, promethazine (PHENERGAN) injection (IM or IVPB), traZODone ? ?Antibiotics  :   ? ?Anti-infectives (From admission, onward)  ? ? None  ? ?  ? ? ? Time Spent in minutes  30 ? ? ?Lala Lund M.D on 04/04/2021 at 9:27 AM ? ?To page go to [email or web address]  ? ?[facility] -  Office  [phone] ? ?See all Orders from today for further details ? ? ? Objective:  ? ?Vitals:  ? 04/03/21 2005 04/04/21 3790 04/04/21 0510 04/04/21 0753  ?BP: 123/89 128/86 (!) 160/88 126/85  ?Pulse: 68 66 68 65  ?Resp: 14 16 16 16   ?Temp:  98.3 ?F (36.8 ?C) 97.7 ?F (36.5 ?C) 97.8 ?F (36.6 ?C)  ?TempSrc: Oral Oral Oral Oral  ?SpO2: 97% 99% 97% 97%  ?Weight:      ?Height:      ? ? ?Wt Readings from Last 3 Encounters:  ?04/02/21 68.5 kg  ?12/15/20 76 kg  ?10/20/19 75.3 kg  ? ? ? ?Intake/Output Summary (Last 24 hours) at 04/04/2021 2409 ?Last data  filed at 04/03/2021 2000 ?Gross per 24 hour  ?Intake 240 ml  ?Output --  ?Net 240 ml  ? ? ? ?Physical Exam ? ?Awake Alert, No new F.N deficits, Normal affect ?[place].AT,PERRAL ?Supple Neck, No JVD,   ?Symmetrical Chest wall movement, Good air movement bilaterally, CTAB ?RRR,No Gallops, Rubs or new Murmurs,  ?mild epigastric tenderness,   ?No Cyanosis, Clubbing or edema  ? ? ? Data Review:  ? ? ?CBC ?Recent Labs  ?Lab 03/31/21 ?7353 04/02/21 ?2992 04/03/21 ?0304 04/04/21 ?0719  ?WBC 6.4 5.4 5.1 5.4  ?HGB 8.6* 8.0* 7.8* 9.8*  ?HCT 25.6* 24.6* 23.6* 30.3*  ?PLT 151 158 135* 180  ?MCV 87.7 88.8 87.1 88.6  ?MCH 29.5 28.9 28.8 28.7  ?MCHC 33.6 32.5 33.1 32.3  ?RDW 18.6* 18.8* 18.1* 19.1*  ?LYMPHSABS  --   --  2.0 0.8  ?MONOABS  --   --  0.4 0.3  ?EOSABS  --   --  0.1 0.0  ?BASOSABS  --   --  0.0 0.0  ? ? ?Electrolytes ?Recent Labs  ?Lab 03/31/21 ?4268 04/01/21 ?0123 04/02/21 ?3419 04/03/21 ?6222 04/04/21 ?0719  ?NA 137 140 134* 133* 135  ?K 5.1 4.3 4.1 3.7 4.2  ?CL 100 103 99 98 96*  ?CO2 26 24 24 27 27   ?GLUCOSE 84 63* 90 84 114*  ?BUN 62* 50* 57* 23* 36*  ?CREATININE 13.06* 11.13* 13.49* 8.17* 11.42*  ?CALCIUM 9.3 9.0 8.6* 8.1* 9.1  ?AST  --   --  16 20 18   ?ALT  --   --  13 15 16   ?ALKPHOS  --   --  61 73 78  ?BILITOT  --   --  0.7 0.7 0.9  ?ALBUMIN  --  2.8* 2.6* 2.4* 2.7*  ?MG  --  2.1  --  1.7 1.8  ? ? ? ? ?Radiology Reports ?US Abdomen Complete ? ?Result Date: 04/02/2021 ?CLINICAL DATA:  Known history of small-bowel obstruction and abdominal pain, initial encounter EXAM: ABDOMEN ULTRASOUND COMPLETE COMPARISON:  CT from 03/30/2021 FINDINGS: Gallbladder: No gallstones or wall thickening visualized. No sonographic Murphy sign noted by sonographer. Common bile duct: Diameter: 7.8 mm Liver: No focal lesion identified. Within normal limits in parenchymal echogenicity. Portal vein is patent on color Doppler imaging with normal direction of blood flow towards the liver. IVC: No abnormality visualized. Pancreas: Visualized.  Spleen: Size and appearance within normal limits. Right Kidney: Length: 8.9 cm. Mild increased echogenicity is noted. Upper pole cyst is noted similar to that seen on prior CT examination measuring 3 cm. Smaller cysts seen on prior CT are not well appreciated on this exam. Left Kidney: Length: 10.4 cm. Mild increased echogenicity is noted. Multiple cysts are noted the largest of which measures 1.9 cm similar to that seen on prior CT examination. Abdominal aorta: No aneurysm visualized. Other findings: Mild ascites is noted. IMPRESSION: Mild ascites. Bilateral renal cystic changes stable from previous CT. Nonvisualization of the pancreas. Electronically Signed   By: Inez Catalina M.D.   On: 04/02/2021 20:57  ? ?DG Chest Port 1 View ? ?Result Date: 04/03/2021 ?CLINICAL DATA:  46 year old male with history of abdominal distension and shortness of breath. EXAM: PORTABLE CHEST 1 VIEW COMPARISON:  Chest x-ray 03/30/2021. FINDINGS: The tip of a PermCath is noted in the right atrium, presumably from femoral approach. Left subclavian HERO graft tip terminates at the superior cavoatrial junction. Lung volumes are normal. No consolidative airspace disease. No pleural effusions. No pneumothorax. No pulmonary nodule or mass noted. Pulmonary vasculature and the cardiomediastinal silhouette are within normal limits. Atherosclerosis in the thoracic aorta. IMPRESSION: 1. Support apparatus, as above. 2. No radiographic evidence of acute cardiopulmonary disease. 3. Aortic atherosclerosis. Electronically Signed   By: Vinnie Langton M.D.   On: 04/03/2021 07:41  ? ?DG Abd Portable 1V ? ?Result Date: 04/04/2021 ?CLINICAL DATA:  Encounter for nausea and vomiting EXAM: PORTABLE ABDOMEN - 1 VIEW COMPARISON:  KUB from yesterday FINDINGS: Continued gas distended small bowel loops in the left abdomen. Enteric contrast is seen in nondilated colon. Dialysis catheter traversing the IVC, tip not visualized. IMPRESSION: No progression of mild small  bowel dilatation. Electronically Signed   By: Jorje Guild M.D.   On: 04/04/2021 06:47  ? ?DG Abd Portable 1V ? ?Result Date: 04/03/2021 ?CLINICAL DATA:  46 year old male with history of abdominal distension

## 2021-04-04 NOTE — Care Management Important Message (Signed)
Important Message ? ?Patient Details  ?Name: Nathan Castaneda ?MRN: [id number] ?Date of Birth: [birth year] ? ? ?Medicare Important Message Given:  Yes ? ? ? ? ?Yanely Mast ?04/04/2021, 2:47 PM ?

## 2021-04-04 NOTE — Progress Notes (Addendum)
? ?Subjective/Chief Complaint: ?Vomited once overnight.  With hiccups this morning.  Just got pain meds so is very sleepy and not able to talk to me much.  Pain in LUQ.  Had a BM yesterday.   ? ? ?Objective: ?Vital signs in last 24 hours: ?Temp:  [97.7 ?F (36.5 ?C)-98.3 ?F (36.8 ?C)] 97.8 ?F (36.6 ?C) (03/27 0753) ?Pulse Rate:  [62-98] 65 (03/27 0753) ?Resp:  [14-16] 16 (03/27 0753) ?BP: (102-160)/(62-89) 126/85 (03/27 0753) ?SpO2:  [97 %-99 %] 97 % (03/27 0753) ?Last BM Date : 04/03/21 ? ?Intake/Output from previous day: ?03/26 0701 - 03/27 0700 ?In: 240 [P.O.:240] ?Out: -  ?Intake/Output this shift: ?No intake/output data recorded. ? ?PE: ?Gen: sedated ?Abd - less distended, minimal tenderness on left side of abdomen, more upper abdomen.  +BS ? ?Lab Results:  ?Recent Labs  ?  04/03/21 ?0304 04/04/21 ?0719  ?WBC 5.1 5.4  ?HGB 7.8* 9.8*  ?HCT 23.6* 30.3*  ?PLT 135* 180  ? ?BMET ?Recent Labs  ?  04/03/21 ?0304 04/04/21 ?0719  ?NA 133* 135  ?K 3.7 4.2  ?CL 98 96*  ?CO2 27 27  ?GLUCOSE 84 114*  ?BUN 23* 36*  ?CREATININE 8.17* 11.42*  ?CALCIUM 8.1* 9.1  ? ?PT/INR ?No results for input(s): LABPROT, INR in the last 72 hours. ?ABG ?No results for input(s): PHART, HCO3 in the last 72 hours. ? ?Invalid input(s): PCO2, PO2 ? ?Studies/Results: ?US Abdomen Complete ? ?Result Date: 04/02/2021 ?CLINICAL DATA:  Known history of small-bowel obstruction and abdominal pain, initial encounter EXAM: ABDOMEN ULTRASOUND COMPLETE COMPARISON:  CT from 03/30/2021 FINDINGS: Gallbladder: No gallstones or wall thickening visualized. No sonographic Murphy sign noted by sonographer. Common bile duct: Diameter: 7.8 mm Liver: No focal lesion identified. Within normal limits in parenchymal echogenicity. Portal vein is patent on color Doppler imaging with normal direction of blood flow towards the liver. IVC: No abnormality visualized. Pancreas: Visualized. Spleen: Size and appearance within normal limits. Right Kidney: Length: 8.9 cm. Mild  increased echogenicity is noted. Upper pole cyst is noted similar to that seen on prior CT examination measuring 3 cm. Smaller cysts seen on prior CT are not well appreciated on this exam. Left Kidney: Length: 10.4 cm. Mild increased echogenicity is noted. Multiple cysts are noted the largest of which measures 1.9 cm similar to that seen on prior CT examination. Abdominal aorta: No aneurysm visualized. Other findings: Mild ascites is noted. IMPRESSION: Mild ascites. Bilateral renal cystic changes stable from previous CT. Nonvisualization of the pancreas. Electronically Signed   By: Inez Catalina M.D.   On: 04/02/2021 20:57  ? ?DG Chest Port 1 View ? ?Result Date: 04/03/2021 ?CLINICAL DATA:  46 year old male with history of abdominal distension and shortness of breath. EXAM: PORTABLE CHEST 1 VIEW COMPARISON:  Chest x-ray 03/30/2021. FINDINGS: The tip of a PermCath is noted in the right atrium, presumably from femoral approach. Left subclavian HERO graft tip terminates at the superior cavoatrial junction. Lung volumes are normal. No consolidative airspace disease. No pleural effusions. No pneumothorax. No pulmonary nodule or mass noted. Pulmonary vasculature and the cardiomediastinal silhouette are within normal limits. Atherosclerosis in the thoracic aorta. IMPRESSION: 1. Support apparatus, as above. 2. No radiographic evidence of acute cardiopulmonary disease. 3. Aortic atherosclerosis. Electronically Signed   By: Vinnie Langton M.D.   On: 04/03/2021 07:41  ? ?DG Abd Portable 1V ? ?Result Date: 04/04/2021 ?CLINICAL DATA:  Encounter for nausea and vomiting EXAM: PORTABLE ABDOMEN - 1 VIEW COMPARISON:  KUB from yesterday  FINDINGS: Continued gas distended small bowel loops in the left abdomen. Enteric contrast is seen in nondilated colon. Dialysis catheter traversing the IVC, tip not visualized. IMPRESSION: No progression of mild small bowel dilatation. Electronically Signed   By: Jorje Guild M.D.   On: 04/04/2021  06:47  ? ?DG Abd Portable 1V ? ?Result Date: 04/03/2021 ?CLINICAL DATA:  46 year old male with history of abdominal distension. Shortness of breath. EXAM: PORTABLE ABDOMEN - 1 VIEW COMPARISON:  04/02/2021. FINDINGS: Previously noted nasogastric tube has been removed. There continues to be multiple dilated loops of small bowel in the central abdomen measuring up to 3.9 cm in diameter. There is a small amount of gas and oral contrast material in the colon and rectum. No definite pneumoperitoneum confidently identified on this single supine view. IMPRESSION: 1. Evidence of persistent partial small bowel obstruction, similar to the prior study, as above. Nasogastric tube has been removed. Electronically Signed   By: Vinnie Langton M.D.   On: 04/03/2021 07:39   ? ?Anti-infectives: ?Anti-infectives (From admission, onward)  ? ? None  ? ?  ? ? ?Assessment/Plan: ?SBO in setting of pancreatitis ?- Previous abdominal surgery -  PD catheter placement and removal ?- having bowel function with a BM yesterday, but with vomiting overnight.   ?-lipase has gone back up a small amount today, but given his pancreatitis and abdominal films that really don't show a bowel obstruction, I suspect his pain and vomiting are from his pancreatitis. ?-diet as tolerates in relation to his pancreatitis ?- SBO protocol - contrast in colon ?  ?ID - none ?VTE - sq heparin ?FEN - IVF, NPO ?Foley - none ?  ?ESRD on HD ?Lupus ?Chronic anemia ?HTN ?GERD ?Hx pancreatitis  ? ?I have reviewed hospitalist note, vitals for last 24 hrs, I/Os for last 24 hrs, along with labs and imaging. ? LOS: 4 days  ? ? ?Henreitta Cea ?04/04/2021 ? ?

## 2021-04-05 ENCOUNTER — Inpatient Hospital Stay (HOSPITAL_COMMUNITY): Payer: Medicare Other

## 2021-04-05 DIAGNOSIS — K56609 Unspecified intestinal obstruction, unspecified as to partial versus complete obstruction: Secondary | ICD-10-CM | POA: Diagnosis not present

## 2021-04-05 LAB — MAGNESIUM: Magnesium: 1.7 mg/dL (ref 1.7–2.4)

## 2021-04-05 LAB — COMPREHENSIVE METABOLIC PANEL
ALT: 11 U/L (ref 0–44)
AST: 14 U/L — ABNORMAL LOW (ref 15–41)
Albumin: 2.6 g/dL — ABNORMAL LOW (ref 3.5–5.0)
Alkaline Phosphatase: 67 U/L (ref 38–126)
Anion gap: 17 — ABNORMAL HIGH (ref 5–15)
BUN: 49 mg/dL — ABNORMAL HIGH (ref 6–20)
CO2: 22 mmol/L (ref 22–32)
Calcium: 8.6 mg/dL — ABNORMAL LOW (ref 8.9–10.3)
Chloride: 96 mmol/L — ABNORMAL LOW (ref 98–111)
Creatinine, Ser: 13.23 mg/dL — ABNORMAL HIGH (ref 0.61–1.24)
GFR, Estimated: 4 mL/min — ABNORMAL LOW (ref >60)
Glucose, Bld: 82 mg/dL (ref 70–99)
Potassium: 5.5 mmol/L — ABNORMAL HIGH (ref 3.5–5.1)
Sodium: 135 mmol/L (ref 135–145)
Total Bilirubin: 1.1 mg/dL (ref 0.3–1.2)
Total Protein: 7.1 g/dL (ref 6.5–8.1)

## 2021-04-05 LAB — CBC WITH DIFFERENTIAL/PLATELET
Abs Immature Granulocytes: 0.02 10*3/uL (ref 0.00–0.07)
Basophils Absolute: 0 10*3/uL (ref 0.0–0.1)
Basophils Relative: 0 %
Eosinophils Absolute: 0 10*3/uL (ref 0.0–0.5)
Eosinophils Relative: 1 %
HCT: 27.2 % — ABNORMAL LOW (ref 39.0–52.0)
Hemoglobin: 9.1 g/dL — ABNORMAL LOW (ref 13.0–17.0)
Immature Granulocytes: 0 %
Lymphocytes Relative: 38 %
Lymphs Abs: 1.8 10*3/uL (ref 0.7–4.0)
MCH: 28.4 pg (ref 26.0–34.0)
MCHC: 33.5 g/dL (ref 30.0–36.0)
MCV: 85 fL (ref 80.0–100.0)
Monocytes Absolute: 0.5 10*3/uL (ref 0.1–1.0)
Monocytes Relative: 10 %
Neutro Abs: 2.4 10*3/uL (ref 1.7–7.7)
Neutrophils Relative %: 51 %
Platelets: 186 10*3/uL (ref 150–400)
RBC: 3.2 MIL/uL — ABNORMAL LOW (ref 4.22–5.81)
RDW: 18.1 % — ABNORMAL HIGH (ref 11.5–15.5)
WBC: 4.8 10*3/uL (ref 4.0–10.5)
nRBC: 0 % (ref 0.0–0.2)

## 2021-04-05 LAB — LIPASE, BLOOD: Lipase: 35 U/L (ref 11–51)

## 2021-04-05 MED ORDER — ALTEPLASE 2 MG IJ SOLR: 2.0000 mg | Freq: Once | INTRAMUSCULAR | Status: DC | PRN

## 2021-04-05 MED ORDER — SODIUM CHLORIDE 0.9 % IV SOLN: 100.0000 mL | INTRAVENOUS | Status: DC | PRN

## 2021-04-05 MED ORDER — HEPARIN SODIUM (PORCINE) 1000 UNIT/ML DIALYSIS
2900.0000 [IU] | Freq: Once | INTRAMUSCULAR | Status: AC
  Administered 2021-04-05: 2900 [IU] via INTRAVENOUS_CENTRAL
  Filled 2021-04-05: qty 3

## 2021-04-05 MED ORDER — LIDOCAINE-PRILOCAINE 2.5-2.5 % EX CREA: 1.0000 "application " | TOPICAL_CREAM | CUTANEOUS | Status: DC | PRN

## 2021-04-05 MED ORDER — HEPARIN SODIUM (PORCINE) 1000 UNIT/ML DIALYSIS
1000.0000 [IU] | INTRAMUSCULAR | Status: DC | PRN
  Administered 2021-04-05: 1000 [IU] via INTRAVENOUS_CENTRAL
  Filled 2021-04-05: qty 1

## 2021-04-05 MED ORDER — PENTAFLUOROPROP-TETRAFLUOROETH EX AERO: 1.0000 "application " | INHALATION_SPRAY | CUTANEOUS | Status: DC | PRN

## 2021-04-05 MED ORDER — LIDOCAINE HCL (PF) 1 % IJ SOLN: 5.0000 mL | INTRAMUSCULAR | Status: DC | PRN

## 2021-04-05 NOTE — Consult Note (Signed)
Reason for Consult: Nausea, vomiting, SBO, pancreatitis ?Referring Physician: [facility] ? ?Nathan Castaneda ?HPI: This is a 46 year old male with a PMH of ESRD on dialysis, HTN, and Lupus admitted with an SBO.  He has a history of peritoneal dialysis catheters and there was the suspicion that his SBO was from adhesions.  Conservative treatment with an NG tube was performed and this helped his symptoms.  Imaging at the time of admission for his abdominal pain, nausea, and vomiting showed that he had multiple loops of small bowel in the central abdomen concerning for an SBO.  Over the course of his admission he started to improve, but yesterday he experienced nausea and vomiting.  He reports that his hiccups precipitate his nausea.  His vomiting abated a couple of days ago, but he feels as if he is going to vomit when the hiccups induce nausea.  The hiccups frequent throughout the day and it can be brought about by PO intake.  During this hospitalization he was started on pantoprazole.  He denies any prior history of GERD.  At the time of admission his lipase was at 678.  His values did drop down significantly he following days.  With this finding of a pancreatitis there was concern for a possible small bowel ileus. ? ?Past Medical History:  ?Diagnosis Date  ? Anemia   ? Anxiety   ? History of recent blood transfusion   ? Hypertension   ? Lupus ([place])   ? Renal disease   ? ? ?Past Surgical History:  ?Procedure Laterality Date  ? AV FISTULA PLACEMENT    ? PERITONEAL CATHETER INSERTION    ? REVISON OF ARTERIOVENOUS FISTULA Right 09/12/2019  ? Procedure: 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 artery with reversed saphenous vein from left ankle;  Surgeon: Rosetta Posner, MD;  Location: [facility] OR;  Service: Vascular;  Laterality: Right;  ? VEIN HARVEST Left 09/12/2019  ? Procedure: SAPHENOUS VEIN HARVEST;  Surgeon: Rosetta Posner, MD;  Location: [place];  Service: Vascular;  Laterality: Left;  ? WOUND  DEBRIDEMENT Right 09/26/2019  ? Procedure: RIGHT UPPER EXTREMITY WOUND WASHOUT;  Surgeon: Rosetta Posner, MD;  Location: [place];  Service: Vascular;  Laterality: Right;  ? ? ?History reviewed. No pertinent family history. ? ?Social History:  reports that he has been smoking cigarettes. He has a 7.50 pack-year smoking history. He has never used smokeless tobacco. He reports that he does not drink alcohol and does not use drugs. ? ?Allergies:  ?Allergies  ?Allergen Reactions  ? Vancomycin Itching  ? ? ?Medications: Scheduled: ? chlorhexidine  15 mL Mouth Rinse BID  ? Chlorhexidine Gluconate Cloth  6 each Topical Q0600  ? darbepoetin (ARANESP) injection - NON-DIALYSIS  200 mcg Subcutaneous Q Thu-1800  ? doxercalciferol  1 mcg Intravenous Q T,Th,Sa-HD  ? heparin  5,000 Units Subcutaneous Q8H  ? mouth rinse  15 mL Mouth Rinse q12n4p  ? pantoprazole  40 mg Oral Daily  ? ?Continuous: ? sodium chloride    ? sodium chloride    ? promethazine (PHENERGAN) injection (IM or IVPB) 25 mg (04/04/21 2324)  ? ? ?Results for orders placed or performed during the hospital encounter of 03/31/21 (from the past 24 hour(s))  ?CBC with Differential/Platelet     Status: Abnormal  ? Collection Time: 04/05/21  2:40 AM  ?Result Value Ref Range  ? WBC 4.8 4.0 - 10.5 K/uL  ? RBC 3.20 (L) 4.22 - 5.81  MIL/uL  ? Hemoglobin 9.1 (L) 13.0 - 17.0 g/dL  ? HCT 27.2 (L) 39.0 - 52.0 %  ? MCV 85.0 80.0 - 100.0 fL  ? MCH 28.4 26.0 - 34.0 pg  ? MCHC 33.5 30.0 - 36.0 g/dL  ? RDW 18.1 (H) 11.5 - 15.5 %  ? Platelets 186 150 - 400 K/uL  ? nRBC 0.0 0.0 - 0.2 %  ? Neutrophils Relative % 51 %  ? Neutro Abs 2.4 1.7 - 7.7 K/uL  ? Lymphocytes Relative 38 %  ? Lymphs Abs 1.8 0.7 - 4.0 K/uL  ? Monocytes Relative 10 %  ? Monocytes Absolute 0.5 0.1 - 1.0 K/uL  ? Eosinophils Relative 1 %  ? Eosinophils Absolute 0.0 0.0 - 0.5 K/uL  ? Basophils Relative 0 %  ? Basophils Absolute 0.0 0.0 - 0.1 K/uL  ? Immature Granulocytes 0 %  ? Abs Immature Granulocytes 0.02 0.00 - 0.07 K/uL   ?Magnesium     Status: None  ? Collection Time: 04/05/21  2:40 AM  ?Result Value Ref Range  ? Magnesium 1.7 1.7 - 2.4 mg/dL  ?Comprehensive metabolic panel     Status: Abnormal  ? Collection Time: 04/05/21  2:40 AM  ?Result Value Ref Range  ? Sodium 135 135 - 145 mmol/L  ? Potassium 5.5 (H) 3.5 - 5.1 mmol/L  ? Chloride 96 (L) 98 - 111 mmol/L  ? CO2 22 22 - 32 mmol/L  ? Glucose, Bld 82 70 - 99 mg/dL  ? BUN 49 (H) 6 - 20 mg/dL  ? Creatinine, Ser 13.23 (H) 0.61 - 1.24 mg/dL  ? Calcium 8.6 (L) 8.9 - 10.3 mg/dL  ? Total Protein 7.1 6.5 - 8.1 g/dL  ? Albumin 2.6 (L) 3.5 - 5.0 g/dL  ? AST 14 (L) 15 - 41 U/L  ? ALT 11 0 - 44 U/L  ? Alkaline Phosphatase 67 38 - 126 U/L  ? Total Bilirubin 1.1 0.3 - 1.2 mg/dL  ? GFR, Estimated 4 (L) >60 mL/min  ? Anion gap 17 (H) 5 - 15  ?Lipase, blood     Status: None  ? Collection Time: 04/05/21  2:40 AM  ?Result Value Ref Range  ? Lipase 35 11 - 51 U/L  ?  ? ?DG Abd Portable 1V ? ?Result Date: 04/04/2021 ?CLINICAL DATA:  Encounter for nausea and vomiting EXAM: PORTABLE ABDOMEN - 1 VIEW COMPARISON:  KUB from yesterday FINDINGS: Continued gas distended small bowel loops in the left abdomen. Enteric contrast is seen in nondilated colon. Dialysis catheter traversing the IVC, tip not visualized. IMPRESSION: No progression of mild small bowel dilatation. Electronically Signed   By: Jorje Guild M.D.   On: 04/04/2021 06:47   ? ?ROS:  As stated above in the HPI otherwise negative. ? ?Blood pressure 109/60, pulse 86, temperature 98.4 ?F (36.9 ?C), temperature source Oral, resp. rate 15, height 5' 10"  (1.778 m), weight 66.2 kg, SpO2 100 %.   ? ?PE: ?Gen: NAD, Alert and Oriented ?HEENT:  [place]/AT, EOMI ?Neck: Supple, no LAD ?Lungs: CTA Bilaterally ?CV: RRR without M/G/R ?ABD: Soft, tender in the epigastrium, +BS ?Ext: No C/C/E ? ?Assessment/Plan: ?1) Hiccups. ?2) Nausea. ?3) SBO - resolved. ?4) ? Pancreatitis. ? ? The patient did have a marked elevation in his lipase at the time of admission, but  there was no reported evidence of pancreatitis with imaging.  He was diagnosed with an acute pancreatitis in 12/2020 when he was treated at [facility] along with other medical  issues.  His lipase levels have essentially normalized.  Right now it appears that his hiccups are driving his nausea problems.  He will maintain pantoprazole as an esophagitis can induce singultus.  Baclofen 10 mg TID will be tried.  Gabapentin can also be used.  Another treatment that can be tried is SL NTG.  This can help to arrest the smooth muscle contractions. ? ?Plan: ?1) Baclofen 10 mg TID. ?2) Try a teaspoon of granulated sugar on the tongue, as a nonpharmacologic treatment. ? ? ?Fahmida Jurich D ?04/05/2021, 12:26 PM  ? ? ?  ?

## 2021-04-05 NOTE — Progress Notes (Signed)
? ?  Subjective/Chief Complaint: ?Still with some nausea and abdominal pain.  In HD.  Had a BM yesterday. ? ? ?Objective: ?Vital signs in last 24 hours: ?Temp:  [97.9 ?F (36.6 ?C)-98.6 ?F (37 ?C)] 98.6 ?F (37 ?C) (03/28 8110) ?Pulse Rate:  [70-107] 86 (03/28 1100) ?Resp:  [15-20] 19 (03/28 1000) ?BP: (83-135)/(36-91) 101/54 (03/28 1100) ?SpO2:  [94 %-97 %] 95 % (03/28 0533) ?Last BM Date : 04/03/21 ? ?Intake/Output from previous day: ?03/27 0701 - 03/28 0700 ?In: 73 [IV Piggyback:50] ?Out: -  ?Intake/Output this shift: ?No intake/output data recorded. ? ?PE: ?Gen: NAD ?Abd - softer, ND minimal tenderness in lower abdomen.  +BS ? ?Lab Results:  ?Recent Labs  ?  04/04/21 ?0719 04/05/21 ?0240  ?WBC 5.4 4.8  ?HGB 9.8* 9.1*  ?HCT 30.3* 27.2*  ?PLT 180 186  ? ?BMET ?Recent Labs  ?  04/04/21 ?0719 04/05/21 ?0240  ?NA 135 135  ?K 4.2 5.5*  ?CL 96* 96*  ?CO2 27 22  ?GLUCOSE 114* 82  ?BUN 36* 49*  ?CREATININE 11.42* 13.23*  ?CALCIUM 9.1 8.6*  ? ?PT/INR ?No results for input(s): LABPROT, INR in the last 72 hours. ?ABG ?No results for input(s): PHART, HCO3 in the last 72 hours. ? ?Invalid input(s): PCO2, PO2 ? ?Studies/Results: ?DG Abd Portable 1V ? ?Result Date: 04/04/2021 ?CLINICAL DATA:  Encounter for nausea and vomiting EXAM: PORTABLE ABDOMEN - 1 VIEW COMPARISON:  KUB from yesterday FINDINGS: Continued gas distended small bowel loops in the left abdomen. Enteric contrast is seen in nondilated colon. Dialysis catheter traversing the IVC, tip not visualized. IMPRESSION: No progression of mild small bowel dilatation. Electronically Signed   By: Jorje Guild M.D.   On: 04/04/2021 06:47   ? ?Anti-infectives: ?Anti-infectives (From admission, onward)  ? ? None  ? ?  ? ? ?Assessment/Plan: ?SBO in setting of pancreatitis ?- Previous abdominal surgery -  PD catheter placement and removal ?- having bowel function with a BM yesterday, still with some nausea  ?-lipase has gone back to normal ?-diet as tolerates in relation to his  pancreatitis ?- SBO protocol - contrast in colon ?-given patient still having abdominal pain and saying "just operate on me" I will order a plain film just to confirm a nonobstructive gas pattern and that this is likely still more related to lingering pancreatitis. ?  ?ID - none ?VTE - sq heparin ?FEN - IVF, NPO ?Foley - none ?  ?ESRD on HD ?Lupus ?Chronic anemia ?HTN ?GERD ?Hx pancreatitis  ? ?I have reviewed hospitalist note, vitals for last 24 hrs, I/Os for last 24 hrs, along with labs and imaging. ? LOS: 5 days  ? ? ?Henreitta Cea ?04/05/2021 ? ?

## 2021-04-05 NOTE — Progress Notes (Signed)
?                                  PROGRESS NOTE                                             ?                                                                                                                     ?                                         ? ? Patient Demographics:  ? ? Nathan Castaneda, is a 46 y.o. male, DOB - Aug 11, 1975, [id number] ? ?Outpatient Primary MD for the patient is Patient, No Pcp Per (Inactive)    LOS - 5  Admit date - 03/31/2021   ? ?No chief complaint on file. ?    ? ?Brief Narrative (HPI from H&P)  - 46 y.o. African-American male with medical history significant for essential hypertension, lupus, end-stage renal disease on hemodialysis on TTS, previously on peritoneal dialysis, pancreatitis, who presented to the ER at [facility] with acute onset diffuse abdominal pain with associated nausea and vomiting for a day, found to have SBO. ? ? Subjective:  ? ?Patient in bed getting hemodialysis still complaining of some epigastric abdominal pain which is nonradiating but associated with mild nausea, no chest pain or shortness of breath no focal weakness. ? ? Assessment  & Plan :  ? ? ?Small bowel obstruction most likely partial in the setting of acute pancreatitis.  Being followed by general surgery pancreatitis has improved, is having some bowel activity, NG tube was removed by general surgery on 04/02/2021.  Was gradually advanced to regular by general surgery on 04/03/2021 but had emesis overnight again and now nauseated, turned down diet to clear liquids, supportive care will defer to general surgery on management of this problem. ? ?2. ESRD - Lupus -on HD, nephrology following. TTS,  Continue to monitor. ? ?3. H/O Pancreatitis - remote history of alcohol abuse but none in the last year, LFTs stable, right upper quadrant ultrasound shows stable CBD, stable triglycerides, IgG4 levels pending.  Lipase levels trending down, unfortunately he still continues  to have some epigastric discomfort off and on and feels nauseated, will involve GI as well to see if something else needs to be done. ? ?4. GERD - PPI. ? ?   ? ?Condition - Fair ? ?Family Communication  :  None present ? ?Code Status :  Full ? ?Consults  :  CCS, Renal, GI ? ?PUD Prophylaxis :  PPI ? ? Procedures  :    ? ? ? ?   ? ?  Disposition Plan  :   ? ?Status is: Inpatient ? ? ?DVT Prophylaxis  :   ? ?heparin injection 5,000 Units Start: 03/31/21 0600 ? ?Lab Results  ?Component Value Date  ? PLT 186 04/05/2021  ? ? ?Diet :  ?Diet Order   ? ?       ?  Diet clear liquid Room service appropriate? Yes; Fluid consistency: Thin  Diet effective now       ?  ? ?  ?  ? ?  ?  ? ?Inpatient Medications ? ?Scheduled Meds: ? chlorhexidine  15 mL Mouth Rinse BID  ? Chlorhexidine Gluconate Cloth  6 each Topical Q0600  ? darbepoetin (ARANESP) injection - NON-DIALYSIS  200 mcg Subcutaneous Q Thu-1800  ? doxercalciferol  1 mcg Intravenous Q T,Th,Sa-HD  ? heparin  5,000 Units Subcutaneous Q8H  ? mouth rinse  15 mL Mouth Rinse q12n4p  ? pantoprazole  40 mg Oral Daily  ? ?Continuous Infusions: ? sodium chloride    ? sodium chloride    ? promethazine (PHENERGAN) injection (IM or IVPB) 25 mg (04/04/21 2324)  ? ?PRN Meds:.sodium chloride, sodium chloride, acetaminophen **OR** acetaminophen, alteplase, heparin, HYDROmorphone (DILAUDID) injection, lidocaine (PF), lidocaine-prilocaine, ondansetron **OR** ondansetron (ZOFRAN) IV, pentafluoroprop-tetrafluoroeth, promethazine (PHENERGAN) injection (IM or IVPB), traZODone ? ?Antibiotics  :   ? ?Anti-infectives (From admission, onward)  ? ? None  ? ?  ? ? ? Time Spent in minutes  30 ? ? ?Lala Lund M.D on 04/05/2021 at 8:35 AM ? ?To page go to [email or web address]  ? ?[facility] -  Office  [phone] ? ?See all Orders from today for further details ? ? ? Objective:  ? ?Vitals:  ? 04/05/21 0000 04/05/21 0533 04/05/21 9379 04/05/21 0748  ?BP: 125/84 119/77 124/75 122/72  ?Pulse: 90 88 78 76   ?Resp: 16 16 20    ?Temp: 98.3 ?F (36.8 ?C) 98.2 ?F (36.8 ?C) 98.6 ?F (37 ?C)   ?TempSrc: Oral Oral Oral   ?SpO2:  95%    ?Weight:      ?Height:      ? ? ?Wt Readings from Last 3 Encounters:  ?04/02/21 68.5 kg  ?12/15/20 76 kg  ?10/20/19 75.3 kg  ? ? ? ?Intake/Output Summary (Last 24 hours) at 04/05/2021 0835 ?Last data filed at 04/04/2021 1552 ?Gross per 24 hour  ?Intake 50 ml  ?Output --  ?Net 50 ml  ? ? ? ?Physical Exam ? ?Awake Alert, No new F.N deficits, Normal affect ?[place].AT,PERRAL ?Supple Neck, No JVD,   ?Symmetrical Chest wall movement, Good air movement bilaterally, CTAB ?RRR,No Gallops, Rubs or new Murmurs,  ?+ve B.Sounds, mild epigastric tenderness,  ?No Cyanosis, Clubbing or edema  ? ? ? Data Review:  ? ? ?CBC ?Recent Labs  ?Lab 03/31/21 ?0240 04/02/21 ?0227 04/03/21 ?0304 04/04/21 ?0719 04/05/21 ?0240  ?WBC 6.4 5.4 5.1 5.4 4.8  ?HGB 8.6* 8.0* 7.8* 9.8* 9.1*  ?HCT 25.6* 24.6* 23.6* 30.3* 27.2*  ?PLT 151 158 135* 180 186  ?MCV 87.7 88.8 87.1 88.6 85.0  ?MCH 29.5 28.9 28.8 28.7 28.4  ?MCHC 33.6 32.5 33.1 32.3 33.5  ?RDW 18.6* 18.8* 18.1* 19.1* 18.1*  ?LYMPHSABS  --   --  2.0 0.8 1.8  ?MONOABS  --   --  0.4 0.3 0.5  ?EOSABS  --   --  0.1 0.0 0.0  ?BASOSABS  --   --  0.0 0.0 0.0  ? ? ?Electrolytes ?Recent Labs  ?Lab 04/01/21 ?0123 04/02/21 ?9735 04/03/21 ?3299 04/04/21 ?2426  04/05/21 ?0240  ?NA 140 134* 133* 135 135  ?K 4.3 4.1 3.7 4.2 5.5*  ?CL 103 99 98 96* 96*  ?CO2 24 24 27 27 22   ?GLUCOSE 63* 90 84 114* 82  ?BUN 50* 57* 23* 36* 49*  ?CREATININE 11.13* 13.49* 8.17* 11.42* 13.23*  ?CALCIUM 9.0 8.6* 8.1* 9.1 8.6*  ?AST  --  16 20 18  14*  ?ALT  --  13 15 16 11   ?ALKPHOS  --  61 73 78 67  ?BILITOT  --  0.7 0.7 0.9 1.1  ?ALBUMIN 2.8* 2.6* 2.4* 2.7* 2.6*  ?MG 2.1  --  1.7 1.8 1.7  ? ? ? ? ?Radiology Reports ?US Abdomen Complete ? ?Result Date: 04/02/2021 ?CLINICAL DATA:  Known history of small-bowel obstruction and abdominal pain, initial encounter EXAM: ABDOMEN ULTRASOUND COMPLETE COMPARISON:  CT from 03/30/2021  FINDINGS: Gallbladder: No gallstones or wall thickening visualized. No sonographic Murphy sign noted by sonographer. Common bile duct: Diameter: 7.8 mm Liver: No focal lesion identified. Within normal limits in parenchymal echogenicity. Portal vein is patent on color Doppler imaging with normal direction of blood flow towards the liver. IVC: No abnormality visualized. Pancreas: Visualized. Spleen: Size and appearance within normal limits. Right Kidney: Length: 8.9 cm. Mild increased echogenicity is noted. Upper pole cyst is noted similar to that seen on prior CT examination measuring 3 cm. Smaller cysts seen on prior CT are not well appreciated on this exam. Left Kidney: Length: 10.4 cm. Mild increased echogenicity is noted. Multiple cysts are noted the largest of which measures 1.9 cm similar to that seen on prior CT examination. Abdominal aorta: No aneurysm visualized. Other findings: Mild ascites is noted. IMPRESSION: Mild ascites. Bilateral renal cystic changes stable from previous CT. Nonvisualization of the pancreas. Electronically Signed   By: Inez Catalina M.D.   On: 04/02/2021 20:57  ? ?DG Chest Port 1 View ? ?Result Date: 04/03/2021 ?CLINICAL DATA:  46 year old male with history of abdominal distension and shortness of breath. EXAM: PORTABLE CHEST 1 VIEW COMPARISON:  Chest x-ray 03/30/2021. FINDINGS: The tip of a PermCath is noted in the right atrium, presumably from femoral approach. Left subclavian HERO graft tip terminates at the superior cavoatrial junction. Lung volumes are normal. No consolidative airspace disease. No pleural effusions. No pneumothorax. No pulmonary nodule or mass noted. Pulmonary vasculature and the cardiomediastinal silhouette are within normal limits. Atherosclerosis in the thoracic aorta. IMPRESSION: 1. Support apparatus, as above. 2. No radiographic evidence of acute cardiopulmonary disease. 3. Aortic atherosclerosis. Electronically Signed   By: Vinnie Langton M.D.   On:  04/03/2021 07:41  ? ?DG Abd Portable 1V ? ?Result Date: 04/04/2021 ?CLINICAL DATA:  Encounter for nausea and vomiting EXAM: PORTABLE ABDOMEN - 1 VIEW COMPARISON:  KUB from yesterday FINDINGS: Continued gas di

## 2021-04-05 NOTE — Procedures (Signed)
I was present at this dialysis session. I have reviewed the session itself and made appropriate changes.  ? ?K 5.5 this AM on 2K bath.  UF goal 2.5L.  Having ongoing N and some scant emesis this AM.  No BMs, not eating much.   ? ?R Fem TDC QB 350 working well. Cont HD on schedule.   ? ?Filed Weights  ? 04/01/21 0031 04/02/21 1335 04/02/21 1715  ?Weight: 67.5 kg 69.2 kg 68.5 kg  ? ? ?Recent Labs  ?Lab 04/01/21 ?0123 04/02/21 ?0227 04/05/21 ?0240  ?NA 140   < > 135  ?K 4.3   < > 5.5*  ?CL 103   < > 96*  ?CO2 24   < > 22  ?GLUCOSE 63*   < > 82  ?BUN 50*   < > 49*  ?CREATININE 11.13*   < > 13.23*  ?CALCIUM 9.0   < > 8.6*  ?PHOS 6.0*  --   --   ? < > = values in this interval not displayed.  ? ? ?Recent Labs  ?Lab 04/03/21 ?0304 04/04/21 ?0719 04/05/21 ?0240  ?WBC 5.1 5.4 4.8  ?NEUTROABS 2.6 4.2 2.4  ?HGB 7.8* 9.8* 9.1*  ?HCT 23.6* 30.3* 27.2*  ?MCV 87.1 88.6 85.0  ?PLT 135* 180 186  ? ? ?Scheduled Meds: ? chlorhexidine  15 mL Mouth Rinse BID  ? Chlorhexidine Gluconate Cloth  6 each Topical Q0600  ? darbepoetin (ARANESP) injection - NON-DIALYSIS  200 mcg Subcutaneous Q Thu-1800  ? doxercalciferol  1 mcg Intravenous Q T,Th,Sa-HD  ? heparin  5,000 Units Subcutaneous Q8H  ? mouth rinse  15 mL Mouth Rinse q12n4p  ? pantoprazole  40 mg Oral Daily  ? ?Continuous Infusions: ? sodium chloride    ? sodium chloride    ? promethazine (PHENERGAN) injection (IM or IVPB) 25 mg (04/04/21 2324)  ? ?PRN Meds:.sodium chloride, sodium chloride, acetaminophen **OR** acetaminophen, alteplase, heparin, HYDROmorphone (DILAUDID) injection, lidocaine (PF), lidocaine-prilocaine, ondansetron **OR** ondansetron (ZOFRAN) IV, pentafluoroprop-tetrafluoroeth, promethazine (PHENERGAN) injection (IM or IVPB), traZODone   ?Pearson Grippe  MD ?04/05/2021, 8:20 AM ?  ?

## 2021-04-05 NOTE — Progress Notes (Signed)
Pt receives out-pt HD at [facility] on TTS. Pt has a 11:00 chair time. Will assist as needed.  ? ?Melven Sartorius ?Renal Navigator ?[phone] ?

## 2021-04-06 DIAGNOSIS — K56609 Unspecified intestinal obstruction, unspecified as to partial versus complete obstruction: Secondary | ICD-10-CM | POA: Diagnosis not present

## 2021-04-06 LAB — COMPREHENSIVE METABOLIC PANEL
ALT: 10 U/L (ref 0–44)
AST: 11 U/L — ABNORMAL LOW (ref 15–41)
Albumin: 2.4 g/dL — ABNORMAL LOW (ref 3.5–5.0)
Alkaline Phosphatase: 62 U/L (ref 38–126)
Anion gap: 11 (ref 5–15)
BUN: 28 mg/dL — ABNORMAL HIGH (ref 6–20)
CO2: 26 mmol/L (ref 22–32)
Calcium: 8.5 mg/dL — ABNORMAL LOW (ref 8.9–10.3)
Chloride: 99 mmol/L (ref 98–111)
Creatinine, Ser: 8.99 mg/dL — ABNORMAL HIGH (ref 0.61–1.24)
GFR, Estimated: 7 mL/min — ABNORMAL LOW (ref >60)
Glucose, Bld: 83 mg/dL (ref 70–99)
Potassium: 4.1 mmol/L (ref 3.5–5.1)
Sodium: 136 mmol/L (ref 135–145)
Total Bilirubin: 0.9 mg/dL (ref 0.3–1.2)
Total Protein: 6.8 g/dL (ref 6.5–8.1)

## 2021-04-06 LAB — CBC WITH DIFFERENTIAL/PLATELET
Abs Immature Granulocytes: 0.02 10*3/uL (ref 0.00–0.07)
Basophils Absolute: 0 10*3/uL (ref 0.0–0.1)
Basophils Relative: 0 %
Eosinophils Absolute: 0.1 10*3/uL (ref 0.0–0.5)
Eosinophils Relative: 1 %
HCT: 23.9 % — ABNORMAL LOW (ref 39.0–52.0)
Hemoglobin: 7.8 g/dL — ABNORMAL LOW (ref 13.0–17.0)
Immature Granulocytes: 0 %
Lymphocytes Relative: 40 %
Lymphs Abs: 2.1 10*3/uL (ref 0.7–4.0)
MCH: 29.3 pg (ref 26.0–34.0)
MCHC: 32.6 g/dL (ref 30.0–36.0)
MCV: 89.8 fL (ref 80.0–100.0)
Monocytes Absolute: 0.6 10*3/uL (ref 0.1–1.0)
Monocytes Relative: 12 %
Neutro Abs: 2.5 10*3/uL (ref 1.7–7.7)
Neutrophils Relative %: 47 %
Platelets: 157 10*3/uL (ref 150–400)
RBC: 2.66 MIL/uL — ABNORMAL LOW (ref 4.22–5.81)
RDW: 19.8 % — ABNORMAL HIGH (ref 11.5–15.5)
WBC: 5.3 10*3/uL (ref 4.0–10.5)
nRBC: 0 % (ref 0.0–0.2)

## 2021-04-06 LAB — IGG 4: IgG, Subclass 4: 36 mg/dL (ref 2–96)

## 2021-04-06 LAB — LIPASE, BLOOD: Lipase: 25 U/L (ref 11–51)

## 2021-04-06 LAB — MAGNESIUM: Magnesium: 1.8 mg/dL (ref 1.7–2.4)

## 2021-04-06 MED ORDER — SODIUM CHLORIDE 0.9 % IV SOLN
25.0000 mg | Freq: Three times a day (TID) | INTRAVENOUS | Status: DC | PRN
  Administered 2021-04-06 – 2021-04-09 (×4): 25 mg via INTRAVENOUS
  Filled 2021-04-06 (×8): qty 1

## 2021-04-06 NOTE — Progress Notes (Signed)
Repeat films confirmed resolution of SBO.  Patient is also having some bowel function with BMs and gas as well.  Reviewed GI note and agree with their assessment as well.  No surgical plans.  His diet can be advanced as tolerated.  We will sign off at this time.  Call if you have questions or concerns. ? ?Henreitta Cea ?8:44 AM ?04/06/2021 ? ?

## 2021-04-06 NOTE — Progress Notes (Addendum)
?[facility] ?Progress Note  ? ?Subjective: SBO resolving. Surgery signed off. ESRD patients cannot take baclofen per GI recs. HD tomorrow on schedule.    ? ?Objective ?Vitals:  ? 04/05/21 2114 04/05/21 2354 04/06/21 0542 04/06/21 0746  ?BP: 102/65 99/63 (!) 80/59 (!) 89/55  ?Pulse: 97 89 86 79  ?Resp: 16 17 16 18   ?Temp: 99.1 ?F (37.3 ?C) 99.3 ?F (37.4 ?C) 98.7 ?F (37.1 ?C) 98 ?F (36.7 ?C)  ?TempSrc: Oral Oral Oral Oral  ?SpO2: 99% 98% 98%   ?Weight:      ?Height:      ? ?Physical Exam ?General: Chronically ill appearing male who looks much older than stated age in NAD ?Heart: S1,S2 RRR ?Lungs: CTAB ?Abdomen: NABS, NT ?Extremities:No LE edema ?Dialysis Access: L femoral TDC drsg intact ?  ? ? ?Additional Objective ?Labs: ?Basic Metabolic Panel: ?Recent Labs  ?Lab 03/31/21 ?4268 04/01/21 ?0123 04/02/21 ?3419 04/04/21 ?0719 04/05/21 ?6222 04/06/21 ?0327  ?NA 137 140   < > 135 135 136  ?K 5.1 4.3   < > 4.2 5.5* 4.1  ?CL 100 103   < > 96* 96* 99  ?CO2 26 24   < > 27 22 26   ?GLUCOSE 84 63*   < > 114* 82 83  ?BUN 62* 50*   < > 36* 49* 28*  ?CREATININE 13.06* 11.13*   < > 11.42* 13.23* 8.99*  ?CALCIUM 9.3 9.0   < > 9.1 8.6* 8.5*  ?PHOS 8.0* 6.0*  --   --   --   --   ? < > = values in this interval not displayed.  ? ?Liver Function Tests: ?Recent Labs  ?Lab 04/04/21 ?0719 04/05/21 ?9798 04/06/21 ?0327  ?AST 18 14* 11*  ?ALT 16 11 10   ?ALKPHOS 78 67 62  ?BILITOT 0.9 1.1 0.9  ?PROT 7.4 7.1 6.8  ?ALBUMIN 2.7* 2.6* 2.4*  ? ?Recent Labs  ?Lab 04/04/21 ?0719 04/05/21 ?9211 04/06/21 ?0327  ?LIPASE 78* 35 25  ? ?CBC: ?Recent Labs  ?Lab 04/02/21 ?0227 04/02/21 ?0227 04/03/21 ?0304 04/04/21 ?0719 04/05/21 ?9417 04/06/21 ?0327  ?WBC 5.4  --  5.1 5.4 4.8 5.3  ?NEUTROABS  --    < > 2.6 4.2 2.4 2.5  ?HGB 8.0*  --  7.8* 9.8* 9.1* 7.8*  ?HCT 24.6*  --  23.6* 30.3* 27.2* 23.9*  ?MCV 88.8  --  87.1 88.6 85.0 89.8  ?PLT 158  --  135* 180 186 157  ? < > = values in this interval not displayed.  ? ?Blood Culture ?No results  found for: SDES, [place], [place], REPTSTATUS ? ?Cardiac Enzymes: ?No results for input(s): CKTOTAL, CKMB, CKMBINDEX, TROPONINI in the last 168 hours. ?CBG: ?Recent Labs  ?Lab 04/01/21 ?0746 04/01/21 ?4081  ?GLUCAP 55* 97  ? ?Iron Studies: No results for input(s): IRON, TIBC, TRANSFERRIN, FERRITIN in the last 72 hours. ?@lablastinr3 @ ?Studies/Results: ?DG Abd Portable 1V ? ?Result Date: 04/05/2021 ?CLINICAL DATA:  Abdominal pain EXAM: PORTABLE ABDOMEN - 1 VIEW COMPARISON:  04/04/2021 FINDINGS: Nonobstructive pattern of bowel gas with gas-filled, nondistended loops of small bowel in the central abdomen and gas present to the descending colon. No radio-opaque calculi or other significant radiographic abnormality are seen. Partially imaged large-bore right femoral multi lumen vascular catheter. IMPRESSION: Nonobstructive pattern of bowel gas with gas-filled, nondistended loops of small bowel in the central abdomen and gas present to the descending colon. No free air on supine radiographs. Electronically Signed   By: Jamse Mead.D.  On: 04/05/2021 14:28   ?Medications: ? promethazine (PHENERGAN) injection (IM or IVPB) 25 mg (04/04/21 2324)  ? ? chlorhexidine  15 mL Mouth Rinse BID  ? Chlorhexidine Gluconate Cloth  6 each Topical Q0600  ? darbepoetin (ARANESP) injection - NON-DIALYSIS  200 mcg Subcutaneous Q Thu-1800  ? doxercalciferol  1 mcg Intravenous Q T,Th,Sa-HD  ? heparin  5,000 Units Subcutaneous Q8H  ? mouth rinse  15 mL Mouth Rinse q12n4p  ? pantoprazole  40 mg Oral Daily  ? ? ? ?ialysis Orders: ?TTS [facility] ? 4h  400/600 67.5kg  2/2 bath  R fem TDC   ?-Heparin 2900 units IV TIW ? - hectorol 1 ug tiw ? ?  ?Assessment/Plan: ?1. Small Bowel Obstruction: Likely from adhesions from previous CAPD v. pancreatitis ileus picture. NG tube removed, but nauseated this AM. Per primary/CCS.  ?2. H/O Pancreatitis-Lipase trending down (678 -> 45 today). No evidence of pancreatitis with imaging. NO BACLOFEN TO ESRD PATIENT.   ?3. ESRD: HD TTS - next HD 04/07/2021.  ?4. HTN/volume: BP controlled, no edema. ?4. Anemia of Chronic Kidney disease: HGB down to 7.8 today. No active bleeding reported. Rec'd Aranesp 200 mcg SQ 03/31/2021. Follow HGB.  ?5. Secondary hyperparathyroidism: Corrected Ca+9.7, no recent PO4. Binders on hold until cleared for solid diet. Hold VRDA.  ?6. Nutrition: Diet being advanced by primary.  ? ?Jimmye Norman. Masashi Snowdon NP-C ?04/06/2021, 10:51 AM  ?[facility] ?[phone] ? ? ?  ? ?

## 2021-04-06 NOTE — Progress Notes (Signed)
UNASSIGNED PATIENT ?Subjective: ?Seems to be improving with passage of flatus and BMs today. He is tolerating a clear diet well.  He still complains of epigastric pain. ? ?Objective: ?Vital signs in last 24 hours: ?Temp:  [98.2 ?F (36.8 ?C)-99.3 ?F (37.4 ?C)] 98.7 ?F (37.1 ?C) (03/29 0542) ?Pulse Rate:  [76-107] 86 (03/29 0542) ?Resp:  [15-20] 16 (03/29 0542) ?BP: (80-124)/(36-78) 80/59 (03/29 0542) ?SpO2:  [98 %-100 %] 98 % (03/29 0542) ?Weight:  [66.2 kg] 66.2 kg (03/28 1152) ?Last BM Date : 04/03/21 ? ?Intake/Output from previous day: ?03/28 0701 - 03/29 0700 ?In: -  ?Out: 1500  ?Intake/Output this shift: ?No intake/output data recorded. ? ?General appearance: alert, appears stated age, and no distress ?Resp: clear to auscultation bilaterally ?Cardio: regular rate and rhythm, S1, S2 normal, no murmur, click, rub or gallop ?GI: soft, non-tender; bowel sounds normal; no masses,  no organomegaly ? ?Lab Results: ?Recent Labs  ?  04/04/21 ?0719 04/05/21 ?2355 04/06/21 ?0327  ?WBC 5.4 4.8 5.3  ?HGB 9.8* 9.1* 7.8*  ?HCT 30.3* 27.2* 23.9*  ?PLT 180 186 157  ? ?BMET ?Recent Labs  ?  04/04/21 ?0719 04/05/21 ?7322 04/06/21 ?0327  ?NA 135 135 136  ?K 4.2 5.5* 4.1  ?CL 96* 96* 99  ?CO2 27 22 26   ?GLUCOSE 114* 82 83  ?BUN 36* 49* 28*  ?CREATININE 11.42* 13.23* 8.99*  ?CALCIUM 9.1 8.6* 8.5*  ? ?LFT ?Recent Labs  ?  04/06/21 ?0327  ?PROT 6.8  ?ALBUMIN 2.4*  ?AST 11*  ?ALT 10  ?ALKPHOS 62  ?BILITOT 0.9  ? ?PT/INR ?No results for input(s): LABPROT, INR in the last 72 hours. ?Hepatitis Panel ?No results for input(s): HEPBSAG, HCVAB, HEPAIGM, HEPBIGM in the last 72 hours. ?C-Diff ?No results for input(s): CDIFFTOX in the last 72 hours. ?No results for input(s): CDIFFPCR in the last 72 hours. ?Fecal Lactopherrin ?No results for input(s): FECLLACTOFRN in the last 72 hours. ? ?Studies/Results: ?DG Abd Portable 1V ? ?Result Date: 04/05/2021 ?CLINICAL DATA:  Abdominal pain EXAM: PORTABLE ABDOMEN - 1 VIEW COMPARISON:  04/04/2021 FINDINGS:  Nonobstructive pattern of bowel gas with gas-filled, nondistended loops of small bowel in the central abdomen and gas present to the descending colon. No radio-opaque calculi or other significant radiographic abnormality are seen. Partially imaged large-bore right femoral multi lumen vascular catheter. IMPRESSION: Nonobstructive pattern of bowel gas with gas-filled, nondistended loops of small bowel in the central abdomen and gas present to the descending colon. No free air on supine radiographs. Electronically Signed   By: Delanna Ahmadi M.D.   On: 04/05/2021 14:28   ? ?Medications: I have reviewed the patient's current medications. ?Prior to Admission:  ?Medications Prior to Admission  ?Medication Sig Dispense Refill Last Dose  ? b complex-vitamin c-folic acid (NEPHRO-VITE) 0.8 MG TABS tablet Take 1 tablet by mouth daily.   Past Week  ? calcitRIOL (ROCALTROL) 0.25 MCG capsule Take 0.25 mcg by mouth daily.   Past Week  ? ferric citrate (AURYXIA) 1 GM 210 MG(Fe) tablet Take 210 mg by mouth in the morning, at noon, and at bedtime.   Past Week  ? furosemide (LASIX) 80 MG tablet Take 80 mg by mouth daily.   Past Week  ? sildenafil (VIAGRA) 100 MG tablet Take 100 mg by mouth daily as needed for erectile dysfunction.   unk  ? ?Scheduled: ? chlorhexidine  15 mL Mouth Rinse BID  ? Chlorhexidine Gluconate Cloth  6 each Topical Q0600  ? darbepoetin (ARANESP) injection -  NON-DIALYSIS  200 mcg Subcutaneous Q Thu-1800  ? heparin  5,000 Units Subcutaneous Q8H  ? mouth rinse  15 mL Mouth Rinse q12n4p  ? pantoprazole  40 mg Oral Daily  ? ?Continuous: ? chlorproMAZINE (THORAZINE) IV Stopped (04/06/21 1445)  ? promethazine (PHENERGAN) injection (IM or IVPB) Stopped (04/04/21 2339)  ? ?Assessment/Plan: ?1) Small bowel obstruction most likely due to above acute pancreatitis seems to have improved. 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 ?3) Acute pancreatitis-significantly improved IgG4 G4 levels  pending ? LOS: 6 days  ? ?Cristiano Capri ?04/06/2021, 7:19 AM ? ? ?

## 2021-04-06 NOTE — Progress Notes (Signed)
?                                  PROGRESS NOTE                                             ?                                                                                                                     ?                                         ? ? Patient Demographics:  ? ? Nathan Castaneda, is a 46 y.o. male, DOB - October 22, 1975, [id number] ? ?Outpatient Primary MD for the patient is Patient, No Pcp Per (Inactive)    LOS - 6  Admit date - 03/31/2021   ? ?No chief complaint on file. ?    ? ?Brief Narrative (HPI from H&P)  - 46 y.o. African-American male with medical history significant for essential hypertension, lupus, end-stage renal disease on hemodialysis on TTS, previously on peritoneal dialysis, pancreatitis, who presented to the ER at [facility] with acute onset diffuse abdominal pain with associated nausea and vomiting for a day, found to have SBO. ? ? Subjective:  ? ?Patient in bed denies any chest pain, no nausea but still having epigastric abdominal pain, having bowel movements and passing flatus, no focal weakness. ? ? Assessment  & Plan :  ? ? ?Small bowel obstruction most likely partial in the setting of acute pancreatitis.  Being followed by general surgery pancreatitis has improved, is having some bowel activity, NG tube was removed by general surgery on 04/02/2021.  Per general surgery SBO has resolved will advance clear liquid diet to soft diet and monitor. ? ?2. ESRD - Lupus -on HD, nephrology following. TTS,  Continue to monitor. ? ?3. H/O Pancreatitis - remote history of alcohol abuse but none in the last year, LFTs stable, right upper quadrant ultrasound shows stable CBD, stable triglycerides, IgG4 levels pending.  Lipase levels trending down, unfortunately he still continues to have some epigastric discomfort off and on and feels nauseated, GI saw the patient looks like pancreatitis has clinically resolved.  He was also having some hiccups which are  better with baclofen.  Continue to monitor. ? ?4. GERD - PPI. ? ?   ? ?Condition - Fair ? ?Family Communication  :  None present ? ?Code Status :  Full ? ?Consults  :  CCS, Renal, GI ? ?PUD Prophylaxis :  PPI ? ? Procedures  :    ? ? ? ?   ? ?Disposition Plan  :   ? ?Status is: Inpatient ? ? ?  DVT Prophylaxis  :   ? ?heparin injection 5,000 Units Start: 03/31/21 0600 ? ?Lab Results  ?Component Value Date  ? PLT 157 04/06/2021  ? ? ?Diet :  ?Diet Order   ? ?       ?  Diet clear liquid Room service appropriate? Yes; Fluid consistency: Thin  Diet effective now       ?  ? ?  ?  ? ?  ?  ? ?Inpatient Medications ? ?Scheduled Meds: ? chlorhexidine  15 mL Mouth Rinse BID  ? Chlorhexidine Gluconate Cloth  6 each Topical Q0600  ? darbepoetin (ARANESP) injection - NON-DIALYSIS  200 mcg Subcutaneous Q Thu-1800  ? doxercalciferol  1 mcg Intravenous Q T,Th,Sa-HD  ? heparin  5,000 Units Subcutaneous Q8H  ? mouth rinse  15 mL Mouth Rinse q12n4p  ? pantoprazole  40 mg Oral Daily  ? ?Continuous Infusions: ? promethazine (PHENERGAN) injection (IM or IVPB) 25 mg (04/04/21 2324)  ? ?PRN Meds:.acetaminophen **OR** acetaminophen, HYDROmorphone (DILAUDID) injection, ondansetron **OR** ondansetron (ZOFRAN) IV, promethazine (PHENERGAN) injection (IM or IVPB), traZODone ? ?Antibiotics  :   ? ?Anti-infectives (From admission, onward)  ? ? None  ? ?  ? ? ? Time Spent in minutes  30 ? ? ?Lala Lund M.D on 04/06/2021 at 9:46 AM ? ?To page go to [email or web address]  ? ?[facility] -  Office  [phone] ? ?See all Orders from today for further details ? ? ? Objective:  ? ?Vitals:  ? 04/05/21 2114 04/05/21 2354 04/06/21 0542 04/06/21 0746  ?BP: 102/65 99/63 (!) 80/59 (!) 89/55  ?Pulse: 97 89 86 79  ?Resp: 16 17 16 18   ?Temp: 99.1 ?F (37.3 ?C) 99.3 ?F (37.4 ?C) 98.7 ?F (37.1 ?C) 98 ?F (36.7 ?C)  ?TempSrc: Oral Oral Oral Oral  ?SpO2: 99% 98% 98%   ?Weight:      ?Height:      ? ? ?Wt Readings from Last 3 Encounters:  ?04/05/21 66.2 kg  ?12/15/20  76 kg  ?10/20/19 75.3 kg  ? ? ? ?Intake/Output Summary (Last 24 hours) at 04/06/2021 0946 ?Last data filed at 04/05/2021 1152 ?Gross per 24 hour  ?Intake --  ?Output 1500 ml  ?Net -1500 ml  ? ? ? ?Physical Exam ? ?Awake Alert, No new F.N deficits, Normal affect ?[place].AT,PERRAL ?Supple Neck, No JVD,   ?Symmetrical Chest wall movement, Good air movement bilaterally, CTAB ?RRR,No Gallops, Rubs or new Murmurs,  ?+ve B.Sounds, Abd Soft, +ve epigastric tenderness ?No Cyanosis, Clubbing or edema  ? ? ? ? Data Review:  ? ? ?CBC ?Recent Labs  ?Lab 04/02/21 ?0227 04/03/21 ?0304 04/04/21 ?0719 04/05/21 ?2542 04/06/21 ?0327  ?WBC 5.4 5.1 5.4 4.8 5.3  ?HGB 8.0* 7.8* 9.8* 9.1* 7.8*  ?HCT 24.6* 23.6* 30.3* 27.2* 23.9*  ?PLT 158 135* 180 186 157  ?MCV 88.8 87.1 88.6 85.0 89.8  ?MCH 28.9 28.8 28.7 28.4 29.3  ?MCHC 32.5 33.1 32.3 33.5 32.6  ?RDW 18.8* 18.1* 19.1* 18.1* 19.8*  ?LYMPHSABS  --  2.0 0.8 1.8 2.1  ?MONOABS  --  0.4 0.3 0.5 0.6  ?EOSABS  --  0.1 0.0 0.0 0.1  ?BASOSABS  --  0.0 0.0 0.0 0.0  ? ? ?Electrolytes ?Recent Labs  ?Lab 04/01/21 ?0123 04/02/21 ?0227 04/03/21 ?0304 04/04/21 ?0719 04/05/21 ?7062 04/06/21 ?0327  ?NA 140 134* 133* 135 135 136  ?K 4.3 4.1 3.7 4.2 5.5* 4.1  ?CL 103 99 98 96* 96* 99  ?CO2 24 24 27 27  22  26  ?GLUCOSE 63* 90 84 114* 82 83  ?BUN 50* 57* 23* 36* 49* 28*  ?CREATININE 11.13* 13.49* 8.17* 11.42* 13.23* 8.99*  ?CALCIUM 9.0 8.6* 8.1* 9.1 8.6* 8.5*  ?AST  --  16 20 18  14* 11*  ?ALT  --  13 15 16 11 10   ?ALKPHOS  --  61 73 78 67 62  ?BILITOT  --  0.7 0.7 0.9 1.1 0.9  ?ALBUMIN 2.8* 2.6* 2.4* 2.7* 2.6* 2.4*  ?MG 2.1  --  1.7 1.8 1.7 1.8  ? ? ? ? ?Radiology Reports ?US Abdomen Complete ? ?Result Date: 04/02/2021 ?CLINICAL DATA:  Known history of small-bowel obstruction and abdominal pain, initial encounter EXAM: ABDOMEN ULTRASOUND COMPLETE COMPARISON:  CT from 03/30/2021 FINDINGS: Gallbladder: No gallstones or wall thickening visualized. No sonographic Murphy sign noted by sonographer. Common bile duct:  Diameter: 7.8 mm Liver: No focal lesion identified. Within normal limits in parenchymal echogenicity. Portal vein is patent on color Doppler imaging with normal direction of blood flow towards the liver. IVC: No abnormality visualized. Pancreas: Visualized. Spleen: Size and appearance within normal limits. Right Kidney: Length: 8.9 cm. Mild increased echogenicity is noted. Upper pole cyst is noted similar to that seen on prior CT examination measuring 3 cm. Smaller cysts seen on prior CT are not well appreciated on this exam. Left Kidney: Length: 10.4 cm. Mild increased echogenicity is noted. Multiple cysts are noted the largest of which measures 1.9 cm similar to that seen on prior CT examination. Abdominal aorta: No aneurysm visualized. Other findings: Mild ascites is noted. IMPRESSION: Mild ascites. Bilateral renal cystic changes stable from previous CT. Nonvisualization of the pancreas. Electronically Signed   By: Inez Catalina M.D.   On: 04/02/2021 20:57  ? ?DG Chest Port 1 View ? ?Result Date: 04/03/2021 ?CLINICAL DATA:  46 year old male with history of abdominal distension and shortness of breath. EXAM: PORTABLE CHEST 1 VIEW COMPARISON:  Chest x-ray 03/30/2021. FINDINGS: The tip of a PermCath is noted in the right atrium, presumably from femoral approach. Left subclavian HERO graft tip terminates at the superior cavoatrial junction. Lung volumes are normal. No consolidative airspace disease. No pleural effusions. No pneumothorax. No pulmonary nodule or mass noted. Pulmonary vasculature and the cardiomediastinal silhouette are within normal limits. Atherosclerosis in the thoracic aorta. IMPRESSION: 1. Support apparatus, as above. 2. No radiographic evidence of acute cardiopulmonary disease. 3. Aortic atherosclerosis. Electronically Signed   By: Vinnie Langton M.D.   On: 04/03/2021 07:41  ? ?DG Abd Portable 1V ? ?Result Date: 04/05/2021 ?CLINICAL DATA:  Abdominal pain EXAM: PORTABLE ABDOMEN - 1 VIEW COMPARISON:   04/04/2021 FINDINGS: Nonobstructive pattern of bowel gas with gas-filled, nondistended loops of small bowel in the central abdomen and gas present to the descending colon. No radio-opaque calculi or other si

## 2021-04-07 ENCOUNTER — Inpatient Hospital Stay (HOSPITAL_COMMUNITY): Payer: Medicare Other

## 2021-04-07 DIAGNOSIS — K56609 Unspecified intestinal obstruction, unspecified as to partial versus complete obstruction: Secondary | ICD-10-CM | POA: Diagnosis not present

## 2021-04-07 LAB — CBC WITH DIFFERENTIAL/PLATELET
Abs Immature Granulocytes: 0.06 10*3/uL (ref 0.00–0.07)
Basophils Absolute: 0 10*3/uL (ref 0.0–0.1)
Basophils Relative: 0 %
Eosinophils Absolute: 0.1 10*3/uL (ref 0.0–0.5)
Eosinophils Relative: 1 %
HCT: 25.6 % — ABNORMAL LOW (ref 39.0–52.0)
Hemoglobin: 8.1 g/dL — ABNORMAL LOW (ref 13.0–17.0)
Immature Granulocytes: 1 %
Lymphocytes Relative: 35 %
Lymphs Abs: 2 10*3/uL (ref 0.7–4.0)
MCH: 27.3 pg (ref 26.0–34.0)
MCHC: 31.6 g/dL (ref 30.0–36.0)
MCV: 86.2 fL (ref 80.0–100.0)
Monocytes Absolute: 0.5 10*3/uL (ref 0.1–1.0)
Monocytes Relative: 9 %
Neutro Abs: 3 10*3/uL (ref 1.7–7.7)
Neutrophils Relative %: 54 %
Platelets: UNDETERMINED 10*3/uL (ref 150–400)
RBC: 2.97 MIL/uL — ABNORMAL LOW (ref 4.22–5.81)
RDW: 18.3 % — ABNORMAL HIGH (ref 11.5–15.5)
WBC: 5.6 10*3/uL (ref 4.0–10.5)
nRBC: 0 % (ref 0.0–0.2)

## 2021-04-07 LAB — COMPREHENSIVE METABOLIC PANEL
ALT: 9 U/L (ref 0–44)
AST: 14 U/L — ABNORMAL LOW (ref 15–41)
Albumin: 2.5 g/dL — ABNORMAL LOW (ref 3.5–5.0)
Alkaline Phosphatase: 66 U/L (ref 38–126)
Anion gap: 12 (ref 5–15)
BUN: 38 mg/dL — ABNORMAL HIGH (ref 6–20)
CO2: 26 mmol/L (ref 22–32)
Calcium: 9.1 mg/dL (ref 8.9–10.3)
Chloride: 100 mmol/L (ref 98–111)
Creatinine, Ser: 11.43 mg/dL — ABNORMAL HIGH (ref 0.61–1.24)
GFR, Estimated: 5 mL/min — ABNORMAL LOW (ref >60)
Glucose, Bld: 84 mg/dL (ref 70–99)
Potassium: 4.2 mmol/L (ref 3.5–5.1)
Sodium: 138 mmol/L (ref 135–145)
Total Bilirubin: 0.6 mg/dL (ref 0.3–1.2)
Total Protein: 7.1 g/dL (ref 6.5–8.1)

## 2021-04-07 LAB — MAGNESIUM: Magnesium: 2 mg/dL (ref 1.7–2.4)

## 2021-04-07 LAB — LIPASE, BLOOD: Lipase: 58 U/L — ABNORMAL HIGH (ref 11–51)

## 2021-04-07 MED ORDER — HEPARIN SODIUM (PORCINE) 1000 UNIT/ML IJ SOLN
INTRAMUSCULAR | Status: AC
  Administered 2021-04-07: 1000 [IU]
  Filled 2021-04-07: qty 6

## 2021-04-07 NOTE — Progress Notes (Signed)
Subjective: ?He still reports epigastric pain and singultus. ? ?Objective: ?Vital signs in last 24 hours: ?Temp:  [97.8 ?F (36.6 ?C)-98.8 ?F (37.1 ?C)] 97.8 ?F (36.6 ?C) (03/30 0320) ?Pulse Rate:  [72-86] 76 (03/30 0320) ?Resp:  [18-19] 18 (03/30 0320) ?BP: (93-106)/(59-72) 106/71 (03/30 0320) ?Last BM Date : 04/05/21 ? ?Intake/Output from previous day: ?03/29 0701 - 03/30 0700 ?In: 375.1 [P.O.:240; IV Piggyback:135.1] ?Out: -  ?Intake/Output this shift: ?No intake/output data recorded. ? ?General appearance: alert and no distress ?GI: soft, non-tender; bowel sounds normal; no masses,  no organomegaly ? ?Lab Results: ?Recent Labs  ?  04/05/21 ?2119 04/06/21 ?0327 04/07/21 ?0159  ?WBC 4.8 5.3 5.6  ?HGB 9.1* 7.8* 8.1*  ?HCT 27.2* 23.9* 25.6*  ?PLT 186 157 PLATELET CLUMPS NOTED ON SMEAR, UNABLE TO ESTIMATE  ? ?BMET ?Recent Labs  ?  04/05/21 ?4174 04/06/21 ?0327 04/07/21 ?0159  ?NA 135 136 138  ?K 5.5* 4.1 4.2  ?CL 96* 99 100  ?CO2 22 26 26   ?GLUCOSE 82 83 84  ?BUN 49* 28* 38*  ?CREATININE 13.23* 8.99* 11.43*  ?CALCIUM 8.6* 8.5* 9.1  ? ?LFT ?Recent Labs  ?  04/07/21 ?0159  ?PROT 7.1  ?ALBUMIN 2.5*  ?AST 14*  ?ALT 9  ?ALKPHOS 66  ?BILITOT 0.6  ? ?PT/INR ?No results for input(s): LABPROT, INR in the last 72 hours. ?Hepatitis Panel ?No results for input(s): HEPBSAG, HCVAB, HEPAIGM, HEPBIGM in the last 72 hours. ?C-Diff ?No results for input(s): CDIFFTOX in the last 72 hours. ?Fecal Lactopherrin ?No results for input(s): FECLLACTOFRN in the last 72 hours. ? ?Studies/Results: ?DG Abd Portable 1V ? ?Result Date: 04/05/2021 ?CLINICAL DATA:  Abdominal pain EXAM: PORTABLE ABDOMEN - 1 VIEW COMPARISON:  04/04/2021 FINDINGS: Nonobstructive pattern of bowel gas with gas-filled, nondistended loops of small bowel in the central abdomen and gas present to the descending colon. No radio-opaque calculi or other significant radiographic abnormality are seen. Partially imaged large-bore right femoral multi lumen vascular catheter.  IMPRESSION: Nonobstructive pattern of bowel gas with gas-filled, nondistended loops of small bowel in the central abdomen and gas present to the descending colon. No free air on supine radiographs. Electronically Signed   By: Delanna Ahmadi M.D.   On: 04/05/2021 14:28   ? ?Medications: Scheduled: ? chlorhexidine  15 mL Mouth Rinse BID  ? Chlorhexidine Gluconate Cloth  6 each Topical Q0600  ? darbepoetin (ARANESP) injection - NON-DIALYSIS  200 mcg Subcutaneous Q Thu-1800  ? heparin  5,000 Units Subcutaneous Q8H  ? mouth rinse  15 mL Mouth Rinse q12n4p  ? pantoprazole  40 mg Oral Daily  ? ?Continuous: ? chlorproMAZINE (THORAZINE) IV Stopped (04/06/21 1445)  ? promethazine (PHENERGAN) injection (IM or IVPB) Stopped (04/04/21 2339)  ? ? ?Assessment/Plan: ?1) Hiccups - persistent per the patient. ?2) Epigastric pain. ?3) SBO - resolved. ? ? The patient continues to complain about hiccups and epigastric pain.  There was no pain with palpation this AM.  Thorazine was used to treat the hiccups as baclofen is contraindicated in ESRD.  Further evaluation with an EGD will be performed. ? ?Plan: ?1) EGD tomorrow. ? LOS: 7 days  ? ?Vence Lalor D ?04/07/2021, 8:10 AM  ?

## 2021-04-07 NOTE — H&P (View-Only) (Signed)
Subjective: ?No acute events.  He continues to complain about hiccups and epigastric abdominal pain. ? ?Objective: ?Vital signs in last 24 hours: ?Temp:  [97.8 ?F (36.6 ?C)-98.8 ?F (37.1 ?C)] 98.6 ?F (37 ?C) (03/30 1401) ?Pulse Rate:  [65-86] 72 (03/30 1500) ?Resp:  [16-19] 16 (03/30 1312) ?BP: (93-107)/(63-72) 93/67 (03/30 1500) ?SpO2:  [100 %] 100 % (03/30 1312) ?Last BM Date : 04/06/21 ? ?Intake/Output from previous day: ?03/29 0701 - 03/30 0700 ?In: 375.1 [P.O.:240; IV Piggyback:135.1] ?Out: -  ?Intake/Output this shift: ?Total I/O ?In: 240 [P.O.:240] ?Out: -  ? ?General appearance: no distress ?GI: tender in the epigastrium ? ?Lab Results: ?Recent Labs  ?  04/05/21 ?2409 04/06/21 ?0327 04/07/21 ?0159  ?WBC 4.8 5.3 5.6  ?HGB 9.1* 7.8* 8.1*  ?HCT 27.2* 23.9* 25.6*  ?PLT 186 157 PLATELET CLUMPS NOTED ON SMEAR, UNABLE TO ESTIMATE  ? ?BMET ?Recent Labs  ?  04/05/21 ?7353 04/06/21 ?0327 04/07/21 ?0159  ?NA 135 136 138  ?K 5.5* 4.1 4.2  ?CL 96* 99 100  ?CO2 22 26 26   ?GLUCOSE 82 83 84  ?BUN 49* 28* 38*  ?CREATININE 13.23* 8.99* 11.43*  ?CALCIUM 8.6* 8.5* 9.1  ? ?LFT ?Recent Labs  ?  04/07/21 ?0159  ?PROT 7.1  ?ALBUMIN 2.5*  ?AST 14*  ?ALT 9  ?ALKPHOS 66  ?BILITOT 0.6  ? ?PT/INR ?No results for input(s): LABPROT, INR in the last 72 hours. ?Hepatitis Panel ?No results for input(s): HEPBSAG, HCVAB, HEPAIGM, HEPBIGM in the last 72 hours. ?C-Diff ?No results for input(s): CDIFFTOX in the last 72 hours. ?Fecal Lactopherrin ?No results for input(s): FECLLACTOFRN in the last 72 hours. ? ?Studies/Results: ?No results found. ? ?Medications: Scheduled: ? chlorhexidine  15 mL Mouth Rinse BID  ? Chlorhexidine Gluconate Cloth  6 each Topical Q0600  ? darbepoetin (ARANESP) injection - NON-DIALYSIS  200 mcg Subcutaneous Q Thu-1800  ? heparin  5,000 Units Subcutaneous Q8H  ? mouth rinse  15 mL Mouth Rinse q12n4p  ? pantoprazole  40 mg Oral Daily  ? ?Continuous: ? chlorproMAZINE (THORAZINE) IV 25 mg (04/07/21 1034)  ? promethazine  (PHENERGAN) injection (IM or IVPB) Stopped (04/04/21 2339)  ? ? ?Assessment/Plan: ?1) Epigastric abdominal pain. ?2) Hiccups. ? ? The source of his symptoms are not known.  Further work up with an EGD is reasonable. ? ?Plan: ?1) EGD tomorrow. ? LOS: 7 days  ? ?Lehi Phifer D ?04/07/2021, 3:29 PM  ?

## 2021-04-07 NOTE — Progress Notes (Signed)
Subjective: ?No acute events.  He continues to complain about hiccups and epigastric abdominal pain. ? ?Objective: ?Vital signs in last 24 hours: ?Temp:  [97.8 ?F (36.6 ?C)-98.8 ?F (37.1 ?C)] 98.6 ?F (37 ?C) (03/30 1401) ?Pulse Rate:  [65-86] 72 (03/30 1500) ?Resp:  [16-19] 16 (03/30 1312) ?BP: (93-107)/(63-72) 93/67 (03/30 1500) ?SpO2:  [100 %] 100 % (03/30 1312) ?Last BM Date : 04/06/21 ? ?Intake/Output from previous day: ?03/29 0701 - 03/30 0700 ?In: 375.1 [P.O.:240; IV Piggyback:135.1] ?Out: -  ?Intake/Output this shift: ?Total I/O ?In: 240 [P.O.:240] ?Out: -  ? ?General appearance: no distress ?GI: tender in the epigastrium ? ?Lab Results: ?Recent Labs  ?  04/05/21 ?7096 04/06/21 ?0327 04/07/21 ?0159  ?WBC 4.8 5.3 5.6  ?HGB 9.1* 7.8* 8.1*  ?HCT 27.2* 23.9* 25.6*  ?PLT 186 157 PLATELET CLUMPS NOTED ON SMEAR, UNABLE TO ESTIMATE  ? ?BMET ?Recent Labs  ?  04/05/21 ?2836 04/06/21 ?0327 04/07/21 ?0159  ?NA 135 136 138  ?K 5.5* 4.1 4.2  ?CL 96* 99 100  ?CO2 22 26 26   ?GLUCOSE 82 83 84  ?BUN 49* 28* 38*  ?CREATININE 13.23* 8.99* 11.43*  ?CALCIUM 8.6* 8.5* 9.1  ? ?LFT ?Recent Labs  ?  04/07/21 ?0159  ?PROT 7.1  ?ALBUMIN 2.5*  ?AST 14*  ?ALT 9  ?ALKPHOS 66  ?BILITOT 0.6  ? ?PT/INR ?No results for input(s): LABPROT, INR in the last 72 hours. ?Hepatitis Panel ?No results for input(s): HEPBSAG, HCVAB, HEPAIGM, HEPBIGM in the last 72 hours. ?C-Diff ?No results for input(s): CDIFFTOX in the last 72 hours. ?Fecal Lactopherrin ?No results for input(s): FECLLACTOFRN in the last 72 hours. ? ?Studies/Results: ?No results found. ? ?Medications: Scheduled: ? chlorhexidine  15 mL Mouth Rinse BID  ? Chlorhexidine Gluconate Cloth  6 each Topical Q0600  ? darbepoetin (ARANESP) injection - NON-DIALYSIS  200 mcg Subcutaneous Q Thu-1800  ? heparin  5,000 Units Subcutaneous Q8H  ? mouth rinse  15 mL Mouth Rinse q12n4p  ? pantoprazole  40 mg Oral Daily  ? ?Continuous: ? chlorproMAZINE (THORAZINE) IV 25 mg (04/07/21 1034)  ? promethazine  (PHENERGAN) injection (IM or IVPB) Stopped (04/04/21 2339)  ? ? ?Assessment/Plan: ?1) Epigastric abdominal pain. ?2) Hiccups. ? ? The source of his symptoms are not known.  Further work up with an EGD is reasonable. ? ?Plan: ?1) EGD tomorrow. ? LOS: 7 days  ? ?Nathan Castaneda D ?04/07/2021, 3:29 PM  ?

## 2021-04-07 NOTE — Progress Notes (Signed)
?[facility] ?Progress Note  ? ?Subjective:    ?Seemed to have improved yesterday, but abd pain has worsened this AM. EGD scheduled for tomorrow with GI. Intermittent hiccups during exam; given thorazine yesterday. Plan for dialysis today.  ? ?Objective ?Vitals:  ? 04/06/21 1953 04/06/21 2315 04/07/21 0320 04/07/21 0805  ?BP: 102/68 100/72 106/71 102/63  ?Pulse: 77 72 76 75  ?Resp: 18 18 18 17   ?Temp: 98.8 ?F (37.1 ?C) 97.8 ?F (36.6 ?C) 97.8 ?F (36.6 ?C) 98.7 ?F (37.1 ?C)  ?TempSrc: Oral Oral Oral Oral  ?SpO2:    100%  ?Weight:      ?Height:      ? ?Physical Exam ?General: Chronically ill appearing male NAD, +hiccups ?Heart: S1,S2 RRR ?Lungs: CTAB ?Abdomen: NABS, mildly tender on lower quadrants ?Extremities:No LE edema ?Dialysis Access: L femoral TDC drsg intact  ? ?Additional Objective ?Labs: ?Basic Metabolic Panel: ?Recent Labs  ?Lab 04/01/21 ?0123 04/02/21 ?0227 04/05/21 ?1610 04/06/21 ?0327 04/07/21 ?0159  ?NA 140   < > 135 136 138  ?K 4.3   < > 5.5* 4.1 4.2  ?CL 103   < > 96* 99 100  ?CO2 24   < > 22 26 26   ?GLUCOSE 63*   < > 82 83 84  ?BUN 50*   < > 49* 28* 38*  ?CREATININE 11.13*   < > 13.23* 8.99* 11.43*  ?CALCIUM 9.0   < > 8.6* 8.5* 9.1  ?PHOS 6.0*  --   --   --   --   ? < > = values in this interval not displayed.  ? ?Liver Function Tests: ?Recent Labs  ?Lab 04/05/21 ?9604 04/06/21 ?0327 04/07/21 ?0159  ?AST 14* 11* 14*  ?ALT 11 10 9   ?ALKPHOS 67 62 66  ?BILITOT 1.1 0.9 0.6  ?PROT 7.1 6.8 7.1  ?ALBUMIN 2.6* 2.4* 2.5*  ? ?Recent Labs  ?Lab 04/05/21 ?5409 04/06/21 ?0327 04/07/21 ?0159  ?LIPASE 35 25 58*  ? ?CBC: ?Recent Labs  ?Lab 04/03/21 ?0304 04/04/21 ?0719 04/05/21 ?8119 04/06/21 ?0327 04/07/21 ?0159  ?WBC 5.1 5.4 4.8 5.3 5.6  ?NEUTROABS 2.6 4.2 2.4 2.5 3.0  ?HGB 7.8* 9.8* 9.1* 7.8* 8.1*  ?HCT 23.6* 30.3* 27.2* 23.9* 25.6*  ?MCV 87.1 88.6 85.0 89.8 86.2  ?PLT 135* 180 186 157 PLATELET CLUMPS NOTED ON SMEAR, UNABLE TO ESTIMATE  ? ?Blood Culture ?No results found for: SDES, Rodriguez Hevia,  [place], REPTSTATUS ? ?Cardiac Enzymes: ?No results for input(s): CKTOTAL, CKMB, CKMBINDEX, TROPONINI in the last 168 hours. ?CBG: ?Recent Labs  ?Lab 04/01/21 ?0746 04/01/21 ?1478  ?GLUCAP 55* 97  ? ?Iron Studies: No results for input(s): IRON, TIBC, TRANSFERRIN, FERRITIN in the last 72 hours. ?@lablastinr3 @ ?Studies/Results: ?DG Abd Portable 1V ? ?Result Date: 04/05/2021 ?CLINICAL DATA:  Abdominal pain EXAM: PORTABLE ABDOMEN - 1 VIEW COMPARISON:  04/04/2021 FINDINGS: Nonobstructive pattern of bowel gas with gas-filled, nondistended loops of small bowel in the central abdomen and gas present to the descending colon. No radio-opaque calculi or other significant radiographic abnormality are seen. Partially imaged large-bore right femoral multi lumen vascular catheter. IMPRESSION: Nonobstructive pattern of bowel gas with gas-filled, nondistended loops of small bowel in the central abdomen and gas present to the descending colon. No free air on supine radiographs. Electronically Signed   By: Delanna Ahmadi M.D.   On: 04/05/2021 14:28   ?Medications: ? chlorproMAZINE (THORAZINE) IV 25 mg (04/07/21 1034)  ? promethazine (PHENERGAN) injection (IM or IVPB) Stopped (04/04/21 2339)  ? ? chlorhexidine  15 mL Mouth Rinse BID  ? Chlorhexidine Gluconate Cloth  6 each Topical Q0600  ? darbepoetin (ARANESP) injection - NON-DIALYSIS  200 mcg Subcutaneous Q Thu-1800  ? heparin  5,000 Units Subcutaneous Q8H  ? mouth rinse  15 mL Mouth Rinse q12n4p  ? pantoprazole  40 mg Oral Daily  ? ?Dialysis Orders: ?TTS [facility] ? 4h  400/600 67.5kg  2/2 bath  R fem TDC   ?-Heparin 2900 units IV TIW ? - hectorol 1 ug tiw ?  ?Assessment/Plan: ?1. Small Bowel Obstruction: Likely from adhesions from previous CAPD v. pancreatitis ileus picture. Has resolved but c/o abd pain today. GI taking for EGD tomorrow. Per primary/CCS.  ?2. H/O Pancreatitis-Lipase trending down (678 -> 45 today). No evidence of pancreatitis with imaging. NO BACLOFEN IN ESRD PATIENT.  ?3.  ESRD: HD TTS - Plan for dialysis today.  ?4. HTN/volume: BP controlled, no edema. ?4. Anemia of Chronic Kidney disease: HGB down to 8.1 today. No active bleeding reported. Rec'd Aranesp 200 mcg SQ 03/31/2021. Follow HGB.  ?5. Secondary hyperparathyroidism: Corrected Ca okay, no recent PO4. Binders on hold until cleared for solid diet. Hold VRDA.  ?6. Nutrition: Diet being advanced by primary.  ? ?Chemical engineer, PA-S2 ? ?Seen with PA-S, as above. Agree with assessment/plan. ?Sharan Mcenaney H. Hamda Klutts NP-C ?04/07/2021, 10:54 AM  ?[facility] ?[phone] ?

## 2021-04-07 NOTE — Progress Notes (Signed)
?                                  PROGRESS NOTE                                             ?                                                                                                                     ?                                         ? ? Patient Demographics:  ? ? Nathan Castaneda, is a 46 y.o. male, DOB - [birth year], JYN:[id number] ? ?Outpatient Primary MD for the patient is Justin Mend, MD    LOS - 7  Admit date - 03/31/2021   ? ?No chief complaint on file. ?    ? ?Brief Narrative (HPI from H&P)  - 46 y.o. African-American male with medical history significant for essential hypertension, lupus, end-stage renal disease on hemodialysis on TTS, previously on peritoneal dialysis, pancreatitis, who presented to the ER at [facility] with acute onset diffuse abdominal pain with associated nausea and vomiting for a day, found to have SBO. ? ? Subjective:  ? ?Patient in bed, appears comfortable, denies any headache, no fever, no chest pain or pressure, no shortness of breath , =ve hiccups and abdominal pain. No new focal weakness. ? ? ? Assessment  & Plan :  ? ? ?Small bowel obstruction most likely partial in the setting of acute pancreatitis.  Being followed by general surgery pancreatitis has improved, is having some bowel activity, NG tube was removed by general surgery on 04/02/2021.  Per general surgery SBO has resolved will advance clear liquid diet to soft diet and monitor. ? ?2. ESRD - Lupus -on HD, nephrology following. TTS,  Continue to monitor. ? ?3. H/O Pancreatitis - remote history of alcohol abuse but none in the last year, LFTs stable, right upper quadrant ultrasound shows stable CBD, stable triglycerides, IgG4 levels are stable.  Lipase levels trending down, unfortunately he still continues to have some epigastric discomfort off and on and feels nauseated, GI saw the patient looks like pancreatitis has clinically resolved.  He was also having some  hiccups which is getting PRN Thorazine, per GI EGD on 04/08/2021. ? ?4. GERD - PPI. ? ?   ? ?Condition - Fair ? ?Family Communication  :  None present ? ?Code Status :  Full ? ?Consults  :  CCS, Renal, GI ? ?PUD Prophylaxis :  PPI ? ? Procedures  :    ? ? ? ?   ? ?Disposition Plan  :   ? ?  Status is: Inpatient ? ? ?DVT Prophylaxis  :   ? ?heparin injection 5,000 Units Start: 03/31/21 0600 ? ?Lab Results  ?Component Value Date  ? PLT PLATELET CLUMPS NOTED ON SMEAR, UNABLE TO ESTIMATE 04/07/2021  ? ? ?Diet :  ?Diet Order   ? ?       ?  Diet NPO time specified  Diet effective midnight       ?  ?  DIET SOFT Room service appropriate? Yes; Fluid consistency: Nectar Thick; Fluid restriction: 1200 mL Fluid  Diet effective now       ?  ? ?  ?  ? ?  ?  ? ?Inpatient Medications ? ?Scheduled Meds: ? chlorhexidine  15 mL Mouth Rinse BID  ? Chlorhexidine Gluconate Cloth  6 each Topical Q0600  ? darbepoetin (ARANESP) injection - NON-DIALYSIS  200 mcg Subcutaneous Q Thu-1800  ? heparin  5,000 Units Subcutaneous Q8H  ? mouth rinse  15 mL Mouth Rinse q12n4p  ? pantoprazole  40 mg Oral Daily  ? ?Continuous Infusions: ? chlorproMAZINE (THORAZINE) IV 25 mg (04/07/21 1034)  ? promethazine (PHENERGAN) injection (IM or IVPB) Stopped (04/04/21 2339)  ? ?PRN Meds:.acetaminophen **OR** acetaminophen, chlorproMAZINE (THORAZINE) IV, HYDROmorphone (DILAUDID) injection, ondansetron **OR** ondansetron (ZOFRAN) IV, promethazine (PHENERGAN) injection (IM or IVPB), traZODone ? ?Antibiotics  :   ? ?Anti-infectives (From admission, onward)  ? ? None  ? ?  ? ? ? Time Spent in minutes  30 ? ? ?Lala Lund M.D on 04/07/2021 at 10:47 AM ? ?To page go to [email or web address]  ? ?[facility] -  Office  [phone] ? ?See all Orders from today for further details ? ? ? Objective:  ? ?Vitals:  ? 04/06/21 1953 04/06/21 2315 04/07/21 0320 04/07/21 0805  ?BP: 102/68 100/72 106/71 102/63  ?Pulse: 77 72 76 75  ?Resp: 18 18 18 17   ?Temp: 98.8 ?F (37.1 ?C) 97.8 ?F  (36.6 ?C) 97.8 ?F (36.6 ?C) 98.7 ?F (37.1 ?C)  ?TempSrc: Oral Oral Oral Oral  ?SpO2:    100%  ?Weight:      ?Height:      ? ? ?Wt Readings from Last 3 Encounters:  ?04/05/21 66.2 kg  ?12/15/20 76 kg  ?10/20/19 75.3 kg  ? ? ? ?Intake/Output Summary (Last 24 hours) at 04/07/2021 1047 ?Last data filed at 04/06/2021 2034 ?Gross per 24 hour  ?Intake 375.08 ml  ?Output --  ?Net 375.08 ml  ? ? ? ?Physical Exam ? ?Awake Alert, No new F.N deficits, Normal affect ?[place].AT,PERRAL ?Supple Neck, No JVD,   ?Symmetrical Chest wall movement, Good air movement bilaterally, CTAB ?RRR,No Gallops, Rubs or new Murmurs,  ?+ve B.Sounds, Abd Soft, No tenderness,   ?No Cyanosis, Clubbing or edema  ? ? ? Data Review:  ? ? ?CBC ?Recent Labs  ?Lab 04/03/21 ?0304 04/04/21 ?0719 04/05/21 ?8315 04/06/21 ?0327 04/07/21 ?0159  ?WBC 5.1 5.4 4.8 5.3 5.6  ?HGB 7.8* 9.8* 9.1* 7.8* 8.1*  ?HCT 23.6* 30.3* 27.2* 23.9* 25.6*  ?PLT 135* 180 186 157 PLATELET CLUMPS NOTED ON SMEAR, UNABLE TO ESTIMATE  ?MCV 87.1 88.6 85.0 89.8 86.2  ?MCH 28.8 28.7 28.4 29.3 27.3  ?MCHC 33.1 32.3 33.5 32.6 31.6  ?RDW 18.1* 19.1* 18.1* 19.8* 18.3*  ?LYMPHSABS 2.0 0.8 1.8 2.1 2.0  ?MONOABS 0.4 0.3 0.5 0.6 0.5  ?EOSABS 0.1 0.0 0.0 0.1 0.1  ?BASOSABS 0.0 0.0 0.0 0.0 0.0  ? ? ?Electrolytes ?Recent Labs  ?Lab 04/03/21 ?0304 04/04/21 ?0719 04/05/21 ?0240 04/06/21 ?0327 04/07/21 ?  0159  ?NA 133* 135 135 136 138  ?K 3.7 4.2 5.5* 4.1 4.2  ?CL 98 96* 96* 99 100  ?CO2 27 27 22 26 26   ?GLUCOSE 84 114* 82 83 84  ?BUN 23* 36* 49* 28* 38*  ?CREATININE 8.17* 11.42* 13.23* 8.99* 11.43*  ?CALCIUM 8.1* 9.1 8.6* 8.5* 9.1  ?AST 20 18 14* 11* 14*  ?ALT 15 16 11 10 9   ?ALKPHOS 73 78 67 62 66  ?BILITOT 0.7 0.9 1.1 0.9 0.6  ?ALBUMIN 2.4* 2.7* 2.6* 2.4* 2.5*  ?MG 1.7 1.8 1.7 1.8 2.0  ? ? ? ? ?Radiology Reports ?DG Abd Portable 1V ? ?Result Date: 04/05/2021 ?CLINICAL DATA:  Abdominal pain EXAM: PORTABLE ABDOMEN - 1 VIEW COMPARISON:  04/04/2021 FINDINGS: Nonobstructive pattern of bowel gas with gas-filled,  nondistended loops of small bowel in the central abdomen and gas present to the descending colon. No radio-opaque calculi or other significant radiographic abnormality are seen. Partially imaged large-bore right femoral multi lumen vascular catheter. IMPRESSION: Nonobstructive pattern of bowel gas with gas-filled, nondistended loops of small bowel in the central abdomen and gas present to the descending colon. No free air on supine radiographs. Electronically Signed   By: Delanna Ahmadi M.D.   On: 04/05/2021 14:28  ? ?DG Abd Portable 1V ? ?Result Date: 04/04/2021 ?CLINICAL DATA:  Encounter for nausea and vomiting EXAM: PORTABLE ABDOMEN - 1 VIEW COMPARISON:  KUB from yesterday FINDINGS: Continued gas distended small bowel loops in the left abdomen. Enteric contrast is seen in nondilated colon. Dialysis catheter traversing the IVC, tip not visualized. IMPRESSION: No progression of mild small bowel dilatation. Electronically Signed   By: Jorje Guild M.D.   On: 04/04/2021 06:47    ? ? ?

## 2021-04-08 ENCOUNTER — Encounter (HOSPITAL_COMMUNITY)
Admission: AD | Disposition: A | Discharge status: Home / Self Care | Payer: Self-pay | Source: Other Acute Inpatient Hospital | Attending: Internal Medicine

## 2021-04-08 ENCOUNTER — Inpatient Hospital Stay (HOSPITAL_COMMUNITY): Payer: Medicare Other | Admitting: Anesthesiology

## 2021-04-08 DIAGNOSIS — I120 Hypertensive chronic kidney disease with stage 5 chronic kidney disease or end stage renal disease: Secondary | ICD-10-CM

## 2021-04-08 DIAGNOSIS — N186 End stage renal disease: Secondary | ICD-10-CM

## 2021-04-08 DIAGNOSIS — K2100 Gastro-esophageal reflux disease with esophagitis, without bleeding: Secondary | ICD-10-CM

## 2021-04-08 DIAGNOSIS — Z992 Dependence on renal dialysis: Secondary | ICD-10-CM

## 2021-04-08 DIAGNOSIS — K449 Diaphragmatic hernia without obstruction or gangrene: Secondary | ICD-10-CM

## 2021-04-08 HISTORY — PX: ESOPHAGOGASTRODUODENOSCOPY (EGD) WITH PROPOFOL: SHX5813

## 2021-04-08 LAB — CBC WITH DIFFERENTIAL/PLATELET
Abs Immature Granulocytes: 0.03 10*3/uL (ref 0.00–0.07)
Basophils Absolute: 0 10*3/uL (ref 0.0–0.1)
Basophils Relative: 0 %
Eosinophils Absolute: 0.1 10*3/uL (ref 0.0–0.5)
Eosinophils Relative: 1 %
HCT: 22.4 % — ABNORMAL LOW (ref 39.0–52.0)
Hemoglobin: 7.2 g/dL — ABNORMAL LOW (ref 13.0–17.0)
Immature Granulocytes: 1 %
Lymphocytes Relative: 30 %
Lymphs Abs: 1.5 10*3/uL (ref 0.7–4.0)
MCH: 28.2 pg (ref 26.0–34.0)
MCHC: 32.1 g/dL (ref 30.0–36.0)
MCV: 87.8 fL (ref 80.0–100.0)
Monocytes Absolute: 0.6 10*3/uL (ref 0.1–1.0)
Monocytes Relative: 11 %
Neutro Abs: 2.8 10*3/uL (ref 1.7–7.7)
Neutrophils Relative %: 57 %
Platelets: 141 10*3/uL — ABNORMAL LOW (ref 150–400)
RBC: 2.55 MIL/uL — ABNORMAL LOW (ref 4.22–5.81)
RDW: 18.1 % — ABNORMAL HIGH (ref 11.5–15.5)
WBC: 5 10*3/uL (ref 4.0–10.5)
nRBC: 0 % (ref 0.0–0.2)

## 2021-04-08 LAB — COMPREHENSIVE METABOLIC PANEL
ALT: 9 U/L (ref 0–44)
AST: 15 U/L (ref 15–41)
Albumin: 2.5 g/dL — ABNORMAL LOW (ref 3.5–5.0)
Alkaline Phosphatase: 70 U/L (ref 38–126)
Anion gap: 11 (ref 5–15)
BUN: 12 mg/dL (ref 6–20)
CO2: 27 mmol/L (ref 22–32)
Calcium: 8.3 mg/dL — ABNORMAL LOW (ref 8.9–10.3)
Chloride: 97 mmol/L — ABNORMAL LOW (ref 98–111)
Creatinine, Ser: 6.27 mg/dL — ABNORMAL HIGH (ref 0.61–1.24)
GFR, Estimated: 11 mL/min — ABNORMAL LOW (ref >60)
Glucose, Bld: 89 mg/dL (ref 70–99)
Potassium: 3.3 mmol/L — ABNORMAL LOW (ref 3.5–5.1)
Sodium: 135 mmol/L (ref 135–145)
Total Bilirubin: 0.5 mg/dL (ref 0.3–1.2)
Total Protein: 7.2 g/dL (ref 6.5–8.1)

## 2021-04-08 LAB — MAGNESIUM: Magnesium: 1.8 mg/dL (ref 1.7–2.4)

## 2021-04-08 LAB — LIPASE, BLOOD: Lipase: 109 U/L — ABNORMAL HIGH (ref 11–51)

## 2021-04-08 SURGERY — ESOPHAGOGASTRODUODENOSCOPY (EGD) WITH PROPOFOL
Anesthesia: Monitor Anesthesia Care

## 2021-04-08 MED ORDER — LIDOCAINE 2% (20 MG/ML) 5 ML SYRINGE
INTRAMUSCULAR | Status: DC | PRN
  Administered 2021-04-08: 80 mg via INTRAVENOUS
  Administered 2021-04-08: 20 mg via INTRAVENOUS

## 2021-04-08 MED ORDER — PHENYLEPHRINE HCL-NACL 20-0.9 MG/250ML-% IV SOLN
INTRAVENOUS | Status: DC | PRN
  Administered 2021-04-08: 80 ug/min via INTRAVENOUS

## 2021-04-08 MED ORDER — SODIUM CHLORIDE 0.9% IV SOLUTION: Freq: Once | INTRAVENOUS | Status: DC

## 2021-04-08 MED ORDER — PROPOFOL 10 MG/ML IV BOLUS
INTRAVENOUS | Status: DC | PRN
  Administered 2021-04-08: 30 mg via INTRAVENOUS

## 2021-04-08 MED ORDER — SODIUM CHLORIDE 0.9 % IV SOLN: INTRAVENOUS | Status: DC

## 2021-04-08 MED ORDER — PHENYLEPHRINE 40 MCG/ML (10ML) SYRINGE FOR IV PUSH (FOR BLOOD PRESSURE SUPPORT)
PREFILLED_SYRINGE | INTRAVENOUS | Status: DC | PRN
  Administered 2021-04-08: 40 ug via INTRAVENOUS
  Administered 2021-04-08: 80 ug via INTRAVENOUS

## 2021-04-08 MED ORDER — PROPOFOL 500 MG/50ML IV EMUL
INTRAVENOUS | Status: DC | PRN
  Administered 2021-04-08: 50 ug/kg/min via INTRAVENOUS

## 2021-04-08 MED ORDER — EPHEDRINE SULFATE-NACL 50-0.9 MG/10ML-% IV SOSY
PREFILLED_SYRINGE | INTRAVENOUS | Status: DC | PRN
  Administered 2021-04-08: 10 mg via INTRAVENOUS

## 2021-04-08 SURGICAL SUPPLY — 15 items

## 2021-04-08 NOTE — Op Note (Signed)
[facility] ?Patient Name: Nathan Castaneda ?Procedure Date : 04/08/2021 ?MRN: [id number] ?Attending MD: Carol Ada , MD ?Date of Birth: November 10, 1975 ?CSN: [id number] ?Age: 46 ?Admit Type: Inpatient ?Procedure:                Upper GI endoscopy ?Indications:              Epigastric abdominal pain ?Providers:                Carol Ada, MD, Jaci Carrel, RN, Primitivo Gauze  ?                          [place], Technician, [employer],  ?                          Technician ?Referring MD:              ?Medicines:                 ?Complications:            No immediate complications. ?Estimated Blood Loss:     Estimated blood loss: none. ?Procedure:                Pre-Anesthesia Assessment: ?                          - Prior to the procedure, a History and Physical  ?                          was performed, and patient medications and  ?                          allergies were reviewed. The patient's tolerance of  ?                          previous anesthesia was also reviewed. The risks  ?                          and benefits of the procedure and the sedation  ?                          options and risks were discussed with the patient.  ?                          All questions were answered, and informed consent  ?                          was obtained. Prior Anticoagulants: The patient has  ?                          taken no previous anticoagulant or antiplatelet  ?                          agents. ASA Grade Assessment: III - A patient with  ?                          severe systemic disease. After  reviewing the risks  ?                          and benefits, the patient was deemed in  ?                          satisfactory condition to undergo the procedure. ?                          - Sedation was administered by an anesthesia  ?                          professional. Deep sedation was attained. ?                          After obtaining informed consent, the endoscope was  ?                           passed under direct vision. Throughout the  ?                          procedure, the patient's blood pressure, pulse, and  ?                          oxygen saturations were monitored continuously. The  ?                          [id number]-[id number] ([id number]) Olympus endoscope was introduced  ?                          through the mouth, and advanced to the second part  ?                          of duodenum. The upper GI endoscopy was  ?                          accomplished without difficulty. The patient  ?                          tolerated the procedure well. ?Scope In: ?Scope Out: ?Findings: ?     A minimal esophagitis with no bleeding was found at the gastroesophageal  ?     junction. There was some friability that was noted with scope contact of  ?     the Z-line. ?     A 3 cm hiatal hernia was present. ?     The stomach was normal. ?     The examined duodenum was normal. ?     During the procedure the patient was noted to have very mild hiccups  ?     when he was sedated. This argues for an organic source rather than a  ?     psychosomatic etiology. ?Impression:               - Non-severe reflux esophagitis with no bleeding. ?                          -  3 cm hiatal hernia. ?                          - Normal stomach. ?                          - Normal examined duodenum. ?                          - No specimens collected. ?Recommendation:           - Return patient to hospital ward for ongoing care. ?                          - Resume regular diet. ?                          - Continue present medications. ?                          - PPI QD. ?                          - Follow up with PCP. ?                          - Signing off. ?Procedure Code(s):        --- Professional --- ?                          [phone], Esophagogastroduodenoscopy, flexible,  ?                          transoral; diagnostic, including collection of  ?                          specimen(s) by brushing or washing, when performed  ?                           (separate procedure) ?Diagnosis Code(s):        --- Professional --- ?                          K21.00, Gastro-esophageal reflux disease with  ?                          esophagitis, without bleeding ?                          K44.9, Diaphragmatic hernia without obstruction or  ?                          gangrene ?                          R10.13, Epigastric pain ?CPT copyright 2019 [employer]. All rights reserved. ?The codes documented in this report are preliminary and upon coder review may  ?be revised to meet current compliance requirements. ?Carol Ada, MD ?Carol Ada, MD ?04/08/2021 1:56:04 PM ?  This report has been signed electronically. ?Number of Addenda: 0 ?

## 2021-04-08 NOTE — Progress Notes (Signed)
?                                  PROGRESS NOTE                                             ?                                                                                                                     ?                                         ? ? Patient Demographics:  ? ? Nathan Castaneda, is a 46 y.o. male, DOB - March 02, 1975, [id number] ? ?Outpatient Primary MD for the patient is Justin Mend, MD    LOS - 8  Admit date - 03/31/2021   ? ?No chief complaint on file. ?    ? ?Brief Narrative (HPI from H&P)  - 46 y.o. African-American male with medical history significant for essential hypertension, lupus, end-stage renal disease on hemodialysis on TTS, previously on peritoneal dialysis, pancreatitis, who presented to the ER at [facility] with acute onset diffuse abdominal pain with associated nausea and vomiting for a day, found to have SBO. ? ? Subjective:  ? ?Patient in bed, appears comfortable, denies any headache, no fever, no chest pain or pressure, no shortness of breath ,improved abdominal pain. No new focal weakness. ? ? Assessment  & Plan :  ? ? ?Small bowel obstruction most likely partial in the setting of acute pancreatitis.  Being followed by general surgery pancreatitis has improved, is having some bowel activity, NG tube was removed by general surgery on 04/02/2021.  Per general surgery SBO has resolved will advance clear liquid diet to soft diet and monitor. ? ?2. ESRD - Lupus -on HD, nephrology following. TTS,  Continue to monitor. ? ?3. H/O Pancreatitis - remote history of alcohol abuse but none in the last year, LFTs stable, right upper quadrant ultrasound shows stable CBD, stable triglycerides, IgG4 levels are stable.  Lipase levels trending down, unfortunately he still continues to have some epigastric discomfort off and on and feels nauseated, GI saw the patient looks like pancreatitis has clinically resolved.  He was also having some hiccups  which is getting PRN Thorazine, per GI EGD on 04/08/2021. ? ?4. GERD - PPI. ? ?   ? ?Condition - Fair ? ?Family Communication  :  None present ? ?Code Status :  Full ? ?Consults  :  CCS, Renal, GI ? ?PUD Prophylaxis :  PPI ? ? Procedures  :    ? ?EGD - due 04/08/21 ? ?   ? ?Disposition Plan  :   ? ?  Status is: Inpatient ? ? ?DVT Prophylaxis  :   ? ?heparin injection 5,000 Units Start: 03/31/21 0600 ? ?Lab Results  ?Component Value Date  ? PLT 141 (L) 04/08/2021  ? ? ?Diet :  ?Diet Order   ? ?       ?  Diet NPO time specified  Diet effective midnight       ?  ? ?  ?  ? ?  ?  ? ?Inpatient Medications ? ?Scheduled Meds: ? chlorhexidine  15 mL Mouth Rinse BID  ? Chlorhexidine Gluconate Cloth  6 each Topical Q0600  ? darbepoetin (ARANESP) injection - NON-DIALYSIS  200 mcg Subcutaneous Q Thu-1800  ? heparin  5,000 Units Subcutaneous Q8H  ? mouth rinse  15 mL Mouth Rinse q12n4p  ? pantoprazole  40 mg Oral Daily  ? ?Continuous Infusions: ? chlorproMAZINE (THORAZINE) IV 25 mg (04/07/21 1034)  ? promethazine (PHENERGAN) injection (IM or IVPB) Stopped (04/04/21 2339)  ? ?PRN Meds:.acetaminophen **OR** acetaminophen, chlorproMAZINE (THORAZINE) IV, HYDROmorphone (DILAUDID) injection, ondansetron **OR** ondansetron (ZOFRAN) IV, promethazine (PHENERGAN) injection (IM or IVPB), traZODone ? ?Antibiotics  :   ? ?Anti-infectives (From admission, onward)  ? ? None  ? ?  ? ? ? Time Spent in minutes  30 ? ? ?Lala Lund M.D on 04/08/2021 at 9:59 AM ? ?To page go to [email or web address]  ? ?[facility] -  Office  [phone] ? ?See all Orders from today for further details ? ? ? Objective:  ? ?Vitals:  ? 04/07/21 1948 04/07/21 1952 04/08/21 0002 04/08/21 0328  ?BP: 108/74 125/78 108/70 111/77  ?Pulse: 93 84 74 80  ?Resp: 18 18 18 18   ?Temp: 99 ?F (37.2 ?C) 98.6 ?F (37 ?C) 98.6 ?F (37 ?C) 98.5 ?F (36.9 ?C)  ?TempSrc: Oral Oral Oral Oral  ?SpO2:      ?Weight:      ?Height:      ? ? ?Wt Readings from Last 3 Encounters:  ?04/07/21 62.6 kg   ?12/15/20 76 kg  ?10/20/19 75.3 kg  ? ? ? ?Intake/Output Summary (Last 24 hours) at 04/08/2021 0959 ?Last data filed at 04/07/2021 1810 ?Gross per 24 hour  ?Intake 240 ml  ?Output 1531 ml  ?Net -1291 ml  ? ? ? ?Physical Exam ? ?Awake Alert, No new F.N deficits, Normal affect ?[place].AT,PERRAL ?Supple Neck, No JVD,   ?Symmetrical Chest wall movement, Good air movement bilaterally, CTAB ?RRR,No Gallops, Rubs or new Murmurs,  ?+ve B.Sounds, Abd Soft, No tenderness,   ?No Cyanosis, Clubbing or edema  ? ? ? Data Review:  ? ? ?CBC ?Recent Labs  ?Lab 04/04/21 ?0719 04/05/21 ?6203 04/06/21 ?0327 04/07/21 ?0159 04/08/21 ?5597  ?WBC 5.4 4.8 5.3 5.6 5.0  ?HGB 9.8* 9.1* 7.8* 8.1* 7.2*  ?HCT 30.3* 27.2* 23.9* 25.6* 22.4*  ?PLT 180 186 157 PLATELET CLUMPS NOTED ON SMEAR, UNABLE TO ESTIMATE 141*  ?MCV 88.6 85.0 89.8 86.2 87.8  ?MCH 28.7 28.4 29.3 27.3 28.2  ?MCHC 32.3 33.5 32.6 31.6 32.1  ?RDW 19.1* 18.1* 19.8* 18.3* 18.1*  ?LYMPHSABS 0.8 1.8 2.1 2.0 1.5  ?MONOABS 0.3 0.5 0.6 0.5 0.6  ?EOSABS 0.0 0.0 0.1 0.1 0.1  ?BASOSABS 0.0 0.0 0.0 0.0 0.0  ? ? ?Electrolytes ?Recent Labs  ?Lab 04/04/21 ?0719 04/05/21 ?4163 04/06/21 ?0327 04/07/21 ?0159 04/08/21 ?8453  ?NA 135 135 136 138 135  ?K 4.2 5.5* 4.1 4.2 3.3*  ?CL 96* 96* 99 100 97*  ?CO2 27 22 26 26 27   ?GLUCOSE 114* 82 83  84 89  ?BUN 36* 49* 28* 38* 12  ?CREATININE 11.42* 13.23* 8.99* 11.43* 6.27*  ?CALCIUM 9.1 8.6* 8.5* 9.1 8.3*  ?AST 18 14* 11* 14* 15  ?ALT 16 11 10 9 9   ?ALKPHOS 78 67 62 66 70  ?BILITOT 0.9 1.1 0.9 0.6 0.5  ?ALBUMIN 2.7* 2.6* 2.4* 2.5* 2.5*  ?MG 1.8 1.7 1.8 2.0 1.8  ? ? ? ? ?Radiology Reports ?DG Abd 1 View ? ?Result Date: 04/07/2021 ?CLINICAL DATA:  Vomiting.  Concern for small bowel obstruction. EXAM: ABDOMEN - 1 VIEW COMPARISON:  Abdominal radiograph dated 04/05/2021. FINDINGS: Multiple normal caliber air-filled loops of small bowel, nonspecific. Clinical correlation is recommended to evaluate for possibility of enteritis. No bowel dilatation or evidence of  obstruction. No free air or radiopaque calculi. The osseous structures are intact. The soft tissues are unremarkable. Right femoral approach catheter with tip at the inferior vena and right atrium junction. IMPRESSION: No bowel dilatation. Electronically Signed   By: Anner Crete M.D.   On: 04/07/2021 23:01  ? ?DG Abd Portable 1V ? ?Result Date: 04/05/2021 ?CLINICAL DATA:  Abdominal pain EXAM: PORTABLE ABDOMEN - 1 VIEW COMPARISON:  04/04/2021 FINDINGS: Nonobstructive pattern of bowel gas with gas-filled, nondistended loops of small bowel in the central abdomen and gas present to the descending colon. No radio-opaque calculi or other significant radiographic abnormality are seen. Partially imaged large-bore right femoral multi lumen vascular catheter. IMPRESSION: Nonobstructive pattern of bowel gas with gas-filled, nondistended loops of small bowel in the central abdomen and gas present to the descending colon. No free air on supine radiographs. Electronically Signed   By: Delanna Ahmadi M.D.   On: 04/05/2021 14:28    ? ? ?

## 2021-04-08 NOTE — Transfer of Care (Signed)
Immediate Anesthesia Transfer of Care Note ? ?Patient: Nathan Castaneda ? ?Procedure(s) Performed: ESOPHAGOGASTRODUODENOSCOPY (EGD) WITH PROPOFOL ? ?Patient Location: Endoscopy Unit ? ?Anesthesia Type:MAC ? ?Level of Consciousness: drowsy ? ?Airway & Oxygen Therapy: Patient Spontanous Breathing ? ?Post-op Assessment: Report given to RN and Post -op Vital signs reviewed and stable ? ?Post vital signs: Reviewed and stable ? ?Last Vitals:  ?Vitals Value Taken Time  ?BP 91/59 04/08/21 1358  ?Temp 36.6 ?C 04/08/21 1355  ?Pulse 81 04/08/21 1358  ?Resp 20 04/08/21 1358  ?SpO2 97 % 04/08/21 1358  ?Vitals shown include unvalidated device data. ? ?Last Pain:  ?Vitals:  ? 04/08/21 1355  ?TempSrc: Temporal  ?PainSc: Asleep  ?   ? ?Patients Stated Pain Goal: 3 (04/08/21 0400) ? ?Complications: No notable events documented. ?

## 2021-04-08 NOTE — Anesthesia Procedure Notes (Signed)
Procedure Name: Iago ?Date/Time: 04/08/2021 1:37 PM ?Performed by: Lorie Phenix, CRNA ?Pre-anesthesia Checklist: Patient identified, Emergency Drugs available, Suction available and Patient being monitored ?Patient Re-evaluated:Patient Re-evaluated prior to induction ?Oxygen Delivery Method: Nasal cannula ?Placement Confirmation: positive ETCO2 ? ? ? ? ?

## 2021-04-08 NOTE — Progress Notes (Signed)
Patient refused heparin, educated on importance. Says he is willing to take it once a day but not 3 times a day - Dr. Candiss Norse notified. ?

## 2021-04-08 NOTE — Anesthesia Preprocedure Evaluation (Addendum)
Anesthesia Evaluation  ?Patient identified by MRN, date of birth, ID band ?Patient awake ? ? ? ?Reviewed: ?Allergy & Precautions, NPO status , Patient's Chart, lab work & pertinent test results ? ?Airway ?Mallampati: I ? ?TM Distance: >3 FB ?Neck ROM: Full ? ? ? Dental ?no notable dental hx. ?(+) Teeth Intact, Dental Advisory Given, Poor Dentition, Chipped, Missing,  ?  ?Pulmonary ?Current SmokerPatient did not abstain from smoking.,  ?  ?Pulmonary exam normal ?breath sounds clear to auscultation ? ? ? ? ? ? Cardiovascular ?hypertension, Pt. on medications and Pt. on home beta blockers ?Normal cardiovascular exam ?Rhythm:Regular Rate:Normal ? ? ?  ?Neuro/Psych ? Headaches, negative psych ROS  ? GI/Hepatic ?negative GI ROS, Neg liver ROS,   ?Endo/Other  ?negative endocrine ROS ? Renal/GU ?ESRF and DialysisRenal diseasePeritoneal dialysis daily ? ?K+ 4.1 ?Cr 17.5  ? ?  ?Musculoskeletal ? ? Abdominal ?  ?Peds ? Hematology ? ?(+) Blood dyscrasia, anemia , Hgb 9.5   ?Anesthesia Other Findings ? ? Reproductive/Obstetrics ? ?  ? ? ? ? ? ? ? ? ? ? ? ? ? ?  ?  ? ? ? ? ? ? ? ?Anesthesia Physical ? ?Anesthesia Plan ? ?ASA: 3 ? ?Anesthesia Plan: MAC  ? ?Post-op Pain Management: Minimal or no pain anticipated  ? ?Induction: Intravenous ? ?PONV Risk Score and Plan: 3 and Treatment may vary due to age or medical condition, Ondansetron and Dexamethasone ? ?Airway Management Planned: Nasal Cannula, Natural Airway and Simple Face Mask ? ?Additional Equipment: None ? ?Intra-op Plan:  ? ?Post-operative Plan:  ? ?Informed Consent: I have reviewed the patients History and Physical, chart, labs and discussed the procedure including the risks, benefits and alternatives for the proposed anesthesia with the patient or authorized representative who has indicated his/her understanding and acceptance.  ? ? ? ?Dental advisory given ? ?Plan Discussed with: Anesthesiologist and CRNA ? ?Anesthesia Plan Comments:  Nathan Castaneda is a 46 y.o. African-American male with medical history significant for essential hypertension, lupus, end-stage renal disease on hemodialysis on TTS, previously on peritoneal dialysis, pancreatitis, who presented to the ER at [facility] with acute onset diffuse abdominal pain with associated nausea and vomiting for a day. )  ? ? ? ? ? ? ?Anesthesia Quick Evaluation ? ?

## 2021-04-08 NOTE — Interval H&P Note (Signed)
History and Physical Interval Note: ? ?04/08/2021 ?1:28 PM ? ?Nathan Castaneda  has presented today for surgery, with the diagnosis of Epigastric pain.  The various methods of treatment have been discussed with the patient and family. After consideration of risks, benefits and other options for treatment, the patient has consented to  Procedure(s): ?ESOPHAGOGASTRODUODENOSCOPY (EGD) WITH PROPOFOL (N/A) as a surgical intervention.  The patient's history has been reviewed, patient examined, no change in status, stable for surgery.  I have reviewed the patient's chart and labs.  Questions were answered to the patient's satisfaction.   ? ? ?Fintan Grater D ? ? ?

## 2021-04-08 NOTE — Progress Notes (Signed)
?[facility] ?Progress Note  ? ?Subjective:    ?Patient seen in room, lying in bed comfortably. Going for EGD with GI today. Last dialyzed on 3/30 (UF 1.5L). Denies N/V, abd pain, hiccups.  ? ?Objective ?Vitals:  ? 04/07/21 1948 04/07/21 1952 04/08/21 0002 04/08/21 0328  ?BP: 108/74 125/78 108/70 111/77  ?Pulse: 93 84 74 80  ?Resp: 18 18 18 18   ?Temp: 99 ?F (37.2 ?C) 98.6 ?F (37 ?C) 98.6 ?F (37 ?C) 98.5 ?F (36.9 ?C)  ?TempSrc: Oral Oral Oral Oral  ?SpO2:      ?Weight:      ?Height:      ? ?Physical Exam ?General: Chronically ill appearing male NAD,  ?Heart: S1,S2 RRR ?Lungs: CTAB ?Abdomen: NABS, mildly tender on lower quadrants ?Extremities:No LE edema ?Dialysis Access: L femoral TDC drsg intact  ? ?Additional Objective ?Labs: ?Basic Metabolic Panel: ?Recent Labs  ?Lab 04/06/21 ?0327 04/07/21 ?0159 04/08/21 ?2831  ?NA 136 138 135  ?K 4.1 4.2 3.3*  ?CL 99 100 97*  ?CO2 26 26 27   ?GLUCOSE 83 84 89  ?BUN 28* 38* 12  ?CREATININE 8.99* 11.43* 6.27*  ?CALCIUM 8.5* 9.1 8.3*  ? ?Liver Function Tests: ?Recent Labs  ?Lab 04/06/21 ?0327 04/07/21 ?0159 04/08/21 ?5176  ?AST 11* 14* 15  ?ALT 10 9 9   ?ALKPHOS 62 66 70  ?BILITOT 0.9 0.6 0.5  ?PROT 6.8 7.1 7.2  ?ALBUMIN 2.4* 2.5* 2.5*  ? ?Recent Labs  ?Lab 04/06/21 ?0327 04/07/21 ?0159 04/08/21 ?1607  ?LIPASE 25 58* 109*  ? ?CBC: ?Recent Labs  ?Lab 04/04/21 ?0719 04/05/21 ?3710 04/06/21 ?0327 04/07/21 ?0159 04/08/21 ?6269  ?WBC 5.4 4.8 5.3 5.6 5.0  ?NEUTROABS 4.2 2.4 2.5 3.0 2.8  ?HGB 9.8* 9.1* 7.8* 8.1* 7.2*  ?HCT 30.3* 27.2* 23.9* 25.6* 22.4*  ?MCV 88.6 85.0 89.8 86.2 87.8  ?PLT 180 186 157 PLATELET CLUMPS NOTED ON SMEAR, UNABLE TO ESTIMATE 141*  ? ?Blood Culture ?No results found for: SDES, [place], [place], REPTSTATUS ? ?Cardiac Enzymes: ?No results for input(s): CKTOTAL, CKMB, CKMBINDEX, TROPONINI in the last 168 hours. ?CBG: ?No results for input(s): GLUCAP in the last 168 hours. ?Iron Studies: No results for input(s): IRON, TIBC, TRANSFERRIN, FERRITIN in the  last 72 hours. ?@lablastinr3 @ ?Studies/Results: ?DG Abd 1 View ? ?Result Date: 04/07/2021 ?CLINICAL DATA:  Vomiting.  Concern for small bowel obstruction. EXAM: ABDOMEN - 1 VIEW COMPARISON:  Abdominal radiograph dated 04/05/2021. FINDINGS: Multiple normal caliber air-filled loops of small bowel, nonspecific. Clinical correlation is recommended to evaluate for possibility of enteritis. No bowel dilatation or evidence of obstruction. No free air or radiopaque calculi. The osseous structures are intact. The soft tissues are unremarkable. Right femoral approach catheter with tip at the inferior vena and right atrium junction. IMPRESSION: No bowel dilatation. Electronically Signed   By: Anner Crete M.D.   On: 04/07/2021 23:01   ?Medications: ? chlorproMAZINE (THORAZINE) IV 25 mg (04/07/21 1034)  ? promethazine (PHENERGAN) injection (IM or IVPB) Stopped (04/04/21 2339)  ? ? chlorhexidine  15 mL Mouth Rinse BID  ? Chlorhexidine Gluconate Cloth  6 each Topical Q0600  ? darbepoetin (ARANESP) injection - NON-DIALYSIS  200 mcg Subcutaneous Q Thu-1800  ? heparin  5,000 Units Subcutaneous Q8H  ? mouth rinse  15 mL Mouth Rinse q12n4p  ? pantoprazole  40 mg Oral Daily  ? ?Dialysis Orders: ?TTS [facility] ? 4h  400/600 67.5kg  2/2 bath  R fem TDC   ?-Heparin 2900 units IV TIW ? - hectorol 1  ug tiw ?  ?Assessment/Plan: ?1. Small Bowel Obstruction: Likely from adhesions from previous CAPD v. pancreatitis ileus picture. Resolved but developed abd pain again 3/30 . GI taking for EGD today, 3/31. Per primary/CCS.  ?2. H/O Pancreatitis-Lipase globally trending down but today somewhat higher (678 -> 58 --> 109 today). No evidence of pancreatitis with imaging.  ?3. ESRD: HD TTS - Next HD 04/09/2021.  ?4. HTN/volume: BP controlled, no edema. Under OP EDW. Lower on discharge. UF as tolerated.  ?4. Anemia of Chronic Kidney disease: HGB down to 7.2 today. No active bleeding reported. Rec'd Aranesp 200 mcg SQ 04/07/2021. Follow HGB. Transfuse in HD  tomorrow.  ?5. Secondary hyperparathyroidism: Corrected Ca okay, no recent PO4. Binders on hold until cleared for solid diet. Hold VRDA.  ?6. Nutrition: Diet being advanced by primary.  ?  ?Chemical engineer, PA-S2 ?  ?Seen with PA-S, as above. Agree with assessment/plan. ?Marli Diego H. Hadley Soileau NP-C ?04/08/2021, 11:12 AM  ?[facility] ?[phone] ?

## 2021-04-09 LAB — MAGNESIUM: Magnesium: 1.8 mg/dL (ref 1.7–2.4)

## 2021-04-09 LAB — COMPREHENSIVE METABOLIC PANEL
ALT: 8 U/L (ref 0–44)
AST: 16 U/L (ref 15–41)
Albumin: 2.4 g/dL — ABNORMAL LOW (ref 3.5–5.0)
Alkaline Phosphatase: 70 U/L (ref 38–126)
Anion gap: 9 (ref 5–15)
BUN: 19 mg/dL (ref 6–20)
CO2: 27 mmol/L (ref 22–32)
Calcium: 8.6 mg/dL — ABNORMAL LOW (ref 8.9–10.3)
Chloride: 101 mmol/L (ref 98–111)
Creatinine, Ser: 8.63 mg/dL — ABNORMAL HIGH (ref 0.61–1.24)
GFR, Estimated: 7 mL/min — ABNORMAL LOW (ref >60)
Glucose, Bld: 121 mg/dL — ABNORMAL HIGH (ref 70–99)
Potassium: 3.4 mmol/L — ABNORMAL LOW (ref 3.5–5.1)
Sodium: 137 mmol/L (ref 135–145)
Total Bilirubin: 0.2 mg/dL — ABNORMAL LOW (ref 0.3–1.2)
Total Protein: 6.3 g/dL — ABNORMAL LOW (ref 6.5–8.1)

## 2021-04-09 LAB — CBC WITH DIFFERENTIAL/PLATELET
Abs Immature Granulocytes: 0.06 10*3/uL (ref 0.00–0.07)
Basophils Absolute: 0 10*3/uL (ref 0.0–0.1)
Basophils Relative: 0 %
Eosinophils Absolute: 0.1 10*3/uL (ref 0.0–0.5)
Eosinophils Relative: 1 %
HCT: 21.5 % — ABNORMAL LOW (ref 39.0–52.0)
Hemoglobin: 6.8 g/dL — CL (ref 13.0–17.0)
Immature Granulocytes: 1 %
Lymphocytes Relative: 33 %
Lymphs Abs: 1.7 10*3/uL (ref 0.7–4.0)
MCH: 28.7 pg (ref 26.0–34.0)
MCHC: 31.6 g/dL (ref 30.0–36.0)
MCV: 90.7 fL (ref 80.0–100.0)
Monocytes Absolute: 0.6 10*3/uL (ref 0.1–1.0)
Monocytes Relative: 11 %
Neutro Abs: 2.7 10*3/uL (ref 1.7–7.7)
Neutrophils Relative %: 54 %
Platelets: 134 10*3/uL — ABNORMAL LOW (ref 150–400)
RBC: 2.37 MIL/uL — ABNORMAL LOW (ref 4.22–5.81)
RDW: 18.6 % — ABNORMAL HIGH (ref 11.5–15.5)
WBC: 5 10*3/uL (ref 4.0–10.5)
nRBC: 0 % (ref 0.0–0.2)

## 2021-04-09 MED ORDER — SUCRALFATE 1 GM/10ML PO SUSP
1.0000 g | Freq: Three times a day (TID) | ORAL | Status: DC
  Administered 2021-04-09: 1 g via ORAL
  Filled 2021-04-09 (×3): qty 10

## 2021-04-09 MED ORDER — HEPARIN SODIUM (PORCINE) 1000 UNIT/ML IJ SOLN
INTRAMUSCULAR | Status: AC
  Filled 2021-04-09: qty 7

## 2021-04-09 MED ORDER — SODIUM CHLORIDE 0.9% IV SOLUTION: Freq: Once | INTRAVENOUS | Status: DC

## 2021-04-09 MED ORDER — PANTOPRAZOLE SODIUM 40 MG PO TBEC
40.0000 mg | DELAYED_RELEASE_TABLET | Freq: Two times a day (BID) | ORAL | Status: DC
  Administered 2021-04-09 – 2021-04-10 (×3): 40 mg via ORAL
  Filled 2021-04-09 (×3): qty 1

## 2021-04-09 NOTE — Progress Notes (Signed)
?[facility] ?Progress Note  ? ?Subjective:   Reports he is hungry this AM, denies any other concerns. No SOB, CP, palpitations, dizziness, abdominal pain or nausea.  ? ?Objective ?Vitals:  ? 04/08/21 2100 04/09/21 0105 04/09/21 0505 04/09/21 0737  ?BP: (!) 90/53 (!) 82/55 (!) 84/61 93/70  ?Pulse: 91 97 83 75  ?Resp: 16 16 17 18   ?Temp: 98.8 ?F (37.1 ?C) 98.3 ?F (36.8 ?C) 98.2 ?F (36.8 ?C) 97.9 ?F (36.6 ?C)  ?TempSrc: Oral Oral Oral Oral  ?SpO2: 95% 93% 98% 100%  ?Weight:      ?Height:      ? ?Physical Exam ?General: Well developed, alert and in NAD ?Heart: RRR, no murmurs, rubs or gallops ?Lungs: CTA bilaterally without wheezing, rhonchi or rales ?Abdomen: Non-distended, +BS ?Extremities: No edema b/l lower extremities ?Dialysis Access: [facility] with intact dressing ? ?Additional Objective ?Labs: ?Basic Metabolic Panel: ?Recent Labs  ?Lab 04/07/21 ?0159 04/08/21 ?0204 04/09/21 ?0122  ?NA 138 135 137  ?K 4.2 3.3* 3.4*  ?CL 100 97* 101  ?CO2 26 27 27   ?GLUCOSE 84 89 121*  ?BUN 38* 12 19  ?CREATININE 11.43* 6.27* 8.63*  ?CALCIUM 9.1 8.3* 8.6*  ? ?Liver Function Tests: ?Recent Labs  ?Lab 04/07/21 ?0159 04/08/21 ?0204 04/09/21 ?0122  ?AST 14* 15 16  ?ALT 9 9 8   ?ALKPHOS 66 70 70  ?BILITOT 0.6 0.5 0.2*  ?PROT 7.1 7.2 6.3*  ?ALBUMIN 2.5* 2.5* 2.4*  ? ?Recent Labs  ?Lab 04/06/21 ?0327 04/07/21 ?0159 04/08/21 ?5643  ?LIPASE 25 58* 109*  ? ?CBC: ?Recent Labs  ?Lab 04/05/21 ?3295 04/06/21 ?0327 04/07/21 ?0159 04/08/21 ?1884 04/09/21 ?0122  ?WBC 4.8 5.3 5.6 5.0 5.0  ?NEUTROABS 2.4 2.5 3.0 2.8 2.7  ?HGB 9.1* 7.8* 8.1* 7.2* 6.8*  ?HCT 27.2* 23.9* 25.6* 22.4* 21.5*  ?MCV 85.0 89.8 86.2 87.8 90.7  ?PLT 186 157 PLATELET CLUMPS NOTED ON SMEAR, UNABLE TO ESTIMATE 141* 134*  ? ?Blood Culture ?No results found for: SDES, [place], [place], REPTSTATUS ? ?Cardiac Enzymes: ?No results for input(s): CKTOTAL, CKMB, CKMBINDEX, TROPONINI in the last 168 hours. ?CBG: ?No results for input(s): GLUCAP in the last 168 hours. ?Iron  Studies: No results for input(s): IRON, TIBC, TRANSFERRIN, FERRITIN in the last 72 hours. ?@lablastinr3 @ ?Studies/Results: ?DG Abd 1 View ? ?Result Date: 04/07/2021 ?CLINICAL DATA:  Vomiting.  Concern for small bowel obstruction. EXAM: ABDOMEN - 1 VIEW COMPARISON:  Abdominal radiograph dated 04/05/2021. FINDINGS: Multiple normal caliber air-filled loops of small bowel, nonspecific. Clinical correlation is recommended to evaluate for possibility of enteritis. No bowel dilatation or evidence of obstruction. No free air or radiopaque calculi. The osseous structures are intact. The soft tissues are unremarkable. Right femoral approach catheter with tip at the inferior vena and right atrium junction. IMPRESSION: No bowel dilatation. Electronically Signed   By: Anner Crete M.D.   On: 04/07/2021 23:01   ?Medications: ? chlorproMAZINE (THORAZINE) IV 25 mg (04/09/21 0020)  ? promethazine (PHENERGAN) injection (IM or IVPB) Stopped (04/04/21 2339)  ? ? sodium chloride   Intravenous Once  ? sodium chloride   Intravenous Once  ? chlorhexidine  15 mL Mouth Rinse BID  ? Chlorhexidine Gluconate Cloth  6 each Topical Q0600  ? darbepoetin (ARANESP) injection - NON-DIALYSIS  200 mcg Subcutaneous Q Thu-1800  ? mouth rinse  15 mL Mouth Rinse q12n4p  ? pantoprazole  40 mg Oral BID  ? sucralfate  1 g Oral TID WC & HS  ? ? ?Dialysis Orders: ?TTS [facility] ?  4h  400/600 67.5kg  2/2 bath  R fem TDC   ?-Heparin 2900 units IV TIW ? - hectorol 1 ug tiw ? ?Assessment/Plan: ?1. Small Bowel Obstruction: Likely from adhesions from previous CAPD v. pancreatitis ileus picture. Resolved but developed abd pain again 3/30 . General surgery advancing diet as tolerated ?2. H/O Pancreatitis-Lipase globally trending down but today somewhat higher. No evidence of pancreatitis with imaging. Abdominal pain resolved today ?3. ESRD: HD TTS - Next HD today, 04/09/2021. Mild hypokalemia likely due to decreased PO intake, will use 4K bath with dialysis today.  ?4.  HTN/volume: BP controlled, no edema. Under OP EDW. Lower on discharge. UF as tolerated.  ?4. Anemia of Chronic Kidney disease: HGB down to 6.8 today. No active bleeding reported. Rec'd Aranesp 200 mcg SQ 04/07/2021. Transfusing PRBC with HD today. Will hold heparin.  ?5. Secondary hyperparathyroidism: Corrected Ca okay, no recent PO4. Binders on hold until cleared for solid diet. Hold VRDA.  ?6. Nutrition: Diet being advanced by primary.  ?  ? ?Anice Paganini, PA-C ?04/09/2021, 9:40 AM  ?[facility] ?Pager: [phone] ? ? ?

## 2021-04-09 NOTE — Progress Notes (Addendum)
?                                  PROGRESS NOTE                                             ?                                                                                                                     ?                                         ? ? Patient Demographics:  ? ? Nathan Castaneda, is a 46 y.o. male, DOB - 12-22-1975, VXB:[id number] ? ?Outpatient Primary MD for the patient is Justin Mend, MD    LOS - 9  Admit date - 03/31/2021   ? ?No chief complaint on file. ?    ? ?Brief Narrative (HPI from H&P)  - 46 y.o. African-American male with medical history significant for essential hypertension, lupus, end-stage renal disease on hemodialysis on TTS, previously on peritoneal dialysis, pancreatitis, who presented to the ER at [facility] with acute onset diffuse abdominal pain with associated nausea and vomiting for a day, found to have SBO. ? ? Subjective:  ? ?Patient in bed resting comfortably denies any headache, abdominal pain and hiccups are better, no blood in stool, no black stool.  No focal weakness. ? ? Assessment  & Plan :  ? ? ?Small bowel obstruction most likely partial in the setting of acute pancreatitis.  Being followed by general surgery pancreatitis has improved, is having some bowel activity, NG tube was removed by general surgery on 04/02/2021.  Per general surgery SBO has resolved will advance clear liquid diet to soft diet and monitor. ? ?2. ESRD - Lupus -on HD, nephrology following. TTS,  Continue to monitor. ? ?3. H/O Pancreatitis - remote history of alcohol abuse but none in the last year, LFTs stable, right upper quadrant ultrasound shows stable CBD, stable triglycerides, IgG4 levels are stable.  Lipase levels trending down, unfortunately he still continues to have some epigastric discomfort off and on and feels nauseated, GI saw the patient looks like pancreatitis has clinically resolved.  He was also having some hiccups which is getting PRN  Thorazine, and by GI underwent EGD on 04/08/2021 showing mild esophagitis.  Symptoms are improving. ? ?4. GERD - PPI, increase dose to twice daily along with Carafate.  Seen by GI this admission underwent EGD showing some esophagitis. ? ?5.  Anemia of chronic disease with likely some fall due to frequent blood draws.  Type screen transfuse 1 unit of packed RBC on 04/09/2021 and monitor. ? ?   ? ?  Condition - Fair ? ?Family Communication  :  None present ? ?Code Status :  Full ? ?Consults  :  CCS, Renal, GI ? ?PUD Prophylaxis :  PPI ? ? Procedures  :    ? ?EGD -  04/08/21 -  ?Findings: ?     A minimal esophagitis with no bleeding was found at the gastroesophageal  ?     junction. There was some friability that was noted with scope contact of  ?     the Z-line. ?     A 3 cm hiatal hernia was present. ?     The stomach was normal. ?     The examined duodenum was normal. ?     During the procedure the patient was noted to have very mild hiccups  ?     when he was sedated. This argues for an organic source rather than a  ?     psychosomatic etiology. ?Impression:               - Non-severe reflux esophagitis with no bleeding. ?                          - 3 cm hiatal hernia. ?                          - Normal stomach. ?                          - Normal examined duodenum. ?                          - No specimens collected. ?Recommendation:           - Return patient to hospital ward for ongoing care. ?                          - Resume regular diet. ?                          - Continue present medications. ?                          - PPI QD. ?                          - Follow up with PCP. ? ?   ? ?Disposition Plan  :   ? ?Status is: Inpatient ? ? ?DVT Prophylaxis  :   ? ?Place and maintain sequential compression device Start: 04/09/21 0554 ? ?Lab Results  ?Component Value Date  ? PLT 134 (L) 04/09/2021  ? ? ?Diet :  ?Diet Order   ? ?       ?  Diet regular Room service appropriate? Yes; Fluid consistency: Thin  Diet  effective now       ?  ? ?  ?  ? ?  ?  ? ?Inpatient Medications ? ?Scheduled Meds: ? sodium chloride   Intravenous Once  ? sodium chloride   Intravenous Once  ? chlorhexidine  15 mL Mouth Rinse BID  ? Chlorhexidine Gluconate Cloth  6 each Topical Q0600  ? darbepoetin (ARANESP) injection - NON-DIALYSIS  200 mcg Subcutaneous Q Thu-1800  ? mouth  rinse  15 mL Mouth Rinse q12n4p  ? pantoprazole  40 mg Oral Daily  ? ?Continuous Infusions: ? chlorproMAZINE (THORAZINE) IV 25 mg (04/09/21 0020)  ? promethazine (PHENERGAN) injection (IM or IVPB) Stopped (04/04/21 2339)  ? ?PRN Meds:.acetaminophen **OR** acetaminophen, chlorproMAZINE (THORAZINE) IV, HYDROmorphone (DILAUDID) injection, ondansetron **OR** ondansetron (ZOFRAN) IV, promethazine (PHENERGAN) injection (IM or IVPB), traZODone ? ?Antibiotics  :   ? ?Anti-infectives (From admission, onward)  ? ? None  ? ?  ? ? ? Time Spent in minutes  30 ? ? ?Lala Lund M.D on 04/09/2021 at 8:47 AM ? ?To page go to [email or web address]  ? ?[facility] -  Office  [phone] ? ?See all Orders from today for further details ? ? ? Objective:  ? ?Vitals:  ? 04/08/21 2100 04/09/21 0105 04/09/21 0505 04/09/21 0737  ?BP: (!) 90/53 (!) 82/55 (!) 84/61 93/70  ?Pulse: 91 97 83 75  ?Resp: 16 16 17 18   ?Temp: 98.8 ?F (37.1 ?C) 98.3 ?F (36.8 ?C) 98.2 ?F (36.8 ?C) 97.9 ?F (36.6 ?C)  ?TempSrc: Oral Oral Oral Oral  ?SpO2: 95% 93% 98% 100%  ?Weight:      ?Height:      ? ? ?Wt Readings from Last 3 Encounters:  ?04/07/21 62.6 kg  ?12/15/20 76 kg  ?10/20/19 75.3 kg  ? ? ? ?Intake/Output Summary (Last 24 hours) at 04/09/2021 0847 ?Last data filed at 04/09/2021 0507 ?Gross per 24 hour  ?Intake 500 ml  ?Output --  ?Net 500 ml  ? ? ? ?Physical Exam ? ?Awake Alert, No new F.N deficits, Normal affect ?[place].AT,PERRAL ?Supple Neck, No JVD,   ?Symmetrical Chest wall movement, Good air movement bilaterally, CTAB ?RRR,No Gallops, Rubs or new Murmurs,  ?+ve B.Sounds, Abd Soft, No tenderness,   ?No Cyanosis, Clubbing or  edema  ? ? Data Review:  ? ? ?CBC ?Recent Labs  ?Lab 04/05/21 ?6789 04/06/21 ?0327 04/07/21 ?0159 04/08/21 ?3810 04/09/21 ?0122  ?WBC 4.8 5.3 5.6 5.0 5.0  ?HGB 9.1* 7.8* 8.1* 7.2* 6.8*  ?HCT 27.2* 23.9* 25.6* 22.4* 21.5*  ?PLT 186 157 PLATELET CLUMPS NOTED ON SMEAR, UNABLE TO ESTIMATE 141* 134*  ?MCV 85.0 89.8 86.2 87.8 90.7  ?MCH 28.4 29.3 27.3 28.2 28.7  ?MCHC 33.5 32.6 31.6 32.1 31.6  ?RDW 18.1* 19.8* 18.3* 18.1* 18.6*  ?LYMPHSABS 1.8 2.1 2.0 1.5 1.7  ?MONOABS 0.5 0.6 0.5 0.6 0.6  ?EOSABS 0.0 0.1 0.1 0.1 0.1  ?BASOSABS 0.0 0.0 0.0 0.0 0.0  ? ? ?Electrolytes ?Recent Labs  ?Lab 04/05/21 ?1751 04/06/21 ?0327 04/07/21 ?0159 04/08/21 ?0258 04/09/21 ?0122  ?NA 135 136 138 135 137  ?K 5.5* 4.1 4.2 3.3* 3.4*  ?CL 96* 99 100 97* 101  ?CO2 22 26 26 27 27   ?GLUCOSE 82 83 84 89 121*  ?BUN 49* 28* 38* 12 19  ?CREATININE 13.23* 8.99* 11.43* 6.27* 8.63*  ?CALCIUM 8.6* 8.5* 9.1 8.3* 8.6*  ?AST 14* 11* 14* 15 16  ?ALT 11 10 9 9 8   ?ALKPHOS 67 62 66 70 70  ?BILITOT 1.1 0.9 0.6 0.5 0.2*  ?ALBUMIN 2.6* 2.4* 2.5* 2.5* 2.4*  ?MG 1.7 1.8 2.0 1.8 1.8  ? ? ? ? ?Radiology Reports ?DG Abd 1 View ? ?Result Date: 04/07/2021 ?CLINICAL DATA:  Vomiting.  Concern for small bowel obstruction. EXAM: ABDOMEN - 1 VIEW COMPARISON:  Abdominal radiograph dated 04/05/2021. FINDINGS: Multiple normal caliber air-filled loops of small bowel, nonspecific. Clinical correlation is recommended to evaluate for possibility of enteritis. No bowel dilatation or  evidence of obstruction. No free air or radiopaque calculi. The osseous structures are intact. The soft tissues are unremarkable. Right femoral approach catheter with tip at the inferior vena and right atrium junction. IMPRESSION: No bowel dilatation. Electronically Signed   By: Anner Crete M.D.   On: 04/07/2021 23:01  ? ?DG Abd Portable 1V ? ?Result Date: 04/05/2021 ?CLINICAL DATA:  Abdominal pain EXAM: PORTABLE ABDOMEN - 1 VIEW COMPARISON:  04/04/2021 FINDINGS: Nonobstructive pattern of bowel gas with  gas-filled, nondistended loops of small bowel in the central abdomen and gas present to the descending colon. No radio-opaque calculi or other significant radiographic abnormality are seen. Partially imaged

## 2021-04-09 NOTE — Progress Notes (Signed)
CROSS COVER NOTE ? ? ?Nathan Castaneda ?[id number] ? ? ?S:  ? ?Nurse called. Hb is now low at 6.8. ? ?Discussed with patient at bedside. ?Pt reports has not noticed any bleeding. ?He states he has not had any coughing, coughing up of blood, or vomiting. ?Has fatigue. ? ? ? ?Today's Vitals  ? 04/08/21 2100 04/09/21 0000 04/09/21 0041 04/09/21 0105  ?BP: (!) 90/53   (!) 82/55  ?Pulse: 91   97  ?Resp: 16   16  ?Temp: 98.8 ?F (37.1 ?C)   98.3 ?F (36.8 ?C)  ?TempSrc: Oral   Oral  ?SpO2: 95%   93%  ?Weight:      ?Height:      ?PainSc:  5  Asleep   ? ?Body mass index is 19.8 kg/m?. ? ? ? ? ? ?A/P: ? ?Anemia. ?Symptomatic. ?Hb 6.8, was 7.2 yesterday and 8.1 day before. ? ?Plan: ?Discussed risks/benefits/alternatives of transfusion with patient. ?Patient consents to transfusion. ?Blood consent signed and witnessed. ? ?Transfuse 1 unit PRBCs. ?Further evaluation and treatment per daytime attending. ? ? ? ?

## 2021-04-10 ENCOUNTER — Encounter (HOSPITAL_COMMUNITY): Payer: Self-pay | Admitting: Gastroenterology

## 2021-04-10 LAB — MAGNESIUM: Magnesium: 1.7 mg/dL (ref 1.7–2.4)

## 2021-04-10 LAB — COMPREHENSIVE METABOLIC PANEL
ALT: 9 U/L (ref 0–44)
AST: 14 U/L — ABNORMAL LOW (ref 15–41)
Albumin: 2.4 g/dL — ABNORMAL LOW (ref 3.5–5.0)
Alkaline Phosphatase: 73 U/L (ref 38–126)
Anion gap: 8 (ref 5–15)
BUN: 8 mg/dL (ref 6–20)
CO2: 27 mmol/L (ref 22–32)
Calcium: 8.4 mg/dL — ABNORMAL LOW (ref 8.9–10.3)
Chloride: 102 mmol/L (ref 98–111)
Creatinine, Ser: 5.15 mg/dL — ABNORMAL HIGH (ref 0.61–1.24)
GFR, Estimated: 13 mL/min — ABNORMAL LOW (ref >60)
Glucose, Bld: 97 mg/dL (ref 70–99)
Potassium: 3.9 mmol/L (ref 3.5–5.1)
Sodium: 137 mmol/L (ref 135–145)
Total Bilirubin: 0.2 mg/dL — ABNORMAL LOW (ref 0.3–1.2)
Total Protein: 6.6 g/dL (ref 6.5–8.1)

## 2021-04-10 LAB — CBC WITH DIFFERENTIAL/PLATELET
Abs Immature Granulocytes: 0.1 10*3/uL — ABNORMAL HIGH (ref 0.00–0.07)
Basophils Absolute: 0 10*3/uL (ref 0.0–0.1)
Basophils Relative: 0 %
Eosinophils Absolute: 0.1 10*3/uL (ref 0.0–0.5)
Eosinophils Relative: 2 %
HCT: 25.5 % — ABNORMAL LOW (ref 39.0–52.0)
Hemoglobin: 8.3 g/dL — ABNORMAL LOW (ref 13.0–17.0)
Immature Granulocytes: 2 %
Lymphocytes Relative: 31 %
Lymphs Abs: 1.4 10*3/uL (ref 0.7–4.0)
MCH: 28.5 pg (ref 26.0–34.0)
MCHC: 32.5 g/dL (ref 30.0–36.0)
MCV: 87.6 fL (ref 80.0–100.0)
Monocytes Absolute: 0.5 10*3/uL (ref 0.1–1.0)
Monocytes Relative: 10 %
Neutro Abs: 2.5 10*3/uL (ref 1.7–7.7)
Neutrophils Relative %: 55 %
Platelets: 125 10*3/uL — ABNORMAL LOW (ref 150–400)
RBC: 2.91 MIL/uL — ABNORMAL LOW (ref 4.22–5.81)
RDW: 18.1 % — ABNORMAL HIGH (ref 11.5–15.5)
WBC: 4.6 10*3/uL (ref 4.0–10.5)
nRBC: 0 % (ref 0.0–0.2)

## 2021-04-10 LAB — PREPARE RBC (CROSSMATCH)

## 2021-04-10 MED ORDER — PANTOPRAZOLE SODIUM 40 MG PO TBEC
40.0000 mg | DELAYED_RELEASE_TABLET | Freq: Two times a day (BID) | ORAL | 0 refills | Status: DC
Start: 2021-04-10 — End: 2023-07-07

## 2021-04-10 MED ORDER — HALOPERIDOL LACTATE 5 MG/ML IJ SOLN
1.0000 mg | Freq: Once | INTRAMUSCULAR | Status: AC
  Administered 2021-04-10: 1 mg via INTRAVENOUS
  Filled 2021-04-10: qty 1

## 2021-04-10 MED ORDER — ONDANSETRON HCL 4 MG PO TABS: 4.0000 mg | ORAL_TABLET | Freq: Three times a day (TID) | ORAL | 0 refills | Status: DC | PRN

## 2021-04-10 MED ORDER — CHLORPROMAZINE HCL 10 MG PO TABS: 10.0000 mg | ORAL_TABLET | Freq: Three times a day (TID) | ORAL | 0 refills | Status: DC | PRN

## 2021-04-10 MED ORDER — SUCRALFATE 1 GM/10ML PO SUSP: 1.0000 g | Freq: Three times a day (TID) | ORAL | 0 refills | Status: DC

## 2021-04-10 MED ORDER — GABAPENTIN 100 MG PO CAPS: 100.0000 mg | ORAL_CAPSULE | Freq: Three times a day (TID) | ORAL | 0 refills | Status: DC

## 2021-04-10 MED ORDER — SODIUM CHLORIDE 0.9 % IV SOLN
25.0000 mg | Freq: Once | INTRAVENOUS | Status: AC
  Administered 2021-04-10: 25 mg via INTRAVENOUS
  Filled 2021-04-10: qty 1

## 2021-04-10 NOTE — Anesthesia Postprocedure Evaluation (Signed)
Anesthesia Post Note ? ?Patient: Nathan Castaneda ? ?Procedure(s) Performed: ESOPHAGOGASTRODUODENOSCOPY (EGD) WITH PROPOFOL ? ?  ? ?Patient location during evaluation: PACU ?Anesthesia Type: MAC ?Level of consciousness: awake and alert ?Pain management: pain level controlled ?Vital Signs Assessment: post-procedure vital signs reviewed and stable ?Respiratory status: spontaneous breathing, nonlabored ventilation, respiratory function stable and patient connected to nasal cannula oxygen ?Cardiovascular status: stable and blood pressure returned to baseline ?Postop Assessment: no apparent nausea or vomiting ?Anesthetic complications: no ? ? ?No notable events documented. ? ?Last Vitals:  ?Vitals:  ? 04/10/21 0735 04/10/21 1201  ?BP: 126/83 98/71  ?Pulse: 97 88  ?Resp: 16 19  ?Temp: 36.9 ?C 37.2 ?C  ?SpO2: 100% 99%  ?  ?Last Pain:  ?Vitals:  ? 04/10/21 1201  ?TempSrc: Oral  ?PainSc:   ? ? ?  ?  ?  ?  ?  ?  ? ?Sherron Mapp ? ? ? ? ?

## 2021-04-10 NOTE — Discharge Instructions (Signed)
Follow with Primary MD Justin Mend, MD in 7 days  ? ?Get CBC, CMP, 2 view Chest X ray -  checked next visit within 1 week by Primary MD  ? ?Activity: As tolerated with Full fall precautions use walker/cane & assistance as needed ? ?Disposition Home  ? ?Diet: Renal diet with 1.5 lit/day fluid restriction.  ? ?Special Instructions: If you have smoked or chewed Tobacco  in the last 2 yrs please stop smoking, stop any regular Alcohol  and or any Recreational drug use. ? ?On your next visit with your primary care physician please Get Medicines reviewed and adjusted. ? ?Please request your Prim.MD to go over all Hospital Tests and Procedure/Radiological results at the follow up, please get all Hospital records sent to your Prim MD by signing hospital release before you go home. ? ?If you experience worsening of your admission symptoms, develop shortness of breath, life threatening emergency, suicidal or homicidal thoughts you must seek medical attention immediately by calling 911 or calling your MD immediately  if symptoms less severe. ? ?You Must read complete instructions/literature along with all the possible adverse reactions/side effects for all the Medicines you take and that have been prescribed to you. Take any new Medicines after you have completely understood and accpet all the possible adverse reactions/side effects.  ? ?  ?

## 2021-04-10 NOTE — Discharge Summary (Signed)
?                                                                                ? Nathan Castaneda PJA:[id number] DOB: [birth year] DOA: 03/31/2021 ? ?PCP: Justin Mend, MD ? ?Admit date: 03/31/2021  Discharge date: 04/10/2021 ? ?Admitted From: Home   Disposition:  Home ? ? ?Recommendations for Outpatient Follow-up:  ? ?Follow up with PCP in 1-2 weeks ? ?PCP Please obtain BMP/CBC, 2 view CXR in 1week,  (see Discharge instructions)  ? ?PCP Please follow up on the following pending results: Needs close outpatient GI follow-up. ? ? ?Home Health: None   ?Equipment/Devices: None  ?Consultations: GI, CCS, Renal ?Discharge Condition: Stable    ?CODE STATUS: Full    ?Diet Recommendation: Renal diet with 1.5 L fluid restriction. ? ?CC - SBO ?  ? ?Brief history of present illness from the day of admission and additional interim summary   ? ?46 y.o. African-American male with medical history significant for essential hypertension, lupus, end-stage renal disease on hemodialysis on TTS, previously on peritoneal dialysis, pancreatitis, who presented to the ER at [facility] with acute onset diffuse abdominal pain with associated nausea and vomiting for a day, found to have SBO. ? ?                                                               Hospital Course  ? ? Small bowel obstruction most likely partial in the setting of acute pancreatitis.  Seen by general surgery and GI, initially had NG tube for bowel rest that after bowel activity improved NG tube was removed on 04/02/2021 now tolerating soft diet and clinically SBO has resolved. ?  ?2.  Unrelenting hiccups.  Seen by GI underwent EGD showing some esophagitis, he was placed on PPI and Carafate along with as needed Thorazine and Neurontin for a few days, hiccups are better, currently overall feels much better, will be discharged home with outpatient PCP and GI follow-up., ?  ?3.  H/O Pancreatitis - remote history of alcohol abuse but none in the last year, LFTs stable, right upper quadrant ultrasound shows stable CBD, stable triglycerides, IgG4 levels are stable.  Clinically pancreatitis has resolved he was seen by GI this admission. ?  ?4. GERD - PPI, increase dose to twice daily along with Carafate.  Seen by GI this admission underwent EGD showing some esophagitis. ?  ?5.  Anemia of chronic disease with likely some fall due to frequent blood draws.  Type screen transfuse 1 unit of packed RBC on 04/09/2021 no sign of active bleeding posttransfusion H&H is stable. ? ?6.  ESRD - Lupus -on HD, nephrology following. TTS. ? ? ?Discharge diagnosis   ? ? ?Principal Problem: ?  SBO (small bowel obstruction) ([place]) ?Active Problems: ?  Elevated lipase ?  ESRD (end stage renal disease) ([place]) ?  Hypertension ?  GERD without esophagitis ? ? ? ?Discharge instructions   ? ?Discharge Instructions   ? ?  Discharge instructions   Complete by: As directed ?  ? Follow with Primary MD Justin Mend, MD in 7 days  ? ?Get CBC, CMP, 2 view Chest X ray -  checked next visit within 1 week by Primary MD  ? ?Activity: As tolerated with Full fall precautions use walker/cane & assistance as needed ? ?Disposition Home  ? ?Diet: Renal diet with 1.5 lit/day fluid restriction.  ? ?Special Instructions: If you have smoked or chewed Tobacco  in the last 2 yrs please stop smoking, stop any regular Alcohol  and or any Recreational drug use. ? ?On your next visit with your primary care physician please Get Medicines reviewed and adjusted. ? ?Please request your Prim.MD to go over all Hospital Tests and Procedure/Radiological results at the follow up, please get all Hospital records sent to your Prim MD by signing hospital release before you go home. ? ?If you experience worsening of your admission symptoms, develop shortness of breath, life threatening emergency, suicidal or homicidal thoughts you must seek medical attention  immediately by calling 911 or calling your MD immediately  if symptoms less severe. ? ?You Must read complete instructions/literature along with all the possible adverse reactions/side effects for all the Medicines you take and that have been prescribed to you. Take any new Medicines after you have completely understood and accpet all the possible adverse reactions/side effects.  ? Increase activity slowly   Complete by: As directed ?  ? ?  ? ? ?Discharge Medications  ? ?Allergies as of 04/10/2021   ? ?   Reactions  ? Vancomycin Itching  ? ?  ? ?  ?Medication List  ?  ? ?TAKE these medications   ? ?b complex-vitamin c-folic acid 0.8 MG Tabs tablet ?Take 1 tablet by mouth daily. ?  ?calcitRIOL 0.25 MCG capsule ?Commonly known as: ROCALTROL ?Take 0.25 mcg by mouth daily. ?  ?chlorproMAZINE 10 MG tablet ?Commonly known as: THORAZINE ?Take 1 tablet (10 mg total) by mouth 3 (three) times daily as needed for hiccoughs. ?  ?ferric citrate 1 GM 210 MG(Fe) tablet ?Commonly known as: AURYXIA ?Take 210 mg by mouth in the morning, at noon, and at bedtime. ?  ?furosemide 80 MG tablet ?Commonly known as: LASIX ?Take 80 mg by mouth daily. ?  ?gabapentin 100 MG capsule ?Commonly known as: Neurontin ?Take 1 capsule (100 mg total) by mouth 3 (three) times daily for 10 days. ?  ?ondansetron 4 MG tablet ?Commonly known as: ZOFRAN ?Take 1 tablet (4 mg total) by mouth every 8 (eight) hours as needed for nausea. ?  ?pantoprazole 40 MG tablet ?Commonly known as: PROTONIX ?Take 1 tablet (40 mg total) by mouth 2 (two) times daily. ?What changed: when to take this ?  ?sildenafil 100 MG tablet ?Commonly known as: VIAGRA ?Take 100 mg by mouth daily as needed for erectile dysfunction. ?  ?sucralfate 1 GM/10ML suspension ?Commonly known as: CARAFATE ?Take 10 mLs (1 g total) by mouth 4 (four) times daily -  with meals and at bedtime for 7 days. ?  ? ?  ? ? ? Follow-up Information   ? ? [facility]. Call.   ?Why:  Please contact them for emergency utility assistance. ?Contact information: ?[address], [place], [place] [address] ? ?[phone] ?Non-Emergency Medicaid Transportation: [phone] ? ?  ?  ? ? Justin Mend, MD. Schedule an appointment as soon as possible for a visit.   ?Specialty: Internal Medicine ?Why: Dr. Johnney Ou  office will call you to make apt for hospital follow up ?Contact information: ?[address] ?[place] [address] ?[phone] ? ? ?  ?  ? ? Carol Ada, MD. Schedule an appointment as soon as possible for a visit in 1 week(s).   ?Specialty: Gastroenterology ?Why: hiccups ?Contact information: ?[address], [address] ?[place] [address] ?[phone] ? ? ?  ?  ? ?  ?  ? ?  ? ? ?Major procedures and Radiology Reports - PLEASE review detailed and final reports thoroughly  -    ? ? ?  ?DG Abd 1 View ? ?Result Date: 04/07/2021 ?CLINICAL DATA:  Vomiting.  Concern for small bowel obstruction. EXAM: ABDOMEN - 1 VIEW COMPARISON:  Abdominal radiograph dated 04/05/2021. FINDINGS: Multiple normal caliber air-filled loops of small bowel, nonspecific. Clinical correlation is recommended to evaluate for possibility of enteritis. No bowel dilatation or evidence of obstruction. No free air or radiopaque calculi. The osseous structures are intact. The soft tissues are unremarkable. Right femoral approach catheter with tip at the inferior vena and right atrium junction. IMPRESSION: No bowel dilatation. Electronically Signed   By: Anner Crete M.D.   On: 04/07/2021 23:01  ? ?US Abdomen Complete ? ?Result Date: 04/02/2021 ?CLINICAL DATA:  Known history of small-bowel obstruction and abdominal pain, initial encounter EXAM: ABDOMEN ULTRASOUND COMPLETE COMPARISON:  CT from 03/30/2021 FINDINGS: Gallbladder: No gallstones or wall thickening visualized. No sonographic Murphy sign noted by sonographer. Common bile duct: Diameter: 7.8 mm Liver: No focal lesion identified. Within normal limits in  parenchymal echogenicity. Portal vein is patent on color Doppler imaging with normal direction of blood flow towards the liver. IVC: No abnormality visualized. Pancreas: Visualized. Spleen: Size and appearance with

## 2021-04-10 NOTE — Progress Notes (Signed)
?[facility] ?Progress Note  ? ?Subjective:   Tolerated dialysis yesterday without UF. Received 1 unit PRBC, Hgb 8.3 this AM. Reports he is tired, no other concerns. Denies SOB, CP, abdominal pain and nausea.  ? ?Objective ?Vitals:  ? 04/09/21 1928 04/09/21 2300 04/10/21 0559 04/10/21 0735  ?BP: 102/60 95/63 124/80 126/83  ?Pulse: 76 77 77 97  ?Resp: 18 20 18 16   ?Temp: 98.4 ?F (36.9 ?C) 98.9 ?F (37.2 ?C) 99.6 ?F (37.6 ?C) 98.5 ?F (36.9 ?C)  ?TempSrc: Oral Oral Oral Oral  ?SpO2: 98% 100% 98% 100%  ?Weight:      ?Height:      ? ?Physical Exam ?General: Well developed, alert and in NAD ?Heart: RRR, no murmurs, rubs or gallops ?Lungs: CTA bilaterally without wheezing, rhonchi or rales ?Abdomen: Non-distended, +BS ?Extremities: No edema b/l lower extremities ?Dialysis Access: [facility] with intact dressing ? ?Additional Objective ?Labs: ?Basic Metabolic Panel: ?Recent Labs  ?Lab 04/08/21 ?0204 04/09/21 ?0122 04/10/21 ?0133  ?NA 135 137 137  ?K 3.3* 3.4* 3.9  ?CL 97* 101 102  ?CO2 27 27 27   ?GLUCOSE 89 121* 97  ?BUN 12 19 8   ?CREATININE 6.27* 8.63* 5.15*  ?CALCIUM 8.3* 8.6* 8.4*  ? ?Liver Function Tests: ?Recent Labs  ?Lab 04/08/21 ?0204 04/09/21 ?0122 04/10/21 ?0133  ?AST 15 16 14*  ?ALT 9 8 9   ?ALKPHOS 70 70 73  ?BILITOT 0.5 0.2* 0.2*  ?PROT 7.2 6.3* 6.6  ?ALBUMIN 2.5* 2.4* 2.4*  ? ?Recent Labs  ?Lab 04/06/21 ?0327 04/07/21 ?0159 04/08/21 ?2706  ?LIPASE 25 58* 109*  ? ?CBC: ?Recent Labs  ?Lab 04/06/21 ?0327 04/07/21 ?0159 04/08/21 ?2376 04/09/21 ?0122 04/10/21 ?0133  ?WBC 5.3 5.6 5.0 5.0 4.6  ?NEUTROABS 2.5 3.0 2.8 2.7 2.5  ?HGB 7.8* 8.1* 7.2* 6.8* 8.3*  ?HCT 23.9* 25.6* 22.4* 21.5* 25.5*  ?MCV 89.8 86.2 87.8 90.7 87.6  ?PLT 157 PLATELET CLUMPS NOTED ON SMEAR, UNABLE TO ESTIMATE 141* 134* 125*  ? ? ?Medications: ? chlorproMAZINE (THORAZINE) IV 25 mg (04/09/21 2210)  ? chlorproMAZINE (THORAZINE) IV    ? promethazine (PHENERGAN) injection (IM or IVPB) Stopped (04/04/21 2339)  ? ? sodium chloride   Intravenous  Once  ? sodium chloride   Intravenous Once  ? chlorhexidine  15 mL Mouth Rinse BID  ? Chlorhexidine Gluconate Cloth  6 each Topical Q0600  ? darbepoetin (ARANESP) injection - NON-DIALYSIS  200 mcg Subcutaneous Q Thu-1800  ? haloperidol lactate  1 mg Intravenous Once  ? mouth rinse  15 mL Mouth Rinse q12n4p  ? pantoprazole  40 mg Oral BID  ? sucralfate  1 g Oral TID WC & HS  ? ? ?Dialysis Orders: ?TTS [facility] ? 4h  400/600 67.5kg  2/2 bath  R fem TDC   ?-Heparin 2900 units IV TIW ? - hectorol 1 ug tiw ? ?Assessment/Plan: ?1. Small Bowel Obstruction: Likely from adhesions from previous CAPD v. pancreatitis ileus picture. Resolved but developed abd pain again 3/30 . Now tolerating PO intake ?2. H/O Pancreatitis-Lipase globally trending down but today somewhat higher. No evidence of pancreatitis with imaging. Abdominal pain resolved  ?3. ESRD: HD TTS - tolerating HD well, next HD Tuesday, 04/12/21.  ?4. HTN/volume: BP controlled, no edema. Under OP EDW. Lower on discharge. UF as tolerated.  ?4. Anemia of Chronic Kidney disease: HGB down to 6.8 yesterday. No active bleeding reported. Rec'd Aranesp 200 mcg SQ 04/07/2021. Improved to 8.3 after PRBC transfusion on 04/09/21. Will hold heparin.  ?5. Secondary hyperparathyroidism:  Corrected Ca controlled, no recent PO4. Now tolerating food, resuming binders at discharge  ?6. Nutrition: Diet being advanced by primary.  ? ?Anice Paganini, PA-C ?04/10/2021, 8:38 AM  ?[facility] ?Pager: [phone] ? ? ?

## 2021-04-11 LAB — BPAM RBC
Blood Product Expiration Date: 202304282359
ISSUE DATE / TIME: 202304011744
Unit Type and Rh: 7300

## 2021-04-11 LAB — TYPE AND SCREEN
ABO/RH(D): B POS
Antibody Screen: POSITIVE
DAT, IgG: POSITIVE
Unit division: 0

## 2021-04-11 NOTE — TOC Transition Note (Signed)
Transition of care contact from inpatient facility ? ?Date of discharge: 04/10/21 ?Date of contact: 04/11/21 ?Method: Phone ?Spoke to: Patient ? ?Patient contacted to discuss transition of care from recent inpatient hospitalization. Patient was admitted to [facility] from 03/31/21-04/10/21 with discharge diagnosis of small bowel obstruction. ? ?Medication changes were reviewed. ? ?Patient will follow up with his outpatient HD unit on: Tuesday April 4th at [facility]. ? ?Tobie Poet, NP ?  ?

## 2021-05-18 ENCOUNTER — Other Ambulatory Visit: Payer: Self-pay | Admitting: *Deleted

## 2021-05-18 DIAGNOSIS — N186 End stage renal disease: Secondary | ICD-10-CM

## 2021-06-06 NOTE — Progress Notes (Deleted)
  POST OPERATIVE DIALYSIS ACCESS OFFICE NOTE    CC:  F/u for dialysis access surgery  HPI:  This is a 46 y.o. 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  At follow-up, his right antecubital fossa incision had separated and he was taken to surgery 917/2021 for washout and closure by Dr. Donzetta Matters.  He performs peritoneal dialysis at home.  He denies difficulty with dialysis, fever or chills.  No hand or incisional pain.   Allergies  Allergen Reactions   Vancomycin Itching    Current Outpatient Medications  Medication Sig Dispense Refill   b complex-vitamin c-folic acid (NEPHRO-VITE) 0.8 MG TABS tablet Take 1 tablet by mouth daily.     calcitRIOL (ROCALTROL) 0.25 MCG capsule Take 0.25 mcg by mouth daily.     chlorproMAZINE (THORAZINE) 10 MG tablet Take 1 tablet (10 mg total) by mouth 3 (three) times daily as needed for hiccoughs. 20 tablet 0   ferric citrate (AURYXIA) 1 GM 210 MG(Fe) tablet Take 210 mg by mouth in the morning, at noon, and at bedtime.     furosemide (LASIX) 80 MG tablet Take 80 mg by mouth daily.     gabapentin (NEURONTIN) 100 MG capsule Take 1 capsule (100 mg total) by mouth 3 (three) times daily for 10 days. 30 capsule 0   ondansetron (ZOFRAN) 4 MG tablet Take 1 tablet (4 mg total) by mouth every 8 (eight) hours as needed for nausea. 20 tablet 0   pantoprazole (PROTONIX) 40 MG tablet Take 1 tablet (40 mg total) by mouth 2 (two) times daily. 60 tablet 0   sildenafil (VIAGRA) 100 MG tablet Take 100 mg by mouth daily as needed for erectile dysfunction.     sucralfate (CARAFATE) 1 GM/10ML suspension Take 10 mLs (1 g total) by mouth 4 (four) times daily -  with meals and at bedtime for 7 days. 280 mL 0   No current facility-administered medications for this visit.     ROS:  See  HPI  There were no vitals taken for this visit.   Physical Exam:  General appearance: Well-developed, well-nourished in no apparent distress Cardiac: Rate and rhythm are regular Respiratory: Nonlabored Incision: Well healed. Extremities: Moves all well.  Right hand warm with 2+ radial pulse  Assessment/Plan: 46 year old male with history of end-stage renal disease who developed edema and inflammation over aneurysmal dilatation of a nonfunctioning right upper extremity fistula.  This was repaired and he developed wound dehiscence postoperatively.  His wound was washed out and closed primarily with nylon sutures.  Today, his incision is well-healed and all sutures removed.  He continues on peritoneal dialysis.  He will follow-up as needed   -Cherre Robins, MD 06/06/2021 12:09 PM [facility] [phone]  Clinic MD: Trula Slade

## 2021-06-07 ENCOUNTER — Ambulatory Visit: Payer: Medicare Other | Admitting: Vascular Surgery

## 2021-06-07 ENCOUNTER — Encounter (HOSPITAL_COMMUNITY): Payer: Medicare Other

## 2021-09-21 ENCOUNTER — Telehealth (HOSPITAL_COMMUNITY): Payer: Self-pay | Admitting: Nephrology

## 2021-09-21 NOTE — Telephone Encounter (Signed)
FYI -- Received phone call from his dialysis unit this AM.   Pt had called the unit and reported that his tunneled dialysis catheter "fell out". He had missed dialysis yesterday (TTS sched) and he has chronic hyperkalemia issues. He has no transportation for outpatient procedure, so he reported that he was going to call EMS and request to be brought to [facility] for evaluation.  Will need IR consult when arrives for placement of a new dialysis catheter. As well as will need his K checked.  Thank you - pls consult nephrology for assistance if needed.  Veneta Penton, PA-C [employer] Pager [phone]

## 2021-09-22 ENCOUNTER — Other Ambulatory Visit: Payer: Self-pay | Admitting: Physician Assistant

## 2021-09-22 ENCOUNTER — Other Ambulatory Visit: Payer: Self-pay | Admitting: *Deleted

## 2021-09-22 DIAGNOSIS — N186 End stage renal disease: Secondary | ICD-10-CM

## 2021-09-22 NOTE — Progress Notes (Signed)
Received call from [facility] ED - patient with partially dislodged 44 cm tunneled groin HD catheter, IR/OR at [facility] do not have catheter long enough to accommodate exchange (longest available is 35 cm). Request for patient to be  seen at [facility] for catheter exchange.  Discussed with Dr. Sharen Heck (ED physician) - he will have patient admitted to [facility] and patient to be NPO at midnight in case sedation is required. Discussed with [facility] - patient to arrive at [facility] at 9 am on 9/15 for procedure and will return to [facility] post procedure.  Candiss Norse, PA-C

## 2021-09-23 ENCOUNTER — Ambulatory Visit (HOSPITAL_COMMUNITY)
Admission: RE | Admit: 2021-09-23 | Discharge: 2021-09-23 | Disposition: A | Discharge status: Home / Self Care | Payer: Medicare Other | Source: Ambulatory Visit | Attending: Radiology | Admitting: Radiology

## 2021-09-23 ENCOUNTER — Other Ambulatory Visit (HOSPITAL_COMMUNITY): Payer: Self-pay | Admitting: Radiology

## 2021-09-23 ENCOUNTER — Other Ambulatory Visit: Payer: Self-pay | Admitting: Radiology

## 2021-09-23 DIAGNOSIS — Y841 Kidney dialysis as the cause of abnormal reaction of the patient, or of later complication, without mention of misadventure at the time of the procedure: Secondary | ICD-10-CM | POA: Insufficient documentation

## 2021-09-23 DIAGNOSIS — N186 End stage renal disease: Secondary | ICD-10-CM | POA: Diagnosis not present

## 2021-09-23 DIAGNOSIS — F17210 Nicotine dependence, cigarettes, uncomplicated: Secondary | ICD-10-CM | POA: Insufficient documentation

## 2021-09-23 DIAGNOSIS — T8249XA Other complication of vascular dialysis catheter, initial encounter: Secondary | ICD-10-CM | POA: Insufficient documentation

## 2021-09-23 DIAGNOSIS — D631 Anemia in chronic kidney disease: Secondary | ICD-10-CM | POA: Diagnosis not present

## 2021-09-23 DIAGNOSIS — M3214 Glomerular disease in systemic lupus erythematosus: Secondary | ICD-10-CM | POA: Diagnosis not present

## 2021-09-23 DIAGNOSIS — I120 Hypertensive chronic kidney disease with stage 5 chronic kidney disease or end stage renal disease: Secondary | ICD-10-CM | POA: Diagnosis not present

## 2021-09-23 DIAGNOSIS — Z992 Dependence on renal dialysis: Secondary | ICD-10-CM | POA: Diagnosis not present

## 2021-09-23 HISTORY — PX: IR FLUORO GUIDE CV LINE RIGHT: IMG2283

## 2021-09-23 MED ORDER — FENTANYL CITRATE (PF) 100 MCG/2ML IJ SOLN
INTRAMUSCULAR | Status: AC
  Filled 2021-09-23: qty 2

## 2021-09-23 MED ORDER — FENTANYL CITRATE (PF) 100 MCG/2ML IJ SOLN
INTRAMUSCULAR | Status: AC | PRN
  Administered 2021-09-23: 50 ug via INTRAVENOUS

## 2021-09-23 MED ORDER — LIDOCAINE-EPINEPHRINE 1 %-1:100000 IJ SOLN
INTRAMUSCULAR | Status: AC
  Filled 2021-09-23: qty 1

## 2021-09-23 MED ORDER — CEFAZOLIN SODIUM-DEXTROSE 2-4 GM/100ML-% IV SOLN: 2.0000 g | Freq: Once | INTRAVENOUS | Status: DC

## 2021-09-23 MED ORDER — CEFAZOLIN SODIUM-DEXTROSE 2-4 GM/100ML-% IV SOLN
INTRAVENOUS | Status: AC
  Filled 2021-09-23: qty 100

## 2021-09-23 MED ORDER — MIDAZOLAM HCL 2 MG/2ML IJ SOLN
INTRAMUSCULAR | Status: AC | PRN
  Administered 2021-09-23: 1 mg via INTRAVENOUS

## 2021-09-23 MED ORDER — LIDOCAINE-EPINEPHRINE 2 %-1:100000 IJ SOLN
INTRAMUSCULAR | Status: AC | PRN
Start: 2021-09-23 — End: 2021-09-23
  Administered 2021-09-23: 10 mL

## 2021-09-23 MED ORDER — MIDAZOLAM HCL 2 MG/2ML IJ SOLN
INTRAMUSCULAR | Status: AC
  Filled 2021-09-23: qty 2

## 2021-09-23 MED ORDER — CEFAZOLIN SODIUM-DEXTROSE 2-4 GM/100ML-% IV SOLN
INTRAVENOUS | Status: AC | PRN
  Administered 2021-09-23: 2 g via INTRAVENOUS

## 2021-09-23 MED ORDER — HEPARIN SODIUM (PORCINE) 1000 UNIT/ML IJ SOLN
INTRAMUSCULAR | Status: AC
  Administered 2021-09-23: 5.2 mL
  Filled 2021-09-23: qty 10

## 2021-09-23 NOTE — Sedation Documentation (Signed)
[facility] here to transport pt back to [facility].

## 2021-09-23 NOTE — Sedation Documentation (Signed)
[facility] called to request transport back to [facility]

## 2021-09-23 NOTE — Procedures (Signed)
Interventional Radiology Procedure Note  Procedure: fluoro rt fem HD cath exchg    Complications: None  Estimated Blood Loss:  min  Findings: Tip IVC RA junction    Tamera Punt, MD

## 2021-09-23 NOTE — H&P (Signed)
Chief Complaint: Malpositioned femoral tunneled HD line with cuff exposure. Request is for line exchange.   Referring Physician(s): Dr. Baxter Flattery ( ED MD [facility])  Supervising Physician: Daryll Brod  Patient Status: [facility] - Out-pt  History of Present Illness: Nathan Castaneda is a 46 y.o. male inpatient [facility]. History of lupus, HTN, anemia, ESRD on HD with a femoral tunneled catheter. Per Team patient 's existing femoral catheter is malpositioned with the cuff now exposed.  Team is requesting a tunneled HD catheter exchange.   Patient alert and laying in bed, calm . Endorses right elbow pain that he states is related to "sleeping on it wrong". Denies any fevers, headache, chest pain, SOB, cough, abdominal pain, nausea, vomiting or bleeding. Return precautions and treatment recommendations and follow-up discussed with the patient  who is agreeable with the plan.    Past Medical History:  Diagnosis Date   Anemia    Anxiety    History of recent blood transfusion    Hypertension    Lupus ([place])    Renal disease     Past Surgical History:  Procedure Laterality Date   AV FISTULA PLACEMENT     ESOPHAGOGASTRODUODENOSCOPY (EGD) WITH PROPOFOL N/A 04/08/2021   Procedure: ESOPHAGOGASTRODUODENOSCOPY (EGD) WITH PROPOFOL;  Surgeon: Carol Ada, MD;  Location: [place];  Service: Gastroenterology;  Laterality: N/A;   PERITONEAL CATHETER INSERTION     REVISON OF ARTERIOVENOUS FISTULA Right 09/12/2019   Procedure: 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 artery with reversed saphenous vein from left ankle;  Surgeon: Rosetta Posner, MD;  Location: [facility] OR;  Service: Vascular;  Laterality: Right;   VEIN HARVEST Left 09/12/2019   Procedure: SAPHENOUS VEIN HARVEST;  Surgeon: Rosetta Posner, MD;  Location: [facility] OR;  Service: Vascular;  Laterality: Left;   WOUND DEBRIDEMENT Right 09/26/2019   Procedure: RIGHT UPPER EXTREMITY WOUND WASHOUT;  Surgeon: Rosetta Posner, MD;   Location: [place];  Service: Vascular;  Laterality: Right;    Allergies: Vancomycin  Medications: Prior to Admission medications   Medication Sig Start Date End Date Taking? Authorizing Provider  b complex-vitamin c-folic acid (NEPHRO-VITE) 0.8 MG TABS tablet Take 1 tablet by mouth daily. 02/03/21   [provider]  calcitRIOL (ROCALTROL) 0.25 MCG capsule Take 0.25 mcg by mouth daily. 08/20/19   [provider]  chlorproMAZINE (THORAZINE) 10 MG tablet Take 1 tablet (10 mg total) by mouth 3 (three) times daily as needed for hiccoughs. 04/10/21   Thurnell Lose, MD  ferric citrate (AURYXIA) 1 GM 210 MG(Fe) tablet Take 210 mg by mouth in the morning, at noon, and at bedtime.    [provider]  furosemide (LASIX) 80 MG tablet Take 80 mg by mouth daily. 02/04/18   [provider]  gabapentin (NEURONTIN) 100 MG capsule Take 1 capsule (100 mg total) by mouth 3 (three) times daily for 10 days. 04/10/21 04/20/21  Thurnell Lose, MD  ondansetron (ZOFRAN) 4 MG tablet Take 1 tablet (4 mg total) by mouth every 8 (eight) hours as needed for nausea. 04/10/21   Thurnell Lose, MD  pantoprazole (PROTONIX) 40 MG tablet Take 1 tablet (40 mg total) by mouth 2 (two) times daily. 04/10/21   Thurnell Lose, MD  sildenafil (VIAGRA) 100 MG tablet Take 100 mg by mouth daily as needed for erectile dysfunction. 02/03/21   [provider]  sucralfate (CARAFATE) 1 GM/10ML suspension Take 10 mLs (1 g total) by mouth 4 (four) times daily -  with meals and at bedtime for 7 days. 04/10/21 04/17/21  Thurnell Lose, MD     No family history on file.  Social History   Socioeconomic History   Marital status: Single    Spouse name: Not on file   Number of children: Not on file   Years of education: 12   Highest education level: Not on file  Occupational History   Occupation: Disabled  Tobacco Use   Smoking status: Every Day    Packs/day: 0.50    Years: 15.00    Total pack  years: 7.50    Types: Cigarettes   Smokeless tobacco: Never  Vaping Use   Vaping Use: Never used  Substance and Sexual Activity   Alcohol use: Never   Drug use: Never   Sexual activity: Yes    Birth control/protection: Condom  Other Topics Concern   Not on file  Social History Narrative   Not on file   Social Determinants of Health   Financial Resource Strain: Not on file  Food Insecurity: Not on file  Transportation Needs: Not on file  Physical Activity: Not on file  Stress: Not on file  Social Connections: Not on file    Review of Systems: A 12 point ROS discussed and pertinent positives are indicated in the HPI above.  All other systems are negative.  Review of Systems  Constitutional:  Negative for fever.  HENT:  Negative for congestion.   Respiratory:  Negative for cough and shortness of breath.   Cardiovascular:  Negative for chest pain.  Gastrointestinal:  Negative for abdominal pain.  Musculoskeletal:  Positive for myalgias (right elbow pain.).  Neurological:  Negative for headaches.  Psychiatric/Behavioral:  Negative for behavioral problems and confusion.     Vital Signs: There were no vitals taken for this visit.    Physical Exam Vitals and nursing note reviewed.  Constitutional:      Appearance: He is well-developed.  HENT:     Head: Normocephalic.  Cardiovascular:     Rate and Rhythm: Normal rate and regular rhythm.     Heart sounds: Normal heart sounds.  Pulmonary:     Effort: Pulmonary effort is normal.     Breath sounds: Normal breath sounds.  Musculoskeletal:        General: Normal range of motion.     Cervical back: Normal range of motion.  Skin:    General: Skin is dry.  Neurological:     Mental Status: He is alert and oriented to person, place, and time.     Imaging: No results found.  Labs:  CBC: Recent Labs    04/07/21 0159 04/08/21 0204 04/09/21 0122 04/10/21 0133  WBC 5.6 5.0 5.0 4.6  HGB 8.1* 7.2* 6.8* 8.3*  HCT  25.6* 22.4* 21.5* 25.5*  PLT PLATELET CLUMPS NOTED ON SMEAR, UNABLE TO ESTIMATE 141* 134* 125*    COAGS: No results for input(s): "INR", "APTT" in the last 8760 hours.  BMP: Recent Labs    04/07/21 0159 04/08/21 0204 04/09/21 0122 04/10/21 0133  NA 138 135 137 137  K 4.2 3.3* 3.4* 3.9  CL 100 97* 101 102  CO2 26 27 27 27   GLUCOSE 84 89 121* 97  BUN 38* 12 19 8   CALCIUM 9.1 8.3* 8.6* 8.4*  CREATININE 11.43* 6.27* 8.63* 5.15*  GFRNONAA 5* 11* 7* 13*    LIVER FUNCTION TESTS: Recent Labs    04/07/21 0159 04/08/21 0204 04/09/21 0122 04/10/21 0133  BILITOT 0.6 0.5 0.2*  0.2*  AST 14* 15 16 14*  ALT 9 9 8 9   ALKPHOS 66 70 70 73  PROT 7.1 7.2 6.3* 6.6  ALBUMIN 2.5* 2.5* 2.4* 2.4*      Assessment and Plan:  46 y.o. male inpatient [facility]. History of lupus, HTN, anemia, ESRD on HD with a femoral tunneled catheter. Per Team patient 's existing femoral catheter is malpositioned with the cuff now exposed.  Team is requesting a tunneled HD catheter exchange.   Per ED MD at [facility] ( Dr. Baxter Flattery) All electrolytes are WNL. All medications are within acceptable parameters. Allergies include vancomycin. Confirmed with Team at [facility]. Patient to be transported back to [facility] where he will be discharged. Patient verbalized understanding and is in agreement with the plan of care.   Risks and benefits discussed with the patient including, but not limited to bleeding, infection, vascular injury, pneumothorax which may require chest tube placement, air embolism or even death  All of the patient's questions were answered, patient is agreeable to proceed. Consent signed and in chart.   Thank you for this interesting consult.  I greatly enjoyed meeting Jashawn A Lawrence and look forward to participating in their care.  A copy of this report was sent to the requesting provider on this date.  Electronically Signed: Jacqualine Mau, NP 09/23/2021, 9:31 AM   I spent a  total of  30 Minutes   in face to face in clinical consultation, greater than 50% of which was counseling/coordinating care for femoral HD tunneled HD exchange.

## 2021-09-28 ENCOUNTER — Ambulatory Visit: Payer: Medicare Other

## 2021-09-28 ENCOUNTER — Ambulatory Visit (HOSPITAL_COMMUNITY): Payer: Medicare Other | Attending: Vascular Surgery

## 2022-02-20 ENCOUNTER — Inpatient Hospital Stay: Admit: 2022-02-20 | Payer: 59 | Admitting: Internal Medicine

## 2022-02-20 ENCOUNTER — Encounter (HOSPITAL_COMMUNITY): Payer: Self-pay

## 2022-02-21 ENCOUNTER — Inpatient Hospital Stay (HOSPITAL_COMMUNITY)
Admission: EM | Admit: 2022-02-21 | Discharge: 2022-02-23 | DRG: 314 | Disposition: A | Discharge status: Home / Self Care | Payer: 59 | Attending: Internal Medicine | Admitting: Internal Medicine

## 2022-02-21 ENCOUNTER — Encounter (HOSPITAL_COMMUNITY): Payer: Self-pay | Admitting: Emergency Medicine

## 2022-02-21 ENCOUNTER — Other Ambulatory Visit: Payer: Self-pay

## 2022-02-21 DIAGNOSIS — M329 Systemic lupus erythematosus, unspecified: Secondary | ICD-10-CM | POA: Diagnosis present

## 2022-02-21 DIAGNOSIS — K219 Gastro-esophageal reflux disease without esophagitis: Secondary | ICD-10-CM | POA: Diagnosis present

## 2022-02-21 DIAGNOSIS — N2581 Secondary hyperparathyroidism of renal origin: Secondary | ICD-10-CM | POA: Diagnosis present

## 2022-02-21 DIAGNOSIS — D72819 Decreased white blood cell count, unspecified: Secondary | ICD-10-CM | POA: Diagnosis present

## 2022-02-21 DIAGNOSIS — F17210 Nicotine dependence, cigarettes, uncomplicated: Secondary | ICD-10-CM | POA: Diagnosis present

## 2022-02-21 DIAGNOSIS — D696 Thrombocytopenia, unspecified: Secondary | ICD-10-CM | POA: Diagnosis present

## 2022-02-21 DIAGNOSIS — N186 End stage renal disease: Secondary | ICD-10-CM

## 2022-02-21 DIAGNOSIS — D631 Anemia in chronic kidney disease: Secondary | ICD-10-CM | POA: Diagnosis present

## 2022-02-21 DIAGNOSIS — Z91158 Patient's noncompliance with renal dialysis for other reason: Secondary | ICD-10-CM

## 2022-02-21 DIAGNOSIS — Z79899 Other long term (current) drug therapy: Secondary | ICD-10-CM

## 2022-02-21 DIAGNOSIS — Z992 Dependence on renal dialysis: Secondary | ICD-10-CM | POA: Diagnosis not present

## 2022-02-21 DIAGNOSIS — T827XXA Infection and inflammatory reaction due to other cardiac and vascular devices, implants and grafts, initial encounter: Secondary | ICD-10-CM | POA: Diagnosis present

## 2022-02-21 DIAGNOSIS — Y841 Kidney dialysis as the cause of abnormal reaction of the patient, or of later complication, without mention of misadventure at the time of the procedure: Secondary | ICD-10-CM | POA: Diagnosis present

## 2022-02-21 DIAGNOSIS — I120 Hypertensive chronic kidney disease with stage 5 chronic kidney disease or end stage renal disease: Secondary | ICD-10-CM | POA: Diagnosis present

## 2022-02-21 DIAGNOSIS — E876 Hypokalemia: Secondary | ICD-10-CM | POA: Diagnosis present

## 2022-02-21 DIAGNOSIS — I10 Essential (primary) hypertension: Secondary | ICD-10-CM | POA: Diagnosis not present

## 2022-02-21 DIAGNOSIS — B957 Other staphylococcus as the cause of diseases classified elsewhere: Secondary | ICD-10-CM | POA: Diagnosis present

## 2022-02-21 DIAGNOSIS — R7881 Bacteremia: Secondary | ICD-10-CM | POA: Insufficient documentation

## 2022-02-21 LAB — COMPREHENSIVE METABOLIC PANEL
ALT: 5 U/L (ref 0–44)
AST: 9 U/L — ABNORMAL LOW (ref 15–41)
Albumin: 2.6 g/dL — ABNORMAL LOW (ref 3.5–5.0)
Alkaline Phosphatase: 74 U/L (ref 38–126)
Anion gap: 13 (ref 5–15)
BUN: 37 mg/dL — ABNORMAL HIGH (ref 6–20)
CO2: 27 mmol/L (ref 22–32)
Calcium: 8.4 mg/dL — ABNORMAL LOW (ref 8.9–10.3)
Chloride: 95 mmol/L — ABNORMAL LOW (ref 98–111)
Creatinine, Ser: 13.56 mg/dL — ABNORMAL HIGH (ref 0.61–1.24)
GFR, Estimated: 4 mL/min — ABNORMAL LOW (ref >60)
Glucose, Bld: 92 mg/dL (ref 70–99)
Potassium: 3.4 mmol/L — ABNORMAL LOW (ref 3.5–5.1)
Sodium: 135 mmol/L (ref 135–145)
Total Bilirubin: 0.4 mg/dL (ref 0.3–1.2)
Total Protein: 6.6 g/dL (ref 6.5–8.1)

## 2022-02-21 LAB — CBC WITH DIFFERENTIAL/PLATELET
Abs Immature Granulocytes: 0.02 10*3/uL (ref 0.00–0.07)
Basophils Absolute: 0 10*3/uL (ref 0.0–0.1)
Basophils Relative: 0 %
Eosinophils Absolute: 0.1 10*3/uL (ref 0.0–0.5)
Eosinophils Relative: 2 %
HCT: 28 % — ABNORMAL LOW (ref 39.0–52.0)
Hemoglobin: 9.1 g/dL — ABNORMAL LOW (ref 13.0–17.0)
Immature Granulocytes: 1 %
Lymphocytes Relative: 35 %
Lymphs Abs: 1.3 10*3/uL (ref 0.7–4.0)
MCH: 25.7 pg — ABNORMAL LOW (ref 26.0–34.0)
MCHC: 32.5 g/dL (ref 30.0–36.0)
MCV: 79.1 fL — ABNORMAL LOW (ref 80.0–100.0)
Monocytes Absolute: 0.3 10*3/uL (ref 0.1–1.0)
Monocytes Relative: 9 %
Neutro Abs: 2 10*3/uL (ref 1.7–7.7)
Neutrophils Relative %: 53 %
Platelets: 130 10*3/uL — ABNORMAL LOW (ref 150–400)
RBC: 3.54 MIL/uL — ABNORMAL LOW (ref 4.22–5.81)
RDW: 16.9 % — ABNORMAL HIGH (ref 11.5–15.5)
WBC: 3.7 10*3/uL — ABNORMAL LOW (ref 4.0–10.5)
nRBC: 0 % (ref 0.0–0.2)

## 2022-02-21 LAB — LACTIC ACID, PLASMA
Lactic Acid, Venous: 1 mmol/L (ref 0.5–1.9)
Lactic Acid, Venous: 1.7 mmol/L (ref 0.5–1.9)

## 2022-02-21 MED ORDER — DIPHENHYDRAMINE HCL 25 MG PO CAPS
25.0000 mg | ORAL_CAPSULE | Freq: Once | ORAL | Status: AC
  Administered 2022-02-21: 25 mg via ORAL
  Filled 2022-02-21: qty 1

## 2022-02-21 MED ORDER — FERRIC CITRATE 1 GM 210 MG(FE) PO TABS
630.0000 mg | ORAL_TABLET | Freq: Three times a day (TID) | ORAL | Status: DC
  Administered 2022-02-22 – 2022-02-23 (×4): 630 mg via ORAL
  Filled 2022-02-21 (×5): qty 3

## 2022-02-21 MED ORDER — VANCOMYCIN HCL 10 G IV SOLR: 20.0000 mg/kg | Freq: Once | INTRAVENOUS | Status: DC

## 2022-02-21 MED ORDER — VANCOMYCIN HCL 1500 MG/300ML IV SOLN
1500.0000 mg | Freq: Once | INTRAVENOUS | Status: AC
  Administered 2022-02-21: 1500 mg via INTRAVENOUS
  Filled 2022-02-21: qty 300

## 2022-02-21 MED ORDER — VANCOMYCIN VARIABLE DOSE PER UNSTABLE RENAL FUNCTION (PHARMACIST DOSING): Status: DC

## 2022-02-21 MED ORDER — VANCOMYCIN HCL 1500 MG/300ML IV SOLN
1500.0000 mg | Freq: Once | INTRAVENOUS | Status: DC
  Filled 2022-02-21: qty 300

## 2022-02-21 MED ORDER — POTASSIUM CHLORIDE CRYS ER 20 MEQ PO TBCR
40.0000 meq | EXTENDED_RELEASE_TABLET | Freq: Once | ORAL | Status: AC
  Administered 2022-02-22: 40 meq via ORAL
  Filled 2022-02-21: qty 2

## 2022-02-21 NOTE — ED Provider Notes (Signed)
[facility] Provider Note   CSN: [id number] Arrival date & time: 02/21/22  1615     History  Chief Complaint  Patient presents with   Vascular Access Problem    Nathan Castaneda is a 47 y.o. male.  47 yo M with chief complaints of bacteremia.  Patient said he had blood cultures drawn at his dialysis center that were positive.  He was then told to come here for admission.  He denies weakness denies fevers denies cough or congestion.  He had no trouble with his tunneled dialysis catheter to his right femoral access.        Home Medications Prior to Admission medications   Medication Sig Start Date End Date Taking? Authorizing Provider  b complex-vitamin c-folic acid (NEPHRO-VITE) 0.8 MG TABS tablet Take 1 tablet by mouth daily. 02/03/21   [provider]  calcitRIOL (ROCALTROL) 0.25 MCG capsule Take 0.25 mcg by mouth daily. 08/20/19   [provider]  chlorproMAZINE (THORAZINE) 10 MG tablet Take 1 tablet (10 mg total) by mouth 3 (three) times daily as needed for hiccoughs. 04/10/21   Thurnell Lose, MD  ferric citrate (AURYXIA) 1 GM 210 MG(Fe) tablet Take 210 mg by mouth in the morning, at noon, and at bedtime.    [provider]  furosemide (LASIX) 80 MG tablet Take 80 mg by mouth daily. 02/04/18   [provider]  gabapentin (NEURONTIN) 100 MG capsule Take 1 capsule (100 mg total) by mouth 3 (three) times daily for 10 days. 04/10/21 04/20/21  Thurnell Lose, MD  ondansetron (ZOFRAN) 4 MG tablet Take 1 tablet (4 mg total) by mouth every 8 (eight) hours as needed for nausea. 04/10/21   Thurnell Lose, MD  pantoprazole (PROTONIX) 40 MG tablet Take 1 tablet (40 mg total) by mouth 2 (two) times daily. 04/10/21   Thurnell Lose, MD  sildenafil (VIAGRA) 100 MG tablet Take 100 mg by mouth daily as needed for erectile dysfunction. 02/03/21   [provider]  sucralfate (CARAFATE) 1 GM/10ML suspension  Take 10 mLs (1 g total) by mouth 4 (four) times daily -  with meals and at bedtime for 7 days. 04/10/21 04/17/21  Thurnell Lose, MD      Allergies    Vancomycin    Review of Systems   Review of Systems  Physical Exam Updated Vital Signs BP (!) 141/93   Pulse (!) 58   Temp 97.8 F (36.6 C) (Oral)   Resp 14   Ht 5' 10"$  (1.778 m)   Wt 74.8 kg   SpO2 96%   BMI 23.68 kg/m  Physical Exam Vitals and nursing note reviewed.  Constitutional:      Appearance: He is well-developed.  HENT:     Head: Normocephalic and atraumatic.  Eyes:     Pupils: Pupils are equal, round, and reactive to light.  Neck:     Vascular: No JVD.  Cardiovascular:     Rate and Rhythm: Normal rate and regular rhythm.     Heart sounds: No murmur heard.    No friction rub. No gallop.  Pulmonary:     Effort: No respiratory distress.     Breath sounds: No wheezing.  Abdominal:     General: There is no distension.     Tenderness: There is no abdominal tenderness. There is no guarding or rebound.  Musculoskeletal:        General: Normal range of motion.  Cervical back: Normal range of motion and neck supple.     Comments: Right femoral dialysis catheter without erythema no drainage no tenderness.  Skin:    Coloration: Skin is not pale.     Findings: No rash.  Neurological:     Mental Status: He is alert and oriented to person, place, and time.  Psychiatric:        Behavior: Behavior normal.     ED Results / Procedures / Treatments   Labs (all labs ordered are listed, but only abnormal results are displayed) Labs Reviewed  COMPREHENSIVE METABOLIC PANEL - Abnormal; Notable for the following components:      Result Value   Potassium 3.4 (*)    Chloride 95 (*)    BUN 37 (*)    Creatinine, Ser 13.56 (*)    Calcium 8.4 (*)    Albumin 2.6 (*)    AST 9 (*)    GFR, Estimated 4 (*)    All other components within normal limits  CBC WITH DIFFERENTIAL/PLATELET - Abnormal; Notable for the following  components:   WBC 3.7 (*)    RBC 3.54 (*)    Hemoglobin 9.1 (*)    HCT 28.0 (*)    MCV 79.1 (*)    MCH 25.7 (*)    RDW 16.9 (*)    Platelets 130 (*)    All other components within normal limits  CULTURE, BLOOD (ROUTINE X 2)  CULTURE, BLOOD (ROUTINE X 2)  LACTIC ACID, PLASMA  LACTIC ACID, PLASMA    EKG None  Radiology No results found.  Procedures Procedures    Medications Ordered in ED Medications  vancomycin (VANCOREADY) IVPB 1500 mg/300 mL (has no administration in time range)    ED Course/ Medical Decision Making/ A&P                             Medical Decision Making Amount and/or Complexity of Data Reviewed Labs: ordered.  Risk Prescription drug management.   47 yo M with a chief complaints of bacteremia.  This was found at his dialysis unit.  He was then sent here for IV antibiotics and tunneled dialysis catheter exchange.  On my record review it appears that this was meant for direct admission, I am not sure why he ended up in the emergency setting.  He has no symptoms.  His site looks good.  I discussed the case with the nephrologist, Dr. Candiss Norse.  Patient with coag negative staph, pansensitive.  Recommended giving Vanco.  No significant laboratory issue, no hyperkalemia, no acidosis.  Patient is not clinically fluid overloaded.  Will discuss with medicine for admission.  The patients results and plan were reviewed and discussed.   Any x-rays performed were independently reviewed by myself.   Differential diagnosis were considered with the presenting HPI.  Medications  vancomycin (VANCOREADY) IVPB 1500 mg/300 mL (has no administration in time range)    Vitals:   02/21/22 1638 02/21/22 1657 02/21/22 2016 02/21/22 2020  BP: (!) 161/90  (!) 141/93   Pulse: 73  (!) 58   Resp: 16  14   Temp: 98 F (36.7 C)   97.8 F (36.6 C)  TempSrc: Oral   Oral  SpO2: 98%  96%   Weight:  74.8 kg    Height:  5' 10"$  (1.778 m)      Final diagnoses:  Bacteremia     Admission/ observation were discussed with the admitting physician,  patient and/or family and they are comfortable with the plan.          Final Clinical Impression(s) / ED Diagnoses Final diagnoses:  Bacteremia    Rx / DC Orders ED Discharge Orders     None         Deno Etienne, DO 02/21/22 2107

## 2022-02-21 NOTE — Assessment & Plan Note (Addendum)
-   Mildly elevated.  Patient does not appear to be on antihypertensives.  Will need dialysis tomorrow for missed session today.

## 2022-02-21 NOTE — Progress Notes (Signed)
Pharmacy Antibiotic Note  Nathan Castaneda is a 47 y.o. male admitted on 02/21/2022 with bacteremia.  Pharmacy has been consulted for Vancomycin dosing. Patient is ESRD HD on TTS schedule, last HD Saturday per patient report. Patient was noted to have positive blood culture from HD center with coag negative staph. Unclear what antibiotic was given with HD on Saturday, random vancomycin level in ED undetectable. WBC 3.7, pt afebrile. Discussed with patient about allergy of "itching" which patient reports as feeling hot and itchy in the face, resembling vancomycin infusion reaction.   Plan: Vancomycin 1,500 mg IV x 1. Extended infusion time to prevent infusion reaction F/u dialysis plans for scheduling post-HD doses Monitor C&S, HD schedule, s/sx improvement, LOT   Height: 5' 10"$  (177.8 cm) Weight: 74.8 kg (165 lb) IBW/kg (Calculated) : 73  Temp (24hrs), Avg:97.9 F (36.6 C), Min:97.8 F (36.6 C), Max:98 F (36.7 C)  Recent Labs  Lab 02/21/22 1702 02/21/22 2055 02/21/22 2150  WBC 3.7*  --   --   CREATININE 13.56*  --   --   LATICACIDVEN  --  1.0  --   VANCORANDOM  --   --  <0    Estimated Creatinine Clearance: 7.1 mL/min (A) (by C-G formula based on SCr of 13.56 mg/dL (H)).    Allergies  Allergen Reactions   Vancomycin Itching    Antimicrobials this admission: 2/13 vancomycin >>   Dose adjustments this admission:   Microbiology results: 2/13 BCx: sent  Eliseo Gum, PharmD PGY1 Pharmacy Resident   02/21/2022  10:53 PM

## 2022-02-21 NOTE — Assessment & Plan Note (Signed)
-  Unfortunately we do not records of his previous blood cultures.  Blood cultures results were discussed between ED Dr. Tyrone Nine and nephrologist Dr. Candiss Norse. -Continue IV vancomycin-pharmacy is aware of his allergy and recommends Benadryl and rate reduction -Repeat blood cultures pending -Need IR consult tomorrow to arrange for catheter removal

## 2022-02-21 NOTE — ED Provider Triage Note (Signed)
Emergency Medicine Provider Triage Evaluation Note  Nathan Castaneda , a 47 y.o. male  was evaluated in triage.  Pt complains of dialysis access issue.  Was informed he needs his catheter changed due to an infection.  States he was at [facility] and was supposed to be transferred to [facility], ED, however was told it would be backed up for a few weeks and thus was advised to come POV.  Attends dialysis T/TH/S.  Last treatment Saturday, completed this and received antibiotics.  Review of Systems  Positive:  Negative: See above  Physical Exam  BP (!) 161/90 (BP Location: Right Arm)   Pulse 73   Temp 98 F (36.7 C) (Oral)   Resp 16   Ht 5' 10"$  (1.778 m)   Wt 74.8 kg   SpO2 98%   BMI 23.68 kg/m  Gen:   Awake, no distress   Resp:  Normal effort  MSK:   Moves extremities without difficulty  Other:  Sitting comfortably.  Gait intact.  Not diaphoretic.  Gaze aligned appropriately.  Answers questions appropriately.  Medical Decision Making  Medically screening exam initiated at 5:01 PM.  Appropriate orders placed.  Traci A Mcgaugh was informed that the remainder of the evaluation will be completed by another provider, this initial triage assessment does not replace that evaluation, and the importance of remaining in the ED until their evaluation is complete.     Prince Rome, PA-C [id number]

## 2022-02-21 NOTE — ED Triage Notes (Addendum)
Pt states that he needs dialysis catheter exchange due to infection reported by dialysis center. Pt states he was seen by [facility] and was supposed to be transferred to [facility] but it was "backed up for a couple weeks" so they advised him to come POV.  Dialysis T, Th, S. Pt states he received full treatment and antibiotics on Saturday.

## 2022-02-21 NOTE — Assessment & Plan Note (Signed)
HD TTS. Last session of HD Saturday. Missed dialysis today with creatinine up to 13.5.  -will need to coordinate with nephrology and IR regarding dialysis session and removal of right femoral HD cath

## 2022-02-21 NOTE — H&P (Addendum)
History and Physical    Patient: Nathan Castaneda [id number] DOB: [birth year] DOA: 02/21/2022 DOS: the patient was seen and examined on 02/22/2022 PCP: Justin Mend, MD  Patient coming from: Home  Chief Complaint:  Chief Complaint  Patient presents with   Vascular Access Problem   HPI: Nathan Castaneda is a 47 y.o. male with medical history significant of ESRD TTS, lupus, HTN, SBO, anemia of chronic disease, GERD who presents bacteremia from HD catheter.   Patient was initially seen at [facility] yesterday 2/12 after being sent there by nephrology for bacteremia. They are unable to proceed with cath exchange at [facility] so both nephrology and IR at [facility] were consulted and agree with transfer. Pt was meant to be a direct admission from [facility] yesterday but left and came directly to [facility] ED today due to long wait time. He remains asymptomatic. Denies any pain or drainage from right LE HD cath site. No fevers or chills.  Last session of HD Saturday. Has hx of failed bilateral UE AV fistulas.   ED Dr. Tyrone Nine discussed with nephrology Dr. Candiss Norse who informed him that pt had pan-sensitive coagulase negative staph.  He was otherwise afebrile, BP 160/90.   Leukopenia of 3.7, Hgb of 9, plt of 130. Na of 135, K of 3.4, creatinine of 13.56, CBG of 92.   He was started on IV Vancomycin.    Review of Systems: As mentioned in the history of present illness. All other systems reviewed and are negative. Past Medical History:  Diagnosis Date   Anemia    Anxiety    History of recent blood transfusion    Hypertension    Lupus ([place])    Renal disease    Past Surgical History:  Procedure Laterality Date   AV FISTULA PLACEMENT     ESOPHAGOGASTRODUODENOSCOPY (EGD) WITH PROPOFOL N/A 04/08/2021   Procedure: ESOPHAGOGASTRODUODENOSCOPY (EGD) WITH PROPOFOL;  Surgeon: Carol Ada, MD;  Location: [place];  Service: Gastroenterology;  Laterality: N/A;   IR FLUORO GUIDE CV LINE RIGHT   09/23/2021   PERITONEAL CATHETER INSERTION     REVISON OF ARTERIOVENOUS FISTULA Right 09/12/2019   Procedure: 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 artery with reversed saphenous vein from left ankle;  Surgeon: Rosetta Posner, MD;  Location: [facility] OR;  Service: Vascular;  Laterality: Right;   VEIN HARVEST Left 09/12/2019   Procedure: SAPHENOUS VEIN HARVEST;  Surgeon: Rosetta Posner, MD;  Location: [facility] OR;  Service: Vascular;  Laterality: Left;   WOUND DEBRIDEMENT Right 09/26/2019   Procedure: RIGHT UPPER EXTREMITY WOUND WASHOUT;  Surgeon: Rosetta Posner, MD;  Location: [place];  Service: Vascular;  Laterality: Right;   Social History:  reports that he has been smoking cigarettes. He has a 7.50 pack-year smoking history. He has never used smokeless tobacco. He reports that he does not drink alcohol and does not use drugs.  Allergies  Allergen Reactions   Vancomycin Itching    History reviewed. No pertinent family history.  Prior to Admission medications   Medication Sig Start Date End Date Taking? Authorizing Provider  ferric citrate (AURYXIA) 1 GM 210 MG(Fe) tablet Take 630 mg by mouth in the morning, at noon, and at bedtime.   Yes [provider]  LOKELMA 10 g PACK packet Take 1 packet by mouth daily.   Yes [provider]  chlorproMAZINE (THORAZINE) 10 MG tablet Take 1 tablet (10 mg total) by mouth 3 (three) times daily as needed  for hiccoughs. Patient not taking: Reported on 02/21/2022 04/10/21   Thurnell Lose, MD  furosemide (LASIX) 80 MG tablet Take 80 mg by mouth daily. Patient not taking: Reported on 02/21/2022 02/04/18   [provider]  gabapentin (NEURONTIN) 100 MG capsule Take 1 capsule (100 mg total) by mouth 3 (three) times daily for 10 days. Patient not taking: Reported on 02/21/2022 04/10/21 04/20/21  Thurnell Lose, MD  ondansetron (ZOFRAN) 4 MG tablet Take 1 tablet (4 mg total) by mouth every 8 (eight) hours as needed for  nausea. Patient not taking: Reported on 02/21/2022 04/10/21   Thurnell Lose, MD  pantoprazole (PROTONIX) 40 MG tablet Take 1 tablet (40 mg total) by mouth 2 (two) times daily. Patient not taking: Reported on 02/21/2022 04/10/21   Thurnell Lose, MD  sucralfate (CARAFATE) 1 GM/10ML suspension Take 10 mLs (1 g total) by mouth 4 (four) times daily -  with meals and at bedtime for 7 days. Patient not taking: Reported on 02/21/2022 04/10/21 04/17/21  Thurnell Lose, MD    Physical Exam: Vitals:   02/21/22 2330 02/21/22 2345 02/22/22 0000 02/22/22 0015  BP: (!) 144/104 (!) 143/99 (!) 146/99 (!) 142/94  Pulse:      Resp: (!) 7 (!) 21 16 15  $ Temp:      TempSrc:      SpO2:      Weight:      Height:       Constitutional: NAD, calm, comfortable, nontoxic appearing young middle-age male laying in bed asleep Eyes: lids and conjunctivae normal ENMT: Mucous membranes are moist.  Neck: normal, supple Respiratory: clear to auscultation bilaterally, no wheezing, no crackles. Normal respiratory effort. No accessory muscle use.  Cardiovascular: Regular rate and rhythm, no murmurs / rubs / gallops. No extremity edema. Right femoral HD catheter in place. Abdomen: soft, no tenderness, Bowel sounds positive.  Musculoskeletal: no clubbing / cyanosis. No joint deformity upper and lower extremities.  Normal muscle tone.  Skin: no rashes, lesions, ulcers. No induration Neurologic: CN 2-12 grossly intact.  Psychiatric: Normal judgment and insight. Alert and oriented x 3. Normal mood. Data Reviewed:  See HPI  Assessment and Plan: * Coag negative Staphylococcus bacteremia -Unfortunately we do not records of his previous blood cultures.  Blood cultures results were discussed between ED Dr. Tyrone Nine and nephrologist Dr. Candiss Norse. -Continue IV vancomycin-pharmacy is aware of his allergy and recommends Benadryl and rate reduction -Repeat blood cultures pending -Need IR consult tomorrow to arrange for catheter  removal  ESRD on dialysis [facility]) HD TTS. Last session of HD Saturday. Missed dialysis today with creatinine up to 13.5.  -will need to coordinate with nephrology and IR regarding dialysis session and removal of right femoral HD cath  Hypertension - Mildly elevated.  Patient does not appear to be on antihypertensives.  Will need dialysis tomorrow for missed session today.  Hypokalemia - Potassium of 3.4.  Will replete with oral potassium.      Advance Care Planning: Full  Consults: none  Family Communication: none at bedside  Severity of Illness: The appropriate patient status for this patient is INPATIENT. Inpatient status is judged to be reasonable and necessary in order to provide the required intensity of service to ensure the patient's safety. The patient's presenting symptoms, physical exam findings, and initial radiographic and laboratory data in the context of their chronic comorbidities is felt to place them at high risk for further clinical deterioration. Furthermore, it is not anticipated that the  patient will be medically stable for discharge from the hospital within 2 midnights of admission.   * I certify that at the point of admission it is my clinical judgment that the patient will require inpatient hospital care spanning beyond 2 midnights from the point of admission due to high intensity of service, high risk for further deterioration and high frequency of surveillance required.*  Author: Orene Desanctis, DO 02/22/2022 12:28 AM  For on call review [email or web address].

## 2022-02-21 NOTE — Assessment & Plan Note (Signed)
-   Potassium of 3.4.  Will replete with oral potassium.

## 2022-02-22 DIAGNOSIS — B957 Other staphylococcus as the cause of diseases classified elsewhere: Secondary | ICD-10-CM | POA: Diagnosis not present

## 2022-02-22 DIAGNOSIS — R7881 Bacteremia: Secondary | ICD-10-CM

## 2022-02-22 LAB — CBC
HCT: 26 % — ABNORMAL LOW (ref 39.0–52.0)
Hemoglobin: 8.2 g/dL — ABNORMAL LOW (ref 13.0–17.0)
MCH: 25.3 pg — ABNORMAL LOW (ref 26.0–34.0)
MCHC: 31.5 g/dL (ref 30.0–36.0)
MCV: 80.2 fL (ref 80.0–100.0)
Platelets: 109 10*3/uL — ABNORMAL LOW (ref 150–400)
RBC: 3.24 MIL/uL — ABNORMAL LOW (ref 4.22–5.81)
RDW: 16.9 % — ABNORMAL HIGH (ref 11.5–15.5)
WBC: 3.7 10*3/uL — ABNORMAL LOW (ref 4.0–10.5)
nRBC: 0 % (ref 0.0–0.2)

## 2022-02-22 LAB — BASIC METABOLIC PANEL
Anion gap: 16 — ABNORMAL HIGH (ref 5–15)
BUN: 40 mg/dL — ABNORMAL HIGH (ref 6–20)
CO2: 25 mmol/L (ref 22–32)
Calcium: 8.3 mg/dL — ABNORMAL LOW (ref 8.9–10.3)
Chloride: 95 mmol/L — ABNORMAL LOW (ref 98–111)
Creatinine, Ser: 13.96 mg/dL — ABNORMAL HIGH (ref 0.61–1.24)
GFR, Estimated: 4 mL/min — ABNORMAL LOW (ref >60)
Glucose, Bld: 75 mg/dL (ref 70–99)
Potassium: 3.8 mmol/L (ref 3.5–5.1)
Sodium: 136 mmol/L (ref 135–145)

## 2022-02-22 LAB — VANCOMYCIN, RANDOM: Vancomycin Rm: <4 ug/mL

## 2022-02-22 LAB — HEPATITIS B SURFACE ANTIGEN: Hepatitis B Surface Ag: NONREACTIVE

## 2022-02-22 MED ORDER — CINACALCET HCL 30 MG PO TABS: 30.0000 mg | ORAL_TABLET | ORAL | Status: DC

## 2022-02-22 MED ORDER — CHLORHEXIDINE GLUCONATE CLOTH 2 % EX PADS: 6.0000 | MEDICATED_PAD | Freq: Every day | CUTANEOUS | Status: DC

## 2022-02-22 MED ORDER — LIDOCAINE-PRILOCAINE 2.5-2.5 % EX CREA: 1.0000 | TOPICAL_CREAM | CUTANEOUS | Status: DC | PRN

## 2022-02-22 MED ORDER — PENTAFLUOROPROP-TETRAFLUOROETH EX AERO: 1.0000 | INHALATION_SPRAY | CUTANEOUS | Status: DC | PRN

## 2022-02-22 MED ORDER — CEFAZOLIN SODIUM-DEXTROSE 1-4 GM/50ML-% IV SOLN
1.0000 g | INTRAVENOUS | Status: DC
  Administered 2022-02-22: 1 g via INTRAVENOUS
  Filled 2022-02-22: qty 50

## 2022-02-22 MED ORDER — ALTEPLASE 2 MG IJ SOLR: 2.0000 mg | Freq: Once | INTRAMUSCULAR | Status: DC | PRN

## 2022-02-22 MED ORDER — DOXERCALCIFEROL 4 MCG/2ML IV SOLN
4.0000 ug | Freq: Once | INTRAVENOUS | Status: AC
  Administered 2022-02-22: 4 ug via INTRAVENOUS
  Filled 2022-02-22 (×2): qty 2

## 2022-02-22 MED ORDER — DIPHENHYDRAMINE HCL 50 MG/ML IJ SOLN: 12.5000 mg | Freq: Four times a day (QID) | INTRAMUSCULAR | Status: DC | PRN

## 2022-02-22 MED ORDER — HEPARIN SODIUM (PORCINE) 1000 UNIT/ML DIALYSIS: 1000.0000 [IU] | INTRAMUSCULAR | Status: DC | PRN

## 2022-02-22 MED ORDER — LIDOCAINE HCL (PF) 1 % IJ SOLN: 5.0000 mL | INTRAMUSCULAR | Status: DC | PRN

## 2022-02-22 NOTE — ED Notes (Signed)
Dialysis is sending transport now to take pt to dialysis. RN in 5W notified that pt will go to 5W after dialysis.

## 2022-02-22 NOTE — Consult Note (Signed)
[place] for Infectious Disease       Reason for Consult:Staph epidermidis bacteremia    Referring Physician: Dr. Cruzita Lederer  Principal Problem:   Coag negative Staphylococcus bacteremia Active Problems:   ESRD on dialysis [facility])   Hypertension   Hypokalemia    ferric citrate  630 mg Oral TID WC    Recommendations: Continue cefazolin for 14 days total  Assessment: He has MSSE in a blood culture.  I am unclear how many bottles or sets but know it is sensitive by report.  Could be a contaminate but will treat as above.      HPI: Nathan Castaneda is a 47 y.o. male with a history of ESRD on intermittent hemodialysis sent from [facility] for a line exchange which could not be done at [place].  He had blood cultures drawn on 2/6 at the dialysis center and when it came back positive, he was sent to the ED.  He reported only having chills, which were typical for him, no fever and no other symptoms. WBC 3.7.  He has no concerns now.     Review of Systems:  Constitutional: negative for fevers and chills All other systems reviewed and are negative    Past Medical History:  Diagnosis Date   Anemia    Anxiety    History of recent blood transfusion    Hypertension    Lupus ([place])    Renal disease     Social History   Tobacco Use   Smoking status: Every Day    Packs/day: 0.50    Years: 15.00    Total pack years: 7.50    Types: Cigarettes   Smokeless tobacco: Never  Vaping Use   Vaping Use: Never used  Substance Use Topics   Alcohol use: Never   Drug use: Never    History reviewed. No pertinent family history.  Allergies  Allergen Reactions   Vancomycin Itching    Physical Exam: Constitutional: in no apparent distress  Vitals:   02/22/22 0747 02/22/22 0800  BP:  (!) 138/102  Pulse:  69  Resp:  12  Temp: 97.9 F (36.6 C)   SpO2:  99%   EYES: anicteric Respiratory: normal respiratory effort Musculoskeletal: no edema  Lab Results  Component  Value Date   WBC 3.7 (L) 02/22/2022   HGB 8.2 (L) 02/22/2022   HCT 26.0 (L) 02/22/2022   MCV 80.2 02/22/2022   PLT 109 (L) 02/22/2022    Lab Results  Component Value Date   CREATININE 13.96 (H) 02/22/2022   BUN 40 (H) 02/22/2022   NA 136 02/22/2022   K 3.8 02/22/2022   CL 95 (L) 02/22/2022   CO2 25 02/22/2022    Lab Results  Component Value Date   ALT 5 02/21/2022   AST 9 (L) 02/21/2022   ALKPHOS 74 02/21/2022     Microbiology: Recent Results (from the past 240 hour(s))  Blood culture (routine x 2)     Status: None (Preliminary result)   Collection Time: 02/21/22  8:27 PM   Specimen: BLOOD  Result Value Ref Range Status   Specimen Description BLOOD SITE NOT SPECIFIED  Final   Special Requests   Final    BOTTLES DRAWN AEROBIC AND ANAEROBIC Blood Culture adequate volume   Culture   Final    NO GROWTH < 12 HOURS Performed at [facility], [address], [place], [place] [address]    Report Status PENDING  Incomplete  Blood  culture (routine x 2)     Status: None (Preliminary result)   Collection Time: 02/21/22  8:32 PM   Specimen: BLOOD  Result Value Ref Range Status   Specimen Description BLOOD BLOOD RIGHT ARM  Final   Special Requests   Final    BOTTLES DRAWN AEROBIC AND ANAEROBIC Blood Culture adequate volume   Culture   Final    NO GROWTH < 12 HOURS Performed at [facility], [address], [place], [place] [address]    Report Status PENDING  Incomplete    Thayer Headings, [place] for Infectious Disease [place] [email or web address] 02/22/2022, 10:01 AM

## 2022-02-22 NOTE — Progress Notes (Signed)
PROGRESS NOTE  Nathan Castaneda [id number] DOB: January 24, 1975 DOA: 02/21/2022 PCP: Justin Mend, MD   LOS: 1 day   Brief Narrative / Interim history: 47 year old male with ESRD on TTS, lupus, HTN, anemia of chronic disease who comes into the hospital with bacteremia from his HD catheter.  Apparently had blood cultures done as an outpatient which were positive for coag negative staph.  He initially presented to [facility], however they were unable to proceed with catheter exchange so he was sent over to [facility].  Apparently has failed bilateral upper extremity AV fistulas.  Culture showed pansensitive coag negative staph  Subjective / 24h Interval events: He has no complaints this morning, denies any chest pain, denies any shortness of breath.  He tells me he missed HD yesterday due to being in the hospital  Assesement and Plan: Principal Problem:   Coag negative Staphylococcus bacteremia Active Problems:   ESRD on dialysis [facility])   Hypertension   Hypokalemia   Principal problem Coag negative staph bacteremia -pansensitive per report.  ID consulted, appreciate input.  For now keep on antibiotics, duration to be determined by ID  Active problems ESRD on HD -nephrology consulted, last dialysis was on Saturday, missed HD yesterday  Essential hypertension -not on meds, monitor blood pressure  Hypokalemia -replete and recheck as indicated  Anemia of chronic kidney disease -hemoglobin stable, no bleeding  Thrombocytopenia -chronic.  No bleeding.  Continue to monitor  Scheduled Meds:  ferric citrate  630 mg Oral TID WC   Continuous Infusions:   ceFAZolin (ANCEF) IV     PRN Meds:.diphenhydrAMINE  Current Outpatient Medications  Medication Instructions   chlorproMAZINE (THORAZINE) 10 mg, Oral, 3 times daily PRN   ferric citrate (AURYXIA) 630 mg, Oral, 3 times daily   furosemide (LASIX) 80 mg, Daily   gabapentin (NEURONTIN) 100 mg, Oral, 3 times daily   LOKELMA 10  g PACK packet 1 packet, Oral, Daily   ondansetron (ZOFRAN) 4 mg, Oral, Every 8 hours PRN   pantoprazole (PROTONIX) 40 mg, Oral, 2 times daily   sucralfate (CARAFATE) 1 g, Oral, 3 times daily with meals & bedtime    Diet Orders (From admission, onward)     Start     Ordered   02/21/22 2345  Diet renal with fluid restriction Fluid restriction: 1200 mL Fluid; Room service appropriate? Yes; Fluid consistency: Thin  Diet effective now       Question Answer Comment  Fluid restriction: 1200 mL Fluid   Room service appropriate? Yes   Fluid consistency: Thin      02/21/22 2344            DVT prophylaxis: SCDs Start: 02/21/22 2344   Lab Results  Component Value Date   PLT 109 (L) 02/22/2022      Code Status: Full Code  Family Communication: no family at bedside   Status is: Inpatient  Remains inpatient appropriate because: bacteremia  Level of care: Telemetry Medical  Consultants:  ID Nephrology   Objective: Vitals:   02/22/22 0407 02/22/22 0705 02/22/22 0747 02/22/22 0800  BP: (!) 128/94 (!) 158/96  (!) 138/102  Pulse: 62 61  69  Resp: 16 11  12  $ Temp: 98.5 F (36.9 C)  97.9 F (36.6 C)   TempSrc: Oral  Oral   SpO2: 99% 99%  99%  Weight:      Height:       No intake or output data in the 24 hours ending 02/22/22 0857 Wt  Readings from Last 3 Encounters:  02/21/22 74.8 kg  04/09/21 63 kg  12/15/20 76 kg    Examination:  Constitutional: NAD Eyes: no scleral icterus ENMT: Mucous membranes are moist.  Neck: normal, supple Respiratory: clear to auscultation bilaterally, no wheezing, no crackles.  Cardiovascular: Regular rate and rhythm, no murmurs / rubs / gallops.  Abdomen: non distended, no tenderness. Bowel sounds positive.  Musculoskeletal: no clubbing / cyanosis.  Skin: no rashes   Data Reviewed: I have independently reviewed following labs and imaging studies   CBC Recent Labs  Lab 02/21/22 1702 02/22/22 0430  WBC 3.7* 3.7*  HGB 9.1* 8.2*   HCT 28.0* 26.0*  PLT 130* 109*  MCV 79.1* 80.2  MCH 25.7* 25.3*  MCHC 32.5 31.5  RDW 16.9* 16.9*  LYMPHSABS 1.3  --   MONOABS 0.3  --   EOSABS 0.1  --   BASOSABS 0.0  --     Recent Labs  Lab 02/21/22 1702 02/21/22 2055 02/21/22 2300 02/22/22 0430  NA 135  --   --  136  K 3.4*  --   --  3.8  CL 95*  --   --  95*  CO2 27  --   --  25  GLUCOSE 92  --   --  75  BUN 37*  --   --  40*  CREATININE 13.56*  --   --  13.96*  CALCIUM 8.4*  --   --  8.3*  AST 9*  --   --   --   ALT 5  --   --   --   ALKPHOS 74  --   --   --   BILITOT 0.4  --   --   --   ALBUMIN 2.6*  --   --   --   LATICACIDVEN  --  1.0 1.7  --     ------------------------------------------------------------------------------------------------------------------ No results for input(s): "CHOL", "HDL", "LDLCALC", "TRIG", "CHOLHDL", "LDLDIRECT" in the last 72 hours.  No results found for: "HGBA1C" ------------------------------------------------------------------------------------------------------------------ No results for input(s): "TSH", "T4TOTAL", "T3FREE", "THYROIDAB" in the last 72 hours.  Invalid input(s): "FREET3"  Cardiac Enzymes No results for input(s): "CKMB", "TROPONINI", "MYOGLOBIN" in the last 168 hours.  Invalid input(s): "CK" ------------------------------------------------------------------------------------------------------------------ No results found for: "BNP"  CBG: No results for input(s): "GLUCAP" in the last 168 hours.  Recent Results (from the past 240 hour(s))  Blood culture (routine x 2)     Status: None (Preliminary result)   Collection Time: 02/21/22  8:27 PM   Specimen: BLOOD  Result Value Ref Range Status   Specimen Description BLOOD SITE NOT SPECIFIED  Final   Special Requests   Final    BOTTLES DRAWN AEROBIC AND ANAEROBIC Blood Culture adequate volume   Culture   Final    NO GROWTH < 12 HOURS Performed at [facility], [address], [place], [place]  [address]    Report Status PENDING  Incomplete  Blood culture (routine x 2)     Status: None (Preliminary result)   Collection Time: 02/21/22  8:32 PM   Specimen: BLOOD  Result Value Ref Range Status   Specimen Description BLOOD BLOOD RIGHT ARM  Final   Special Requests   Final    BOTTLES DRAWN AEROBIC AND ANAEROBIC Blood Culture adequate volume   Culture   Final    NO GROWTH < 12 HOURS Performed at [facility], [place] [address], [place], [place] [address]    Report  Status PENDING  Incomplete     Radiology Studies: No results found.   Marzetta Board, MD, PhD [facility]  Between 7 am - 7 pm I am available, please contact me via Amion (for emergencies) or Securechat (non urgent messages)  Between 7 pm - 7 am I am not available, please contact night coverage MD/APP via Amion

## 2022-02-22 NOTE — Progress Notes (Signed)
Patient has arrived on floor, V/S take, bed alarm on and safety ensured.

## 2022-02-22 NOTE — ED Notes (Signed)
   02/22/22 1902  Vitals  Temp 97.9 F (36.6 C)  Temp Source Oral  BP 133/86  MAP (mmHg) 99  BP Location Right Arm  BP Method Automatic  Patient Position (if appropriate) Lying  Pulse Rate 70  Resp 17  Oxygen Therapy  SpO2 94 %  O2 Device Room Air  Patient Activity (if Appropriate) In bed  Pulse Oximetry Type Continuous  During Treatment Monitoring  Intra-Hemodialysis Comments Tx completed;Tolerated well  Post Treatment  Dialyzer Clearance Lightly streaked  Duration of HD Treatment -hour(s) 3 hour(s)  Liters Processed 72  Fluid Removed (mL) 1500 mL  Tolerated HD Treatment Yes  Hemodialysis Catheter Right Femoral vein Double lumen Permanent (Tunneled)  Placement Date/Time: 09/23/21 1025   Serial / Lot #: [id number]  Expiration Date: 12/21/25  Time Out: Correct patient;Correct site;Correct procedure  Maximum sterile barrier precautions: Hand hygiene;Cap;Mask;Sterile gown;Sterile gloves;Large sterile s...  Site Condition No complications  Blue Lumen Status Dead end cap in place;Heparin locked  Red Lumen Status Dead end cap in place;Heparin locked  Purple Lumen Status N/A  Catheter fill solution Heparin 1000 units/ml  Catheter fill volume (Arterial) 2.6 cc  Catheter fill volume (Venous) 2.6  Dressing Type Transparent  Dressing Status Antimicrobial disc in place;Clean, Dry, Intact  Interventions New dressing  Drainage Description None  Dressing Change Due 03/01/22  Post treatment catheter status Capped and Clamped

## 2022-02-22 NOTE — ED Notes (Signed)
ED TO INPATIENT HANDOFF REPORT  ED Nurse Name and Phone #: Anette Guarneri [phone]  S Name/Age/Gender Nathan Castaneda 47 y.o. male Room/Bed: 006C/006C  Code Status   Code Status: Full Code  Home/SNF/Other Home Patient oriented to: self, place, time, and situation Is this baseline? Yes   Triage Complete: Triage complete  Chief Complaint Bacteremia [R78.81]  Triage Note Pt states that he needs dialysis catheter exchange due to infection reported by dialysis center. Pt states he was seen by [facility] ER and was supposed to be transferred to [facility] but it was "backed up for a couple weeks" so they advised him to come POV.  Dialysis T, Th, S. Pt states he received full treatment and antibiotics on Saturday.   Allergies Allergies  Allergen Reactions   Vancomycin Itching    Level of Care/Admitting Diagnosis ED Disposition     ED Disposition  Admit   Condition  --   Comment  H[facility]: [facility] [[id number]]  Level of Care: Telemetry Medical [104]  May admit patient to [facility] or [facility] if equivalent level of care is available:: No  Covid Evaluation: Asymptomatic - no recent exposure (last 10 days) testing not required  Diagnosis: Bacteremia [790.7.ICD-9-CM]  Admitting Physician: Orene Desanctis [id number]  Attending Physician: Orene Desanctis [id number]  Certification:: I certify this patient will need inpatient services for at least 2 midnights  Estimated Length of Stay: 3          B Medical/Surgery History Past Medical History:  Diagnosis Date   Anemia    Anxiety    History of recent blood transfusion    Hypertension    Lupus ([place])    Renal disease    Past Surgical History:  Procedure Laterality Date   AV FISTULA PLACEMENT     ESOPHAGOGASTRODUODENOSCOPY (EGD) WITH PROPOFOL N/A 04/08/2021   Procedure: ESOPHAGOGASTRODUODENOSCOPY (EGD) WITH PROPOFOL;  Surgeon: Carol Ada, MD;  Location: [place];  Service: Gastroenterology;   Laterality: N/A;   IR FLUORO GUIDE CV LINE RIGHT  09/23/2021   PERITONEAL CATHETER INSERTION     REVISON OF ARTERIOVENOUS FISTULA Right 09/12/2019   Procedure: 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 artery with reversed saphenous vein from left ankle;  Surgeon: Rosetta Posner, MD;  Location: [facility] OR;  Service: Vascular;  Laterality: Right;   VEIN HARVEST Left 09/12/2019   Procedure: SAPHENOUS VEIN HARVEST;  Surgeon: Rosetta Posner, MD;  Location: [facility] OR;  Service: Vascular;  Laterality: Left;   WOUND DEBRIDEMENT Right 09/26/2019   Procedure: RIGHT UPPER EXTREMITY WOUND WASHOUT;  Surgeon: Rosetta Posner, MD;  Location[facility] OR;  Service: Vascular;  Laterality: Right;     A IV Location/Drains/Wounds Patient Lines/Drains/Airways Status     Active Line/Drains/Airways     Name Placement date Placement time Site Days   Peripheral IV 02/21/22 20 G 1.88" Right;Anterior Forearm 02/21/22  2146  Forearm  1   Hemodialysis Catheter Right Femoral vein --  --  Femoral vein  --   Hemodialysis Catheter Right Femoral vein Double lumen Permanent (Tunneled) 09/23/21  1025  Femoral vein  152            Intake/Output Last 24 hours No intake or output data in the 24 hours ending 02/22/22 1250  Labs/Imaging Results for orders placed or performed during the hospital encounter of 02/21/22 (from the past 48 hour(s))  Comprehensive metabolic panel     Status: Abnormal   Collection Time:  02/21/22  5:02 PM  Result Value Ref Range   Sodium 135 135 - 145 mmol/L   Potassium 3.4 (L) 3.5 - 5.1 mmol/L   Chloride 95 (L) 98 - 111 mmol/L   CO2 27 22 - 32 mmol/L   Glucose, Bld 92 70 - 99 mg/dL    Comment: Glucose reference range applies only to samples taken after fasting for at least 8 hours.   BUN 37 (H) 6 - 20 mg/dL   Creatinine, Ser 13.56 (H) 0.61 - 1.24 mg/dL   Calcium 8.4 (L) 8.9 - 10.3 mg/dL   Total Protein 6.6 6.5 - 8.1 g/dL   Albumin 2.6 (L) 3.5 - 5.0 g/dL   AST 9 (L) 15 - 41 U/L    ALT 5 0 - 44 U/L   Alkaline Phosphatase 74 38 - 126 U/L   Total Bilirubin 0.4 0.3 - 1.2 mg/dL   GFR, Estimated 4 (L) >60 mL/min    Comment: (NOTE) Calculated using the CKD-EPI Creatinine Equation (2021)    Anion gap 13 5 - 15    Comment: Performed at [facility] [address] [place], [place] [address]  CBC with Differential     Status: Abnormal   Collection Time: 02/21/22  5:02 PM  Result Value Ref Range   WBC 3.7 (L) 4.0 - 10.5 K/uL   RBC 3.54 (L) 4.22 - 5.81 MIL/uL   Hemoglobin 9.1 (L) 13.0 - 17.0 g/dL   HCT 28.0 (L) 39.0 - 52.0 %   MCV 79.1 (L) 80.0 - 100.0 fL   MCH 25.7 (L) 26.0 - 34.0 pg   MCHC 32.5 30.0 - 36.0 g/dL   RDW 16.9 (H) 11.5 - 15.5 %   Platelets 130 (L) 150 - 400 K/uL   nRBC 0.0 0.0 - 0.2 %   Neutrophils Relative % 53 %   Neutro Abs 2.0 1.7 - 7.7 K/uL   Lymphocytes Relative 35 %   Lymphs Abs 1.3 0.7 - 4.0 K/uL   Monocytes Relative 9 %   Monocytes Absolute 0.3 0.1 - 1.0 K/uL   Eosinophils Relative 2 %   Eosinophils Absolute 0.1 0.0 - 0.5 K/uL   Basophils Relative 0 %   Basophils Absolute 0.0 0.0 - 0.1 K/uL   Immature Granulocytes 1 %   Abs Immature Granulocytes 0.02 0.00 - 0.07 K/uL    Comment: Performed at [facility], [place] [address], [place], [place] [address]  Blood culture (routine x 2)     Status: None (Preliminary result)   Collection Time: 02/21/22  8:27 PM   Specimen: BLOOD  Result Value Ref Range   Specimen Description BLOOD SITE NOT SPECIFIED    Special Requests      BOTTLES DRAWN AEROBIC AND ANAEROBIC Blood Culture adequate volume   Culture      NO GROWTH < 12 HOURS Performed at [facility], [place] [address] [place], [place] [address]    Report Status PENDING   Blood culture (routine x 2)     Status: None (Preliminary result)   Collection Time: 02/21/22  8:32 PM   Specimen: BLOOD  Result Value Ref Range   Specimen Description BLOOD BLOOD RIGHT ARM    Special Requests      BOTTLES DRAWN AEROBIC AND ANAEROBIC Blood  Culture adequate volume   Culture      NO GROWTH < 12 HOURS Performed at [facility], [place] [address] [place], [place] [address]    Report Status  PENDING   Lactic acid, plasma     Status: None   Collection Time: 02/21/22  8:55 PM  Result Value Ref Range   Lactic Acid, Venous 1.0 0.5 - 1.9 mmol/L    Comment: Performed at [facility], [place] [address], [place], [place] [address]  Vancomycin, random     Status: None   Collection Time: 02/21/22  9:50 PM  Result Value Ref Range   Vancomycin Rm <4 ug/mL    Comment: CORRECTED RESULTS CALLED TO: Seana Underwood,K RN @ 1218 02/22/22 LEONARD,A        Random Vancomycin therapeutic range is dependent on dosage and time of specimen collection. A peak range is 20-40 ug/mL A trough range is 5-15 ug/mL        RESULTS CONFIRMED BY MANUAL DILUTION Performed at [facility] [address] [place], [place] [address] CORRECTED ON 02/14 AT 1219: PREVIOUSLY REPORTED AS <0 RESULT CONFIRMED BY MANUAL DILUTION        Random Vancomycin therapeutic range is dependent on dosage and time of specimen collection. A peak range is 20 40 ug/mL A trough range is 5 15 ug/mL          Lactic acid, plasma     Status: None   Collection Time: 02/21/22 11:00 PM  Result Value Ref Range   Lactic Acid, Venous 1.7 0.5 - 1.9 mmol/L    Comment: Performed at [facility] [address] [place], [place] [address]  CBC     Status: Abnormal   Collection Time: 02/22/22  4:30 AM  Result Value Ref Range   WBC 3.7 (L) 4.0 - 10.5 K/uL   RBC 3.24 (L) 4.22 - 5.81 MIL/uL   Hemoglobin 8.2 (L) 13.0 - 17.0 g/dL   HCT 26.0 (L) 39.0 - 52.0 %   MCV 80.2 80.0 - 100.0 fL   MCH 25.3 (L) 26.0 - 34.0 pg   MCHC 31.5 30.0 - 36.0 g/dL   RDW 16.9 (H) 11.5 - 15.5 %   Platelets 109 (L) 150 - 400 K/uL    Comment: REPEATED TO VERIFY   nRBC 0.0 0.0 - 0.2 %    Comment: Performed at [facility], [place] [address], [place], [place] [id number]  Basic metabolic panel     Status:  Abnormal   Collection Time: 02/22/22  4:30 AM  Result Value Ref Range   Sodium 136 135 - 145 mmol/L   Potassium 3.8 3.5 - 5.1 mmol/L   Chloride 95 (L) 98 - 111 mmol/L   CO2 25 22 - 32 mmol/L   Glucose, Bld 75 70 - 99 mg/dL    Comment: Glucose reference range applies only to samples taken after fasting for at least 8 hours.   BUN 40 (H) 6 - 20 mg/dL   Creatinine, Ser 13.96 (H) 0.61 - 1.24 mg/dL   Calcium 8.3 (L) 8.9 - 10.3 mg/dL   GFR, Estimated 4 (L) >60 mL/min    Comment: (NOTE) Calculated using the CKD-EPI Creatinine Equation (2021)    Anion gap 16 (H) 5 - 15    Comment: Performed at [facility] [address] [place], [place] [address]   No results found.  Pending Labs Unresulted Labs (From admission, onward)     Start     Ordered   02/22/22 1135  Hepatitis B surface antigen  (New Admission Hemo Labs (Hepatitis B))  ONCE - URGENT,   URGENT  Question:  Specimen collection method  Answer:  IV Team=IV Team collect   02/22/22 1134   02/22/22 1135  Hepatitis B surface antibody,quantitative  (New Admission Hemo Labs (Hepatitis B))  ONCE - URGENT,   URGENT       Question:  Specimen collection method  Answer:  IV Team=IV Team collect   02/22/22 1134            Vitals/Pain Today's Vitals   02/22/22 1030 02/22/22 1115 02/22/22 1128 02/22/22 1200  BP: 121/86 (!) 133/91  113/70  Pulse: (!) 59 65  62  Resp: 16 10  18  $ Temp:   98.1 F (36.7 C)   TempSrc:   Oral   SpO2: 99% 99%  99%  Weight:      Height:      PainSc:        Isolation Precautions No active isolations  Medications Medications  ferric citrate (AURYXIA) tablet 630 mg (630 mg Oral Given 02/22/22 1107)  diphenhydrAMINE (BENADRYL) injection 12.5 mg (has no administration in time range)  ceFAZolin (ANCEF) IVPB 1 g/50 mL premix (has no administration in time range)  Chlorhexidine Gluconate Cloth 2 % PADS 6 each (has no administration in time range)  pentafluoroprop-tetrafluoroeth (GEBAUERS)  aerosol 1 Application (has no administration in time range)  lidocaine (PF) (XYLOCAINE) 1 % injection 5 mL (has no administration in time range)  lidocaine-prilocaine (EMLA) cream 1 Application (has no administration in time range)  heparin injection 1,000 Units (has no administration in time range)  alteplase (CATHFLO ACTIVASE) injection 2 mg (has no administration in time range)  cinacalcet (SENSIPAR) tablet 30 mg (has no administration in time range)  doxercalciferol (HECTOROL) injection 4 mcg (has no administration in time range)  vancomycin (VANCOREADY) IVPB 1500 mg/300 mL (0 mg Intravenous Stopped 02/22/22 0700)  diphenhydrAMINE (BENADRYL) capsule 25 mg (25 mg Oral Given 02/21/22 2319)  potassium chloride SA (KLOR-CON M) CR tablet 40 mEq (40 mEq Oral Given 02/22/22 0001)    Mobility walks     Focused Assessments Neuro Assessment Handoff:  Swallow screen pass? Yes          Neuro Assessment:   Neuro Checks:      Has TPA been given? No If patient is a Neuro Trauma and patient is going to OR before floor call report to [facility] nurse: [phone] or [phone]   R Recommendations: See Admitting Provider Note  Report given to:   Additional Notes:

## 2022-02-22 NOTE — ED Notes (Signed)
To dialysis  now

## 2022-02-22 NOTE — Consult Note (Addendum)
[facility] Renal Consultation Note    Indication for Consultation:  Management of ESRD/hemodialysis; anemia, hypertension/volume and secondary hyperparathyroidism  [id number], Jonna Coup, MD  HPI: Nathan Castaneda is a 47 y.o. male with ESRD on HD TTS at [facility]. He has a past medical history significant for lupus, HTN, SBO, anemia of chronic disease, and GERD who is a transfer from [facility] for bacteremia of R thigh TDC catheter.   Blood cultures X 2 obtained from outpatient HD center from 2/6 tested positive for coag negative staph. He received loading dose Fortax on 2/10 which was his last HD treatment. Patient with longstanding history of HD non-compliance. Seen and examined patient at bedside. He reports not receiving HD yesterday. He reports feeling fine and denies fevers, chills, SOB, CP, and N/V. He's on RA, afebrile, and blood pressures are stable. Recent labs are also stable. Blood cultures X 2 obtained yesterday and awaiting results. ID on board and IV Vanc and Ancef ordered. IR consulted. Plan for HD this afternoon then [facility] removal for line holiday. Goal for new [facility] placement for either Friday or Saturday this week.  Past Medical History:  Diagnosis Date   Anemia    Anxiety    History of recent blood transfusion    Hypertension    Lupus ([place])    Renal disease    Past Surgical History:  Procedure Laterality Date   AV FISTULA PLACEMENT     ESOPHAGOGASTRODUODENOSCOPY (EGD) WITH PROPOFOL N/A 04/08/2021   Procedure: ESOPHAGOGASTRODUODENOSCOPY (EGD) WITH PROPOFOL;  Surgeon: Carol Ada, MD;  Location: [place];  Service: Gastroenterology;  Laterality: N/A;   IR FLUORO GUIDE CV LINE RIGHT  09/23/2021   PERITONEAL CATHETER INSERTION     REVISON OF ARTERIOVENOUS FISTULA Right 09/12/2019   Procedure: 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 artery with reversed saphenous vein from left ankle;  Surgeon: Rosetta Posner, MD;   Location: [facility] OR;  Service: Vascular;  Laterality: Right;   VEIN HARVEST Left 09/12/2019   Procedure: SAPHENOUS VEIN HARVEST;  Surgeon: Rosetta Posner, MD;  Location: [facility] OR;  Service: Vascular;  Laterality: Left;   WOUND DEBRIDEMENT Right 09/26/2019   Procedure: RIGHT UPPER EXTREMITY WOUND WASHOUT;  Surgeon: Rosetta Posner, MD;  Location: [place];  Service: Vascular;  Laterality: Right;   History reviewed. No pertinent family history. Social History:  reports that he has been smoking cigarettes. He has a 7.50 pack-year smoking history. He has never used smokeless tobacco. He reports that he does not drink alcohol and does not use drugs. Allergies  Allergen Reactions   Vancomycin Itching   Prior to Admission medications   Medication Sig Start Date End Date Taking? Authorizing Provider  ferric citrate (AURYXIA) 1 GM 210 MG(Fe) tablet Take 630 mg by mouth in the morning, at noon, and at bedtime.   Yes [provider]  LOKELMA 10 g PACK packet Take 1 packet by mouth daily.   Yes [provider]  chlorproMAZINE (THORAZINE) 10 MG tablet Take 1 tablet (10 mg total) by mouth 3 (three) times daily as needed for hiccoughs. Patient not taking: Reported on 02/21/2022 04/10/21   Thurnell Lose, MD  furosemide (LASIX) 80 MG tablet Take 80 mg by mouth daily. Patient not taking: Reported on 02/21/2022 02/04/18   [provider]  gabapentin (NEURONTIN) 100 MG capsule Take 1 capsule (100 mg total) by mouth 3 (three) times daily for 10 days. Patient not taking: Reported on 02/21/2022  04/10/21 04/20/21  Thurnell Lose, MD  ondansetron (ZOFRAN) 4 MG tablet Take 1 tablet (4 mg total) by mouth every 8 (eight) hours as needed for nausea. Patient not taking: Reported on 02/21/2022 04/10/21   Thurnell Lose, MD  pantoprazole (PROTONIX) 40 MG tablet Take 1 tablet (40 mg total) by mouth 2 (two) times daily. Patient not taking: Reported on 02/21/2022 04/10/21   Thurnell Lose, MD  sucralfate  (CARAFATE) 1 GM/10ML suspension Take 10 mLs (1 g total) by mouth 4 (four) times daily -  with meals and at bedtime for 7 days. Patient not taking: Reported on 02/21/2022 04/10/21 04/17/21  Thurnell Lose, MD   Current Facility-Administered Medications  Medication Dose Route Frequency Provider Last Rate Last Admin   alteplase (CATHFLO ACTIVASE) injection 2 mg  2 mg Intracatheter Once PRN Adelfa Koh, NP       ceFAZolin (ANCEF) IVPB 1 g/50 mL premix  1 g Intravenous Q24H Thayer Headings, MD       Chlorhexidine Gluconate Cloth 2 % PADS 6 each  6 each Topical Q0600 Adelfa Koh, NP       diphenhydrAMINE (BENADRYL) injection 12.5 mg  12.5 mg Intravenous Q6H PRN Tu, Ching T, DO       ferric citrate (AURYXIA) tablet 630 mg  630 mg Oral TID WC Tu, Ching T, DO   630 mg at 02/22/22 1107   heparin injection 1,000 Units  1,000 Units Dialysis PRN Adelfa Koh, NP       lidocaine (PF) (XYLOCAINE) 1 % injection 5 mL  5 mL Intradermal PRN Adelfa Koh, NP       lidocaine-prilocaine (EMLA) cream 1 Application  1 Application Topical PRN Adelfa Koh, NP       pentafluoroprop-tetrafluoroeth (GEBAUERS) aerosol 1 Application  1 Application Topical PRN Adelfa Koh, NP       Current Outpatient Medications  Medication Sig Dispense Refill   ferric citrate (AURYXIA) 1 GM 210 MG(Fe) tablet Take 630 mg by mouth in the morning, at noon, and at bedtime.     LOKELMA 10 g PACK packet Take 1 packet by mouth daily.     chlorproMAZINE (THORAZINE) 10 MG tablet Take 1 tablet (10 mg total) by mouth 3 (three) times daily as needed for hiccoughs. (Patient not taking: Reported on 02/21/2022) 20 tablet 0   furosemide (LASIX) 80 MG tablet Take 80 mg by mouth daily. (Patient not taking: Reported on 02/21/2022)     gabapentin (NEURONTIN) 100 MG capsule Take 1 capsule (100 mg total) by mouth 3 (three) times daily for 10 days. (Patient not taking: Reported on 02/21/2022) 30 capsule 0    ondansetron (ZOFRAN) 4 MG tablet Take 1 tablet (4 mg total) by mouth every 8 (eight) hours as needed for nausea. (Patient not taking: Reported on 02/21/2022) 20 tablet 0   pantoprazole (PROTONIX) 40 MG tablet Take 1 tablet (40 mg total) by mouth 2 (two) times daily. (Patient not taking: Reported on 02/21/2022) 60 tablet 0   sucralfate (CARAFATE) 1 GM/10ML suspension Take 10 mLs (1 g total) by mouth 4 (four) times daily -  with meals and at bedtime for 7 days. (Patient not taking: Reported on 02/21/2022) 280 mL 0   Labs: Basic Metabolic Panel: Recent Labs  Lab 02/21/22 1702 02/22/22 0430  NA 135 136  K 3.4* 3.8  CL 95* 95*  CO2 27 25  GLUCOSE 92 75  BUN 37* 40*  CREATININE 13.56* 13.96*  CALCIUM 8.4* 8.3*   Liver Function Tests: Recent Labs  Lab 02/21/22 1702  AST 9*  ALT 5  ALKPHOS 74  BILITOT 0.4  PROT 6.6  ALBUMIN 2.6*   No results for input(s): "LIPASE", "AMYLASE" in the last 168 hours. No results for input(s): "AMMONIA" in the last 168 hours. CBC: Recent Labs  Lab 02/21/22 1702 02/22/22 0430  WBC 3.7* 3.7*  NEUTROABS 2.0  --   HGB 9.1* 8.2*  HCT 28.0* 26.0*  MCV 79.1* 80.2  PLT 130* 109*   Cardiac Enzymes: No results for input(s): "CKTOTAL", "CKMB", "CKMBINDEX", "TROPONINI" in the last 168 hours. CBG: No results for input(s): "GLUCAP" in the last 168 hours. Iron Studies: No results for input(s): "IRON", "TIBC", "TRANSFERRIN", "FERRITIN" in the last 72 hours. Studies/Results: No results found.  ROS: All others negative except those listed in HPI.  Physical Exam: Vitals:   02/22/22 0800 02/22/22 1030 02/22/22 1115 02/22/22 1128  BP: (!) 138/102 121/86 (!) 133/91   Pulse: 69 (!) 59 65   Resp: 12 16 10   $ Temp:    98.1 F (36.7 C)  TempSrc:    Oral  SpO2: 99% 99% 99%   Weight:      Height:         General: Awake, alert, on RA, NAD Lungs: CTA bilaterally. No wheeze, rales or rhonchi. Breathing is unlabored. Heart: RRR. No murmur, rubs or gallops.   Abdomen: soft and non-tender Lower extremities: no LE edema Dialysis Access: R thigh TDC  Dialysis Orders:  TTS - [facility] 4hrs 58mn, BFR 400, DFR 800,  EDW 73.5kg, 2K/ 2.5Ca No heparin bolus ordered Mircera 100 mcg q2wks - last 02/14/22 Hectorol 439m IV qHD - last 02/18/22 Sensipar 3074mith HD - last 02/18/22 Home meds: Auryxia 210m59m tabs TID with meals and 3 tabs with snacks; Lokelma 10GM daily; Sildenafil 100mg52mly PRN  Last Labs: SrCr 13.96, BUN 40, Na 136, Hgb 8.2, K 3.8, Ca 8.3,  Alb 2.6  Assessment/Plan: Coag negative staph bacteremia - Blood cx obtained 2/13 and ID on board, on IV Vanc and Ancef. Plan [facility] by IR AFTER HD this afternoon. Discussed with IR PA: depending on when patient finishes HD today, TDC may be removed tomorrow morning. [facility]. ESRD - on HD TTS. Off schedule. Plan for HD today. See above.  Hypertension/volume  - Bps controlled and euvolemic on exam. Monitor. Anemia of CKD - Hgb 8.2, no Fe since he is on ABX for current bacteremia. ESA not due yet. Secondary Hyperparathyroidism - Will continue binders, VDRA, amd Sensipar. PO4 trending in 8s in outpatient. Checking labs in AM Nutrition - Renal with fluid restriction  CourtTobie Po[facility]Carol[facility] 02/22/2022, 12:01 PM

## 2022-02-23 ENCOUNTER — Inpatient Hospital Stay (HOSPITAL_COMMUNITY): Payer: 59

## 2022-02-23 DIAGNOSIS — B957 Other staphylococcus as the cause of diseases classified elsewhere: Secondary | ICD-10-CM | POA: Diagnosis not present

## 2022-02-23 DIAGNOSIS — R7881 Bacteremia: Secondary | ICD-10-CM | POA: Diagnosis not present

## 2022-02-23 HISTORY — PX: IR FLUORO GUIDE CV LINE RIGHT: IMG2283

## 2022-02-23 LAB — HEPATITIS B SURFACE ANTIBODY, QUANTITATIVE: Hep B S AB Quant (Post): 49.3 m[IU]/mL (ref >9.9)

## 2022-02-23 MED ORDER — CEFAZOLIN SODIUM-DEXTROSE 1-4 GM/50ML-% IV SOLN
1.0000 g | INTRAVENOUS | Status: AC
  Administered 2022-02-23: 1 g via INTRAVENOUS
  Filled 2022-02-23: qty 50

## 2022-02-23 MED ORDER — CEFAZOLIN SODIUM-DEXTROSE 2-4 GM/100ML-% IV SOLN
2.0000 g | Freq: Once | INTRAVENOUS | Status: AC
  Administered 2022-02-23: 2 g via INTRAVENOUS
  Filled 2022-02-23: qty 100

## 2022-02-23 MED ORDER — HEPARIN SODIUM (PORCINE) 1000 UNIT/ML IJ SOLN
INTRAMUSCULAR | Status: AC
  Administered 2022-02-23: 5.2 mL
  Filled 2022-02-23: qty 10

## 2022-02-23 MED ORDER — CEFAZOLIN IV (FOR PTA / DISCHARGE USE ONLY): 2.0000 g | INTRAVENOUS | 0 refills | Status: AC

## 2022-02-23 MED ORDER — LIDOCAINE HCL 1 % IJ SOLN
INTRAMUSCULAR | Status: AC
  Administered 2022-02-23: 10 mL
  Filled 2022-02-23: qty 20

## 2022-02-23 MED ORDER — CEFAZOLIN SODIUM-DEXTROSE 2-4 GM/100ML-% IV SOLN: 2.0000 g | INTRAVENOUS | Status: DC

## 2022-02-23 MED ORDER — CHLORHEXIDINE GLUCONATE 4 % EX LIQD
CUTANEOUS | Status: AC
  Filled 2022-02-23: qty 15

## 2022-02-23 NOTE — Progress Notes (Signed)
PHARMACY CONSULT NOTE FOR:  OUTPATIENT  PARENTERAL ANTIBIOTIC THERAPY (OPAT)  Informational only as the plan is for the patient to receive antibiotics at his outpatient HD center. Regimen and stop date communicated to the nephrology team.   Indication: MSSE bacteremia Regimen: Cefazolin 2g/HD-TTS End date: 03/08/22  IV antibiotic discharge orders are pended. To discharging provider:  please sign these orders via discharge navigator,  Select New Orders & click on the button choice - Manage This Unsigned Work.     Thank you for allowing pharmacy to be a part of this patient's care.  Alycia Rossetti, PharmD, BCPS Infectious Diseases Clinical Pharmacist 02/23/2022 9:44 AM   **Pharmacist phone directory can now be found on [email or web address] ([email or web address]).  Listed under [place].

## 2022-02-23 NOTE — Progress Notes (Signed)
    [facility] for Infectious Disease   Reason for visit: Follow up on bacteremia  Interval History: s/p TDC exchange Cefazolin day 2  Physical Exam: Constitutional:  Vitals:   02/23/22 0800 02/23/22 0831  BP:  (!) 148/101  Pulse:  75  Resp:  17  Temp:  98.6 F (37 C)  SpO2: 90% 96%   patient appears in NAD Respiratory: Normal respiratory effort  Review of Systems: Constitutional: negative for fevers and chills  Lab Results  Component Value Date   WBC 3.7 (L) 02/22/2022   HGB 8.2 (L) 02/22/2022   HCT 26.0 (L) 02/22/2022   MCV 80.2 02/22/2022   PLT 109 (L) 02/22/2022    Lab Results  Component Value Date   CREATININE 13.96 (H) 02/22/2022   BUN 40 (H) 02/22/2022   NA 136 02/22/2022   K 3.8 02/22/2022   CL 95 (L) 02/22/2022   CO2 25 02/22/2022    Lab Results  Component Value Date   ALT 5 02/21/2022   AST 9 (L) 02/21/2022   ALKPHOS 74 02/21/2022     Microbiology: Recent Results (from the past 240 hour(s))  Blood culture (routine x 2)     Status: None (Preliminary result)   Collection Time: 02/21/22  8:27 PM   Specimen: BLOOD  Result Value Ref Range Status   Specimen Description BLOOD SITE NOT SPECIFIED  Final   Special Requests   Final    BOTTLES DRAWN AEROBIC AND ANAEROBIC Blood Culture adequate volume   Culture   Final    NO GROWTH 2 DAYS Performed at [facility], [address] [place], [place] [address]    Report Status PENDING  Incomplete  Blood culture (routine x 2)     Status: None (Preliminary result)   Collection Time: 02/21/22  8:32 PM   Specimen: BLOOD  Result Value Ref Range Status   Specimen Description BLOOD BLOOD RIGHT ARM  Final   Special Requests   Final    BOTTLES DRAWN AEROBIC AND ANAEROBIC Blood Culture adequate volume   Culture   Final    NO GROWTH 2 DAYS Performed at [facility], [place] [address] [place], [place] [address]    Report Status PENDING  Incomplete    Impression/Plan:  1. Bacteremia with MSSE -  on cefazolin and now s/p line exchange. Repeat blood cultures ngtd.  I recommend treatment x 14 days with cefazolin 2 grams on dialysis days.    2.  ESRD - s/p catheter exchange.  Poor options and catheter remains in the right femoral.  Treatment as above.   3.  Disposition - stable for discharge from ID standpoint.

## 2022-02-23 NOTE — Progress Notes (Signed)
D/C order noted. Contacted [facility] to make clinic aware of pt's d/c today and that pt should resume care on Saturday. Clinic advised renal NP will send orders for iv abx.   Melven Sartorius Renal Navigator [phone]

## 2022-02-23 NOTE — Addendum Note (Signed)
Encounter addended by: Betsey Holiday on: 02/23/2022 7:38 AM  Actions taken: Imaging Exam ended

## 2022-02-23 NOTE — Procedures (Signed)
Interventional Radiology Procedure Note  Procedure: RT FEM TUNNELED HD CATH EXCHG    Complications: None  Estimated Blood Loss:  MIN  Findings: TIP IVC RA JUNCTION    Tamera Punt, MD

## 2022-02-23 NOTE — Discharge Summary (Signed)
Physician Discharge Summary  Nathan Castaneda [id number] DOB: 1975/07/31 DOA: 02/21/2022  PCP: Justin Mend, MD  Admit date: 02/21/2022 Discharge date: 02/23/2022  Admitted From: home Disposition:  home  Recommendations for Outpatient Follow-up:  Follow up with PCP in 1-2 weeks Continue cefazolin with HD as below for 2 total weeks, with end date 03/08/2022  Home Health: None Equipment/Devices: None  Discharge Condition: Stable CODE STATUS: Full code Diet Orders (From admission, onward)     Start     Ordered   02/23/22 0730  Diet regular Room service appropriate? Yes; Fluid consistency: Thin  Diet effective now       Question Answer Comment  Room service appropriate? Yes   Fluid consistency: Thin      02/23/22 0729            HPI: Per admitting MD, Nathan Castaneda is a 47 y.o. male with medical history significant of ESRD TTS, lupus, HTN, SBO, anemia of chronic disease, GERD who presents bacteremia from HD catheter.  Patient was initially seen at [facility] yesterday 2/12 after being sent there by nephrology for bacteremia. They are unable to proceed with cath exchange at [facility] so both nephrology and IR at [facility] were consulted and agree with transfer. Pt was meant to be a direct admission from [facility] yesterday but left and came directly to [facility] ED today due to long wait time. He remains asymptomatic. Denies any pain or drainage from right LE HD cath site. No fevers or chills. Last session of HD Saturday. Has hx of failed bilateral UE AV fistulas.   Hospital Course / Discharge diagnoses: Principal Problem:   Coag negative Staphylococcus bacteremia Active Problems:   ESRD on dialysis ([place])   Hypertension   Hypokalemia   Principal problem Coag negative staph bacteremia -pansensitive per report.  ID consulted, appreciate input, not clear whether true infection versus contaminant, but they do recommend 2 weeks of treatment.  His HD line has been  exchanged by IR.  He will be placed on cefazolin with HD for 2 additional weeks through the end of February   Active problems ESRD on HD -nephrology consulted, last dialysis 2/14  Essential hypertension -not on meds, monitor blood pressure Hypokalemia -replete and recheck as indicated Anemia of chronic kidney disease -hemoglobin stable, no bleeding Thrombocytopenia -chronic.  No bleeding.  Continue to monitor  Sepsis ruled out   Discharge Instructions  Discharge Instructions     Home infusion instructions   Complete by: As directed    Instructions: Flushing of vascular access device: 0.9% NaCl pre/post medication administration and prn patency; Heparin 100 u/ml, 26m for implanted ports and Heparin 10u/ml, 546mfor all other central venous catheters.      Allergies as of 02/23/2022       Reactions   Vancomycin Itching        Medication List     TAKE these medications    ceFAZolin  IVPB Commonly known as: ANCEF Inject 2 g into the vein Every Tuesday,Thursday,and Saturday with dialysis for 11 days. Indication:  MSSE bacteremia First Dose: Yes Last Day of Therapy:  03/08/22 Labs - Once weekly:  CBC/D and BMP, Labs - Every other week:  ESR and CRP Method of administration: Per protocol at HD center Method of administration may be changed at the discretion of home infusion pharmacist based upon assessment of the patient and/or caregiver's ability to self-administer the medication ordered. Start taking on: February 25, 2022   chlorproMAZINE  10 MG tablet Commonly known as: THORAZINE Take 1 tablet (10 mg total) by mouth 3 (three) times daily as needed for hiccoughs.   ferric citrate 1 GM 210 MG(Fe) tablet Commonly known as: AURYXIA Take 630 mg by mouth in the morning, at noon, and at bedtime.   furosemide 80 MG tablet Commonly known as: LASIX Take 80 mg by mouth daily.   gabapentin 100 MG capsule Commonly known as: Neurontin Take 1 capsule (100 mg total) by mouth 3  (three) times daily for 10 days.   Lokelma 10 g Pack packet Generic drug: sodium zirconium cyclosilicate Take 1 packet by mouth daily.   ondansetron 4 MG tablet Commonly known as: ZOFRAN Take 1 tablet (4 mg total) by mouth every 8 (eight) hours as needed for nausea.   pantoprazole 40 MG tablet Commonly known as: PROTONIX Take 1 tablet (40 mg total) by mouth 2 (two) times daily.   sucralfate 1 GM/10ML suspension Commonly known as: CARAFATE Take 10 mLs (1 g total) by mouth 4 (four) times daily -  with meals and at bedtime for 7 days.               Home Infusion Instuctions  (From admission, onward)           Start     Ordered   02/23/22 0000  Home infusion instructions       Question:  Instructions  Answer:  Flushing of vascular access device: 0.9% NaCl pre/post medication administration and prn patency; Heparin 100 u/ml, 48m for implanted ports and Heparin 10u/ml, 581mfor all other central venous catheters.   02/23/22 0941           Consultations: Nephrology ID IR  Procedures/Studies:  IR Fluoro Guide CV Line Right  Result Date: 02/23/2022 INDICATION: Chronic right femoral tunneled HD catheter, end-stage renal disease, staph bacteremia EXAM: FLUOROSCOPIC EXCHANGE OF THE RIGHT FEMORAL TUNNELED HD CATHETER MEDICATIONS: Ancef 2 g; The antibiotic was administered within an appropriate time interval prior to skin puncture. ANESTHESIA/SEDATION: None. FLUOROSCOPY: Radiation Exposure Index (as provided by the fluoroscopic device): 1.0 mGy Kerma COMPLICATIONS: None immediate. PROCEDURE: Informed written consent was obtained from the patient after a thorough discussion of the procedural risks, benefits and alternatives. All questions were addressed. Maximal Sterile Barrier Technique was utilized including caps, mask, sterile gowns, sterile gloves, sterile drape, hand hygiene and skin antiseptic. A timeout was performed prior to the initiation of the procedure. Under sterile  conditions and local anesthesia, the existing right femoral tunneled HD catheter was exchanged over a stiff Glidewire without difficulty. Catheter tip position at the IVC right atrial junction. Blood aspirated easily followed by saline and heparin flushes. Appropriate volume strength of heparin instilled in both lumens followed by external caps. Catheter secured with Prolene sutures and a sterile dressing. No immediate complication. Patient tolerated the procedure well. IMPRESSION: Successful fluoroscopic exchange of the right femoral tunneled HD catheter. Access ready for use. Electronically Signed   By: M.Jerilynn Mages Shick M.D.   On: 02/23/2022 10:32     Subjective: - no chest pain, shortness of breath, no abdominal pain, nausea or vomiting.   Discharge Exam: BP (!) 148/101 (BP Location: Right Arm)   Pulse 75   Temp 98.6 F (37 C) (Oral)   Resp 17   Ht 5' 10"$  (1.778 m)   Wt 78.4 kg   SpO2 96%   BMI 24.80 kg/m   General: Pt is alert, awake, not in acute distress Cardiovascular: RRR, S1/S2 +, no  rubs, no gallops Respiratory: CTA bilaterally, no wheezing, no rhonchi Abdominal: Soft, NT, ND, bowel sounds + Extremities: no edema, no cyanosis    The results of significant diagnostics from this hospitalization (including imaging, microbiology, ancillary and laboratory) are listed below for reference.     Microbiology: Recent Results (from the past 240 hour(s))  Blood culture (routine x 2)     Status: None (Preliminary result)   Collection Time: 02/21/22  8:27 PM   Specimen: BLOOD  Result Value Ref Range Status   Specimen Description BLOOD SITE NOT SPECIFIED  Final   Special Requests   Final    BOTTLES DRAWN AEROBIC AND ANAEROBIC Blood Culture adequate volume   Culture   Final    NO GROWTH 2 DAYS Performed at [facility], [address], [place], [place] [address]    Report Status PENDING  Incomplete  Blood culture (routine x 2)     Status: None (Preliminary result)   Collection  Time: 02/21/22  8:32 PM   Specimen: BLOOD  Result Value Ref Range Status   Specimen Description BLOOD BLOOD RIGHT ARM  Final   Special Requests   Final    BOTTLES DRAWN AEROBIC AND ANAEROBIC Blood Culture adequate volume   Culture   Final    NO GROWTH 2 DAYS Performed at [facility], [place] [address], [place], [place] [address]    Report Status PENDING  Incomplete     Labs: Basic Metabolic Panel: Recent Labs  Lab 02/21/22 1702 02/22/22 0430  NA 135 136  K 3.4* 3.8  CL 95* 95*  CO2 27 25  GLUCOSE 92 75  BUN 37* 40*  CREATININE 13.56* 13.96*  CALCIUM 8.4* 8.3*   Liver Function Tests: Recent Labs  Lab 02/21/22 1702  AST 9*  ALT 5  ALKPHOS 74  BILITOT 0.4  PROT 6.6  ALBUMIN 2.6*   CBC: Recent Labs  Lab 02/21/22 1702 02/22/22 0430  WBC 3.7* 3.7*  NEUTROABS 2.0  --   HGB 9.1* 8.2*  HCT 28.0* 26.0*  MCV 79.1* 80.2  PLT 130* 109*   CBG: No results for input(s): "GLUCAP" in the last 168 hours. Hgb A1c No results for input(s): "HGBA1C" in the last 72 hours. Lipid Profile No results for input(s): "CHOL", "HDL", "LDLCALC", "TRIG", "CHOLHDL", "LDLDIRECT" in the last 72 hours. Thyroid function studies No results for input(s): "TSH", "T4TOTAL", "T3FREE", "THYROIDAB" in the last 72 hours.  Invalid input(s): "FREET3" Urinalysis No results found for: "COLORURINE", "APPEARANCEUR", "LABSPEC", "PHURINE", "GLUCOSEU", "HGBUR", "BILIRUBINUR", "KETONESUR", "PROTEINUR", "UROBILINOGEN", "NITRITE", "LEUKOCYTESUR"  FURTHER DISCHARGE INSTRUCTIONS:   Get Medicines reviewed and adjusted: Please take all your medications with you for your next visit with your Primary MD   Laboratory/radiological data: Please request your Primary MD to go over all hospital tests and procedure/radiological results at the follow up, please ask your Primary MD to get all Hospital records sent to his/her office.   In some cases, they will be blood work, cultures and biopsy results pending at  the time of your discharge. Please request that your primary care M.D. goes through all the records of your hospital data and follows up on these results.   Also Note the following: If you experience worsening of your admission symptoms, develop shortness of breath, life threatening emergency, suicidal or homicidal thoughts you must seek medical attention immediately by calling 911 or calling your MD immediately  if symptoms less severe.   You must read complete instructions/literature along with all  the possible adverse reactions/side effects for all the Medicines you take and that have been prescribed to you. Take any new Medicines after you have completely understood and accpet all the possible adverse reactions/side effects.    Do not drive when taking Pain medications or sleeping medications (Benzodaizepines)   Do not take more than prescribed Pain, Sleep and Anxiety Medications. It is not advisable to combine anxiety,sleep and pain medications without talking with your primary care practitioner   Special Instructions: If you have smoked or chewed Tobacco  in the last 2 yrs please stop smoking, stop any regular Alcohol  and or any Recreational drug use.   Wear Seat belts while driving.   Please note: You were cared for by a hospitalist during your hospital stay. Once you are discharged, your primary care physician will handle any further medical issues. Please note that NO REFILLS for any discharge medications will be authorized once you are discharged, as it is imperative that you return to your primary care physician (or establish a relationship with a primary care physician if you do not have one) for your post hospital discharge needs so that they can reassess your need for medications and monitor your lab values.  Time coordinating discharge: 40 minutes  SIGNED:  Marzetta Board, MD, PhD 02/23/2022, 10:58 AM

## 2022-02-23 NOTE — Progress Notes (Signed)
[facility] Progress Note   Subjective:    Seen and examined patient at bedside. S/p "over the wire" Rt fem TDC exchange by IR today. Patient continues to do well: remains afebrile, no evidence leukocytosis, and vital signs/labs stable. Discussed with Hospitalist and Pharmacy. Planning for discharge today.  Objective Vitals:   02/23/22 0022 02/23/22 0055 02/23/22 0448 02/23/22 0831  BP: 114/84  118/82 (!) 148/101  Pulse: 77 66 72 75  Resp: 17 13 17 17  $ Temp: 97.8 F (36.6 C)  98.4 F (36.9 C) 98.6 F (37 C)  TempSrc: Oral  Oral Oral  SpO2: 100% 100% 100% 96%  Weight:      Height:       Physical Exam General: Awake, alert, on RA, NAD Heart: S1 and S2; No MGRs Lungs: CTA; No wheezing, rales, or rhonchi Abdomen: Soft and non-tender Extremities: No LE edema Dialysis Access: Rt fem TDC (exchanged today in IR)   Filed Weights   02/22/22 1547 02/22/22 1912 02/22/22 1944  Weight: 74.8 kg 73.5 kg 78.4 kg    Intake/Output Summary (Last 24 hours) at 02/23/2022 1035 Last data filed at 02/22/2022 1902 Gross per 24 hour  Intake --  Output 1500 ml  Net -1500 ml    Additional Objective Labs: Basic Metabolic Panel: Recent Labs  Lab 02/21/22 1702 02/22/22 0430  NA 135 136  K 3.4* 3.8  CL 95* 95*  CO2 27 25  GLUCOSE 92 75  BUN 37* 40*  CREATININE 13.56* 13.96*  CALCIUM 8.4* 8.3*   Liver Function Tests: Recent Labs  Lab 02/21/22 1702  AST 9*  ALT 5  ALKPHOS 74  BILITOT 0.4  PROT 6.6  ALBUMIN 2.6*   No results for input(s): "LIPASE", "AMYLASE" in the last 168 hours. CBC: Recent Labs  Lab 02/21/22 1702 02/22/22 0430  WBC 3.7* 3.7*  NEUTROABS 2.0  --   HGB 9.1* 8.2*  HCT 28.0* 26.0*  MCV 79.1* 80.2  PLT 130* 109*   Blood Culture    Component Value Date/Time   SDES BLOOD BLOOD RIGHT ARM 02/21/2022 2032   SPECREQUEST  02/21/2022 2032    BOTTLES DRAWN AEROBIC AND ANAEROBIC Blood Culture adequate volume   CULT  02/21/2022 2032    NO GROWTH  2 DAYS Performed at [facility], [place] [address], [place], [place] [address]    REPTSTATUS PENDING 02/21/2022 2032    Cardiac Enzymes: No results for input(s): "CKTOTAL", "CKMB", "CKMBINDEX", "TROPONINI" in the last 168 hours. CBG: No results for input(s): "GLUCAP" in the last 168 hours. Iron Studies: No results for input(s): "IRON", "TIBC", "TRANSFERRIN", "FERRITIN" in the last 72 hours. No results found for: "INR", "PROTIME" Studies/Results: IR Fluoro Guide CV Line Right  Result Date: 02/23/2022 INDICATION: Chronic right femoral tunneled HD catheter, end-stage renal disease, staph bacteremia EXAM: FLUOROSCOPIC EXCHANGE OF THE RIGHT FEMORAL TUNNELED HD CATHETER MEDICATIONS: Ancef 2 g; The antibiotic was administered within an appropriate time interval prior to skin puncture. ANESTHESIA/SEDATION: None. FLUOROSCOPY: Radiation Exposure Index (as provided by the fluoroscopic device): 1.0 mGy Kerma COMPLICATIONS: None immediate. PROCEDURE: Informed written consent was obtained from the patient after a thorough discussion of the procedural risks, benefits and alternatives. All questions were addressed. Maximal Sterile Barrier Technique was utilized including caps, mask, sterile gowns, sterile gloves, sterile drape, hand hygiene and skin antiseptic. A timeout was performed prior to the initiation of the procedure. Under sterile conditions and local anesthesia, the existing right femoral tunneled HD catheter was exchanged over  a stiff Glidewire without difficulty. Catheter tip position at the IVC right atrial junction. Blood aspirated easily followed by saline and heparin flushes. Appropriate volume strength of heparin instilled in both lumens followed by external caps. Catheter secured with Prolene sutures and a sterile dressing. No immediate complication. Patient tolerated the procedure well. IMPRESSION: Successful fluoroscopic exchange of the right femoral tunneled HD catheter. Access ready for use.  Electronically Signed   By: Jerilynn Mages.  Shick M.D.   On: 02/23/2022 10:32    Medications:   ceFAZolin (ANCEF) IV 2 g (02/23/22 1029)   [START ON 02/25/2022]  ceFAZolin (ANCEF) IV      Chlorhexidine Gluconate Cloth  6 each Topical Q0600   [START ON 02/25/2022] cinacalcet  30 mg Oral Q T,Th,Sa-HD   ferric citrate  630 mg Oral TID WC    Dialysis Orders: TTS - [facility] 4hrs 89mn, BFR 400, DFR 800,  EDW 73.5kg, 2K/ 2.5Ca No heparin bolus ordered Mircera 100 mcg q2wks - last 02/14/22 Hectorol 440m IV qHD - last 02/18/22 Sensipar 3060mith HD - last 02/18/22 Home meds: Auryxia 210m12m tabs TID with meals and 3 tabs with snacks; Lokelma 10GM daily; Sildenafil 100mg23mly PRN  Assessment/Plan: Coag negative staph bacteremia - Discussed case with Dr. PatelPosey Prontood cultures from outpatient from 2/6 may have been a contaminate given patient is no longer showing an infectious picture here. He remains afebrile with no evidence of leukocytosis. Blood cultures from 2/13 NGTD. Additionally, given patient's history of difficult IV/dialysis access, it was decided for IR to perform an "over the wire" Rt fem TDC exchange to not jeopardize future access placement. S/p Rt fem TDC exchange "over the wire" done today in IR. Plan for discharge today and discussed with pharmacy. Plan for patient to receive Cefazolin 2GM IV with HD until 03/08/22. Patient scheduled to receive an extra ABX dose here so he can resume the ABX course on Saturday in outpatient. Discussed plan with [facility] Charge RN to make HD center aware. ESRD - on HD TTS. Patient to resume HD on Saturday in outpatient.  Hypertension/volume  - Bps controlled and euvolemic on exam. Anemia of CKD - Hgb 8.2, no Fe since he is on ABX for current bacteremia. ESA not due yet. Secondary Hyperparathyroidism - Will continue binders, VDRA, amd Sensipar. PO4 trending in 8s in outpatient. Checking labs in AM Nutrition - Renal with fluid restriction Dispo -  Okay for discharge from renal standpoint. Discussed this with both Hospitalist and Pharmacy. Please see above for ABX plan.  [facility] 02/23/2022,10:35 AM  LOS: 2 days

## 2022-02-23 NOTE — TOC Transition Note (Signed)
Transition of Care [facility]) - CM/SW Discharge Note   Patient Details  Name: Lohgan Alghamdi Norberto MRN: [id number] Date of Birth: [birth year]  Transition of [facility] CM/SW Contact:  Levonne Lapping, RN Phone Number: 02/23/2022, 11:58 AM   Clinical Narrative:     CM met with patient bedside. Patient states he will dc to home where he lives with his "baby Eliezer Bottom" She will be picking him up after work around 8:oo pm. Thus is his only source of transportation. Patient will receive his IV ABX at HD appointments. No additional TOC needs             Patient Goals and CMS Choice      Discharge Placement                         Discharge Plan and Services Additional resources added to the After Visit Summary for                                       Social Determinants of Health (SDOH) Interventions SDOH Screenings   Tobacco Use: High Risk (02/23/2022)     Readmission Risk Interventions     No data to display

## 2022-02-25 ENCOUNTER — Telehealth (HOSPITAL_COMMUNITY): Payer: Self-pay | Admitting: Nephrology

## 2022-02-25 NOTE — Telephone Encounter (Signed)
Transition of care contact from inpatient facility  Date of Discharge: 02/23/22 Date of Contact: 02/25/22 - attempted Method of contact: Phone  Attempted to contact patient to discuss transition of care from inpatient admission. Patient did not answer the phone. There was no ability to leave a voicemail. Will try to contact patient during dialysis within the next week.  Veneta Penton, PA-C [employer] Pager [phone]

## 2022-02-26 LAB — CULTURE, BLOOD (ROUTINE X 2)
Culture: NO GROWTH
Culture: NO GROWTH
Special Requests: ADEQUATE
Special Requests: ADEQUATE

## 2022-10-10 DIAGNOSIS — N186 End stage renal disease: Secondary | ICD-10-CM | POA: Diagnosis not present

## 2022-10-10 DIAGNOSIS — N2581 Secondary hyperparathyroidism of renal origin: Secondary | ICD-10-CM | POA: Diagnosis not present

## 2022-10-10 DIAGNOSIS — Z992 Dependence on renal dialysis: Secondary | ICD-10-CM | POA: Diagnosis not present

## 2022-10-12 DIAGNOSIS — N2581 Secondary hyperparathyroidism of renal origin: Secondary | ICD-10-CM | POA: Diagnosis not present

## 2022-10-12 DIAGNOSIS — N186 End stage renal disease: Secondary | ICD-10-CM | POA: Diagnosis not present

## 2022-10-12 DIAGNOSIS — Z992 Dependence on renal dialysis: Secondary | ICD-10-CM | POA: Diagnosis not present

## 2022-10-14 DIAGNOSIS — Z992 Dependence on renal dialysis: Secondary | ICD-10-CM | POA: Diagnosis not present

## 2022-10-14 DIAGNOSIS — N186 End stage renal disease: Secondary | ICD-10-CM | POA: Diagnosis not present

## 2022-10-14 DIAGNOSIS — N2581 Secondary hyperparathyroidism of renal origin: Secondary | ICD-10-CM | POA: Diagnosis not present

## 2022-10-17 DIAGNOSIS — N186 End stage renal disease: Secondary | ICD-10-CM | POA: Diagnosis not present

## 2022-10-17 DIAGNOSIS — N2581 Secondary hyperparathyroidism of renal origin: Secondary | ICD-10-CM | POA: Diagnosis not present

## 2022-10-17 DIAGNOSIS — Z992 Dependence on renal dialysis: Secondary | ICD-10-CM | POA: Diagnosis not present

## 2022-10-19 DIAGNOSIS — N2581 Secondary hyperparathyroidism of renal origin: Secondary | ICD-10-CM | POA: Diagnosis not present

## 2022-10-19 DIAGNOSIS — N186 End stage renal disease: Secondary | ICD-10-CM | POA: Diagnosis not present

## 2022-10-19 DIAGNOSIS — Z992 Dependence on renal dialysis: Secondary | ICD-10-CM | POA: Diagnosis not present

## 2022-10-24 DIAGNOSIS — Z992 Dependence on renal dialysis: Secondary | ICD-10-CM | POA: Diagnosis not present

## 2022-10-24 DIAGNOSIS — N2581 Secondary hyperparathyroidism of renal origin: Secondary | ICD-10-CM | POA: Diagnosis not present

## 2022-10-24 DIAGNOSIS — N186 End stage renal disease: Secondary | ICD-10-CM | POA: Diagnosis not present

## 2022-10-26 DIAGNOSIS — Z992 Dependence on renal dialysis: Secondary | ICD-10-CM | POA: Diagnosis not present

## 2022-10-26 DIAGNOSIS — N186 End stage renal disease: Secondary | ICD-10-CM | POA: Diagnosis not present

## 2022-10-26 DIAGNOSIS — N2581 Secondary hyperparathyroidism of renal origin: Secondary | ICD-10-CM | POA: Diagnosis not present

## 2022-10-28 DIAGNOSIS — Z992 Dependence on renal dialysis: Secondary | ICD-10-CM | POA: Diagnosis not present

## 2022-10-28 DIAGNOSIS — N2581 Secondary hyperparathyroidism of renal origin: Secondary | ICD-10-CM | POA: Diagnosis not present

## 2022-10-28 DIAGNOSIS — N186 End stage renal disease: Secondary | ICD-10-CM | POA: Diagnosis not present

## 2022-10-31 DIAGNOSIS — N2581 Secondary hyperparathyroidism of renal origin: Secondary | ICD-10-CM | POA: Diagnosis not present

## 2022-10-31 DIAGNOSIS — Z992 Dependence on renal dialysis: Secondary | ICD-10-CM | POA: Diagnosis not present

## 2022-10-31 DIAGNOSIS — N186 End stage renal disease: Secondary | ICD-10-CM | POA: Diagnosis not present

## 2022-11-02 DIAGNOSIS — N2581 Secondary hyperparathyroidism of renal origin: Secondary | ICD-10-CM | POA: Diagnosis not present

## 2022-11-02 DIAGNOSIS — Z992 Dependence on renal dialysis: Secondary | ICD-10-CM | POA: Diagnosis not present

## 2022-11-02 DIAGNOSIS — N186 End stage renal disease: Secondary | ICD-10-CM | POA: Diagnosis not present

## 2022-11-04 DIAGNOSIS — N2581 Secondary hyperparathyroidism of renal origin: Secondary | ICD-10-CM | POA: Diagnosis not present

## 2022-11-04 DIAGNOSIS — N186 End stage renal disease: Secondary | ICD-10-CM | POA: Diagnosis not present

## 2022-11-04 DIAGNOSIS — Z992 Dependence on renal dialysis: Secondary | ICD-10-CM | POA: Diagnosis not present

## 2022-11-07 DIAGNOSIS — N2581 Secondary hyperparathyroidism of renal origin: Secondary | ICD-10-CM | POA: Diagnosis not present

## 2022-11-07 DIAGNOSIS — Z992 Dependence on renal dialysis: Secondary | ICD-10-CM | POA: Diagnosis not present

## 2022-11-07 DIAGNOSIS — N186 End stage renal disease: Secondary | ICD-10-CM | POA: Diagnosis not present

## 2022-11-09 DIAGNOSIS — N186 End stage renal disease: Secondary | ICD-10-CM | POA: Diagnosis not present

## 2022-11-09 DIAGNOSIS — N2581 Secondary hyperparathyroidism of renal origin: Secondary | ICD-10-CM | POA: Diagnosis not present

## 2022-11-09 DIAGNOSIS — M3210 Systemic lupus erythematosus, organ or system involvement unspecified: Secondary | ICD-10-CM | POA: Diagnosis not present

## 2022-11-09 DIAGNOSIS — Z992 Dependence on renal dialysis: Secondary | ICD-10-CM | POA: Diagnosis not present

## 2022-11-11 DIAGNOSIS — N186 End stage renal disease: Secondary | ICD-10-CM | POA: Diagnosis not present

## 2022-11-11 DIAGNOSIS — Z992 Dependence on renal dialysis: Secondary | ICD-10-CM | POA: Diagnosis not present

## 2022-11-11 DIAGNOSIS — N2581 Secondary hyperparathyroidism of renal origin: Secondary | ICD-10-CM | POA: Diagnosis not present

## 2022-11-14 DIAGNOSIS — Z992 Dependence on renal dialysis: Secondary | ICD-10-CM | POA: Diagnosis not present

## 2022-11-14 DIAGNOSIS — R6889 Other general symptoms and signs: Secondary | ICD-10-CM | POA: Diagnosis not present

## 2022-11-14 DIAGNOSIS — N2581 Secondary hyperparathyroidism of renal origin: Secondary | ICD-10-CM | POA: Diagnosis not present

## 2022-11-14 DIAGNOSIS — N186 End stage renal disease: Secondary | ICD-10-CM | POA: Diagnosis not present

## 2022-11-16 DIAGNOSIS — Z992 Dependence on renal dialysis: Secondary | ICD-10-CM | POA: Diagnosis not present

## 2022-11-16 DIAGNOSIS — N186 End stage renal disease: Secondary | ICD-10-CM | POA: Diagnosis not present

## 2022-11-16 DIAGNOSIS — N2581 Secondary hyperparathyroidism of renal origin: Secondary | ICD-10-CM | POA: Diagnosis not present

## 2022-11-16 DIAGNOSIS — R6889 Other general symptoms and signs: Secondary | ICD-10-CM | POA: Diagnosis not present

## 2022-11-18 DIAGNOSIS — N186 End stage renal disease: Secondary | ICD-10-CM | POA: Diagnosis not present

## 2022-11-18 DIAGNOSIS — Z992 Dependence on renal dialysis: Secondary | ICD-10-CM | POA: Diagnosis not present

## 2022-11-18 DIAGNOSIS — N2581 Secondary hyperparathyroidism of renal origin: Secondary | ICD-10-CM | POA: Diagnosis not present

## 2022-11-18 DIAGNOSIS — R6889 Other general symptoms and signs: Secondary | ICD-10-CM | POA: Diagnosis not present

## 2022-11-21 DIAGNOSIS — Z992 Dependence on renal dialysis: Secondary | ICD-10-CM | POA: Diagnosis not present

## 2022-11-21 DIAGNOSIS — N2581 Secondary hyperparathyroidism of renal origin: Secondary | ICD-10-CM | POA: Diagnosis not present

## 2022-11-21 DIAGNOSIS — N186 End stage renal disease: Secondary | ICD-10-CM | POA: Diagnosis not present

## 2022-11-21 DIAGNOSIS — R6889 Other general symptoms and signs: Secondary | ICD-10-CM | POA: Diagnosis not present

## 2022-11-23 DIAGNOSIS — Z992 Dependence on renal dialysis: Secondary | ICD-10-CM | POA: Diagnosis not present

## 2022-11-23 DIAGNOSIS — N2581 Secondary hyperparathyroidism of renal origin: Secondary | ICD-10-CM | POA: Diagnosis not present

## 2022-11-23 DIAGNOSIS — R6889 Other general symptoms and signs: Secondary | ICD-10-CM | POA: Diagnosis not present

## 2022-11-23 DIAGNOSIS — N186 End stage renal disease: Secondary | ICD-10-CM | POA: Diagnosis not present

## 2022-11-25 DIAGNOSIS — R6889 Other general symptoms and signs: Secondary | ICD-10-CM | POA: Diagnosis not present

## 2022-11-25 DIAGNOSIS — N2581 Secondary hyperparathyroidism of renal origin: Secondary | ICD-10-CM | POA: Diagnosis not present

## 2022-11-25 DIAGNOSIS — N186 End stage renal disease: Secondary | ICD-10-CM | POA: Diagnosis not present

## 2022-11-25 DIAGNOSIS — Z992 Dependence on renal dialysis: Secondary | ICD-10-CM | POA: Diagnosis not present

## 2022-11-28 DIAGNOSIS — R6889 Other general symptoms and signs: Secondary | ICD-10-CM | POA: Diagnosis not present

## 2022-11-28 DIAGNOSIS — N2581 Secondary hyperparathyroidism of renal origin: Secondary | ICD-10-CM | POA: Diagnosis not present

## 2022-11-28 DIAGNOSIS — Z992 Dependence on renal dialysis: Secondary | ICD-10-CM | POA: Diagnosis not present

## 2022-11-28 DIAGNOSIS — N186 End stage renal disease: Secondary | ICD-10-CM | POA: Diagnosis not present

## 2022-11-30 DIAGNOSIS — N186 End stage renal disease: Secondary | ICD-10-CM | POA: Diagnosis not present

## 2022-11-30 DIAGNOSIS — R6889 Other general symptoms and signs: Secondary | ICD-10-CM | POA: Diagnosis not present

## 2022-11-30 DIAGNOSIS — Z992 Dependence on renal dialysis: Secondary | ICD-10-CM | POA: Diagnosis not present

## 2022-11-30 DIAGNOSIS — N2581 Secondary hyperparathyroidism of renal origin: Secondary | ICD-10-CM | POA: Diagnosis not present

## 2022-12-05 DIAGNOSIS — N2581 Secondary hyperparathyroidism of renal origin: Secondary | ICD-10-CM | POA: Diagnosis not present

## 2022-12-05 DIAGNOSIS — N186 End stage renal disease: Secondary | ICD-10-CM | POA: Diagnosis not present

## 2022-12-05 DIAGNOSIS — Z992 Dependence on renal dialysis: Secondary | ICD-10-CM | POA: Diagnosis not present

## 2022-12-05 DIAGNOSIS — R6889 Other general symptoms and signs: Secondary | ICD-10-CM | POA: Diagnosis not present

## 2022-12-09 DIAGNOSIS — M3210 Systemic lupus erythematosus, organ or system involvement unspecified: Secondary | ICD-10-CM | POA: Diagnosis not present

## 2022-12-09 DIAGNOSIS — R6889 Other general symptoms and signs: Secondary | ICD-10-CM | POA: Diagnosis not present

## 2022-12-09 DIAGNOSIS — N186 End stage renal disease: Secondary | ICD-10-CM | POA: Diagnosis not present

## 2022-12-09 DIAGNOSIS — Z992 Dependence on renal dialysis: Secondary | ICD-10-CM | POA: Diagnosis not present

## 2022-12-09 DIAGNOSIS — N2581 Secondary hyperparathyroidism of renal origin: Secondary | ICD-10-CM | POA: Diagnosis not present

## 2022-12-12 DIAGNOSIS — Z992 Dependence on renal dialysis: Secondary | ICD-10-CM | POA: Diagnosis not present

## 2022-12-12 DIAGNOSIS — R6889 Other general symptoms and signs: Secondary | ICD-10-CM | POA: Diagnosis not present

## 2022-12-12 DIAGNOSIS — N186 End stage renal disease: Secondary | ICD-10-CM | POA: Diagnosis not present

## 2022-12-12 DIAGNOSIS — N2581 Secondary hyperparathyroidism of renal origin: Secondary | ICD-10-CM | POA: Diagnosis not present

## 2022-12-14 DIAGNOSIS — R6889 Other general symptoms and signs: Secondary | ICD-10-CM | POA: Diagnosis not present

## 2022-12-14 DIAGNOSIS — N186 End stage renal disease: Secondary | ICD-10-CM | POA: Diagnosis not present

## 2022-12-14 DIAGNOSIS — Z992 Dependence on renal dialysis: Secondary | ICD-10-CM | POA: Diagnosis not present

## 2022-12-14 DIAGNOSIS — N2581 Secondary hyperparathyroidism of renal origin: Secondary | ICD-10-CM | POA: Diagnosis not present

## 2022-12-16 DIAGNOSIS — N186 End stage renal disease: Secondary | ICD-10-CM | POA: Diagnosis not present

## 2022-12-16 DIAGNOSIS — Z992 Dependence on renal dialysis: Secondary | ICD-10-CM | POA: Diagnosis not present

## 2022-12-16 DIAGNOSIS — N2581 Secondary hyperparathyroidism of renal origin: Secondary | ICD-10-CM | POA: Diagnosis not present

## 2022-12-16 DIAGNOSIS — R6889 Other general symptoms and signs: Secondary | ICD-10-CM | POA: Diagnosis not present

## 2022-12-21 DIAGNOSIS — N2581 Secondary hyperparathyroidism of renal origin: Secondary | ICD-10-CM | POA: Diagnosis not present

## 2022-12-21 DIAGNOSIS — N186 End stage renal disease: Secondary | ICD-10-CM | POA: Diagnosis not present

## 2022-12-21 DIAGNOSIS — R6889 Other general symptoms and signs: Secondary | ICD-10-CM | POA: Diagnosis not present

## 2022-12-21 DIAGNOSIS — Z992 Dependence on renal dialysis: Secondary | ICD-10-CM | POA: Diagnosis not present

## 2022-12-26 DIAGNOSIS — N186 End stage renal disease: Secondary | ICD-10-CM | POA: Diagnosis not present

## 2022-12-26 DIAGNOSIS — N2581 Secondary hyperparathyroidism of renal origin: Secondary | ICD-10-CM | POA: Diagnosis not present

## 2022-12-26 DIAGNOSIS — R6889 Other general symptoms and signs: Secondary | ICD-10-CM | POA: Diagnosis not present

## 2022-12-26 DIAGNOSIS — Z992 Dependence on renal dialysis: Secondary | ICD-10-CM | POA: Diagnosis not present

## 2022-12-28 DIAGNOSIS — N186 End stage renal disease: Secondary | ICD-10-CM | POA: Diagnosis not present

## 2022-12-28 DIAGNOSIS — R6889 Other general symptoms and signs: Secondary | ICD-10-CM | POA: Diagnosis not present

## 2022-12-28 DIAGNOSIS — Z992 Dependence on renal dialysis: Secondary | ICD-10-CM | POA: Diagnosis not present

## 2022-12-28 DIAGNOSIS — N2581 Secondary hyperparathyroidism of renal origin: Secondary | ICD-10-CM | POA: Diagnosis not present

## 2022-12-30 DIAGNOSIS — N2581 Secondary hyperparathyroidism of renal origin: Secondary | ICD-10-CM | POA: Diagnosis not present

## 2022-12-30 DIAGNOSIS — N186 End stage renal disease: Secondary | ICD-10-CM | POA: Diagnosis not present

## 2022-12-30 DIAGNOSIS — R6889 Other general symptoms and signs: Secondary | ICD-10-CM | POA: Diagnosis not present

## 2022-12-30 DIAGNOSIS — Z992 Dependence on renal dialysis: Secondary | ICD-10-CM | POA: Diagnosis not present

## 2023-01-01 DIAGNOSIS — Z992 Dependence on renal dialysis: Secondary | ICD-10-CM | POA: Diagnosis not present

## 2023-01-01 DIAGNOSIS — N186 End stage renal disease: Secondary | ICD-10-CM | POA: Diagnosis not present

## 2023-01-01 DIAGNOSIS — R6889 Other general symptoms and signs: Secondary | ICD-10-CM | POA: Diagnosis not present

## 2023-01-01 DIAGNOSIS — N2581 Secondary hyperparathyroidism of renal origin: Secondary | ICD-10-CM | POA: Diagnosis not present

## 2023-01-04 DIAGNOSIS — N2581 Secondary hyperparathyroidism of renal origin: Secondary | ICD-10-CM | POA: Diagnosis not present

## 2023-01-04 DIAGNOSIS — N186 End stage renal disease: Secondary | ICD-10-CM | POA: Diagnosis not present

## 2023-01-04 DIAGNOSIS — Z992 Dependence on renal dialysis: Secondary | ICD-10-CM | POA: Diagnosis not present

## 2023-01-04 DIAGNOSIS — R6889 Other general symptoms and signs: Secondary | ICD-10-CM | POA: Diagnosis not present

## 2023-01-06 DIAGNOSIS — N2581 Secondary hyperparathyroidism of renal origin: Secondary | ICD-10-CM | POA: Diagnosis not present

## 2023-01-06 DIAGNOSIS — N186 End stage renal disease: Secondary | ICD-10-CM | POA: Diagnosis not present

## 2023-01-06 DIAGNOSIS — Z992 Dependence on renal dialysis: Secondary | ICD-10-CM | POA: Diagnosis not present

## 2023-01-06 DIAGNOSIS — R6889 Other general symptoms and signs: Secondary | ICD-10-CM | POA: Diagnosis not present

## 2023-01-08 DIAGNOSIS — N2581 Secondary hyperparathyroidism of renal origin: Secondary | ICD-10-CM | POA: Diagnosis not present

## 2023-01-08 DIAGNOSIS — N186 End stage renal disease: Secondary | ICD-10-CM | POA: Diagnosis not present

## 2023-01-08 DIAGNOSIS — Z992 Dependence on renal dialysis: Secondary | ICD-10-CM | POA: Diagnosis not present

## 2023-01-08 DIAGNOSIS — R6889 Other general symptoms and signs: Secondary | ICD-10-CM | POA: Diagnosis not present

## 2023-01-09 DIAGNOSIS — M3210 Systemic lupus erythematosus, organ or system involvement unspecified: Secondary | ICD-10-CM | POA: Diagnosis not present

## 2023-01-09 DIAGNOSIS — N186 End stage renal disease: Secondary | ICD-10-CM | POA: Diagnosis not present

## 2023-01-09 DIAGNOSIS — Z992 Dependence on renal dialysis: Secondary | ICD-10-CM | POA: Diagnosis not present

## 2023-01-16 DIAGNOSIS — R079 Chest pain, unspecified: Secondary | ICD-10-CM | POA: Diagnosis not present

## 2023-01-17 DIAGNOSIS — E871 Hypo-osmolality and hyponatremia: Secondary | ICD-10-CM | POA: Diagnosis not present

## 2023-01-17 DIAGNOSIS — I160 Hypertensive urgency: Secondary | ICD-10-CM | POA: Diagnosis not present

## 2023-01-17 DIAGNOSIS — D631 Anemia in chronic kidney disease: Secondary | ICD-10-CM | POA: Diagnosis not present

## 2023-01-17 DIAGNOSIS — N179 Acute kidney failure, unspecified: Secondary | ICD-10-CM | POA: Diagnosis not present

## 2023-01-17 DIAGNOSIS — I120 Hypertensive chronic kidney disease with stage 5 chronic kidney disease or end stage renal disease: Secondary | ICD-10-CM | POA: Diagnosis not present

## 2023-01-17 DIAGNOSIS — E8779 Other fluid overload: Secondary | ICD-10-CM | POA: Diagnosis not present

## 2023-01-17 DIAGNOSIS — E878 Other disorders of electrolyte and fluid balance, not elsewhere classified: Secondary | ICD-10-CM | POA: Diagnosis not present

## 2023-01-17 DIAGNOSIS — E8770 Fluid overload, unspecified: Secondary | ICD-10-CM | POA: Diagnosis not present

## 2023-01-17 DIAGNOSIS — E8721 Acute metabolic acidosis: Secondary | ICD-10-CM | POA: Diagnosis not present

## 2023-01-17 DIAGNOSIS — K5090 Crohn's disease, unspecified, without complications: Secondary | ICD-10-CM | POA: Diagnosis not present

## 2023-01-17 DIAGNOSIS — N186 End stage renal disease: Secondary | ICD-10-CM | POA: Diagnosis not present

## 2023-01-24 DIAGNOSIS — N2581 Secondary hyperparathyroidism of renal origin: Secondary | ICD-10-CM | POA: Diagnosis not present

## 2023-01-24 DIAGNOSIS — N186 End stage renal disease: Secondary | ICD-10-CM | POA: Diagnosis not present

## 2023-01-24 DIAGNOSIS — Z992 Dependence on renal dialysis: Secondary | ICD-10-CM | POA: Diagnosis not present

## 2023-01-26 DIAGNOSIS — N2581 Secondary hyperparathyroidism of renal origin: Secondary | ICD-10-CM | POA: Diagnosis not present

## 2023-01-26 DIAGNOSIS — N186 End stage renal disease: Secondary | ICD-10-CM | POA: Diagnosis not present

## 2023-01-26 DIAGNOSIS — Z992 Dependence on renal dialysis: Secondary | ICD-10-CM | POA: Diagnosis not present

## 2023-01-29 DIAGNOSIS — N186 End stage renal disease: Secondary | ICD-10-CM | POA: Diagnosis not present

## 2023-01-29 DIAGNOSIS — N2581 Secondary hyperparathyroidism of renal origin: Secondary | ICD-10-CM | POA: Diagnosis not present

## 2023-01-29 DIAGNOSIS — Z992 Dependence on renal dialysis: Secondary | ICD-10-CM | POA: Diagnosis not present

## 2023-02-02 DIAGNOSIS — N2581 Secondary hyperparathyroidism of renal origin: Secondary | ICD-10-CM | POA: Diagnosis not present

## 2023-02-02 DIAGNOSIS — Z992 Dependence on renal dialysis: Secondary | ICD-10-CM | POA: Diagnosis not present

## 2023-02-02 DIAGNOSIS — N186 End stage renal disease: Secondary | ICD-10-CM | POA: Diagnosis not present

## 2023-02-05 DIAGNOSIS — N2581 Secondary hyperparathyroidism of renal origin: Secondary | ICD-10-CM | POA: Diagnosis not present

## 2023-02-05 DIAGNOSIS — N186 End stage renal disease: Secondary | ICD-10-CM | POA: Diagnosis not present

## 2023-02-05 DIAGNOSIS — Z992 Dependence on renal dialysis: Secondary | ICD-10-CM | POA: Diagnosis not present

## 2023-02-07 DIAGNOSIS — N2581 Secondary hyperparathyroidism of renal origin: Secondary | ICD-10-CM | POA: Diagnosis not present

## 2023-02-07 DIAGNOSIS — Z992 Dependence on renal dialysis: Secondary | ICD-10-CM | POA: Diagnosis not present

## 2023-02-07 DIAGNOSIS — N186 End stage renal disease: Secondary | ICD-10-CM | POA: Diagnosis not present

## 2023-02-09 DIAGNOSIS — N186 End stage renal disease: Secondary | ICD-10-CM | POA: Diagnosis not present

## 2023-02-09 DIAGNOSIS — N2581 Secondary hyperparathyroidism of renal origin: Secondary | ICD-10-CM | POA: Diagnosis not present

## 2023-02-09 DIAGNOSIS — Z992 Dependence on renal dialysis: Secondary | ICD-10-CM | POA: Diagnosis not present

## 2023-03-02 ENCOUNTER — Other Ambulatory Visit: Payer: Self-pay

## 2023-03-02 DIAGNOSIS — N186 End stage renal disease: Secondary | ICD-10-CM

## 2023-03-05 ENCOUNTER — Telehealth: Payer: Self-pay | Admitting: *Deleted

## 2023-03-05 NOTE — Progress Notes (Signed)
 Complex Care Management Note  Care Guide Note 03/05/2023 Name: Nathan Castaneda MRN: [id number] DOB: 10-05-1975  [name] is a 48 y.o. year old male who sees Tyler Pita, MD for primary care. I reached out to [employer] by phone today to offer complex care management services.  [name] was given information about Complex Care Management services today including:   The Complex Care Management services include support from the care team which includes your Nurse Care Manager, Clinical Social Worker, or Pharmacist.  The Complex Care Management team is here to help remove barriers to the health concerns and goals most important to you. Complex Care Management services are voluntary, and the patient may decline or stop services at any time by request to their care team member.   Complex Care Management Consent Status: Patient did not agree to participate in complex care management services at this time.  Follow up plan:  upon introduction of [facility] and myself pt started using profanities  then ended the call.   Encounter Outcome:  Patient Refused  Burman Nieves, CMA, Care Guide [facility]  [facility], [facility] Guide Direct Dial: [phone]  Fax: [phone] Website: [email or web address]

## 2023-05-15 ENCOUNTER — Ambulatory Visit: Admitting: Vascular Surgery

## 2023-06-20 ENCOUNTER — Ambulatory Visit: Admitting: Vascular Surgery

## 2023-06-25 NOTE — Progress Notes (Deleted)
 [facility] [place]  ASSESSMENT / PLAN: 48 y.o. male with *** - ***  CHIEF COMPLAINT: ***  HISTORY OF PRESENT ILLNESS: Nathan Castaneda is a 48 y.o. 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 {FINDINGS; POSITIVE NEGATIVE:409 200 0854} history of coronary artery disease. *** history of PCI. *** history of CABG.  {FINDINGS; POSITIVE NEGATIVE:409 200 0854} history of diabetes mellitus. Last A1c ***. {FINDINGS; POSITIVE NEGATIVE:409 200 0854} history of smoking. *** actively smoking. {FINDINGS; POSITIVE NEGATIVE:409 200 0854} history of hypertension. *** drug regimen with *** control. {FINDINGS; POSITIVE NEGATIVE:409 200 0854} history of chronic kidney disease.  Last GFR ***. CKD {stage:30421363}. {FINDINGS; POSITIVE NEGATIVE:409 200 0854} history of chronic obstructive pulmonary disease, treated with ***.  FUNCTIONAL STATUS: ECOG performance status: {findings; ecog performance status:31780} Ambulatory status: {TNHAmbulation:25868}  [name] 1 AND 3 YEAR INDEX Male (2pts) 75-79 or 80-84 (2pts) >84 (3pts) Dependence in toileting (1pt) Partial or full dependence in dressing (1pt) History of malignant neoplasm (2pts) CHF (3pts) COPD (1pts) CKD (3pts)  0-3 pts 6% 1 year mortality ; 21% 3 year mortality 4-5 pts 12% 1 year mortality ; 36% 3 year mortality >5 pts 21% 1 year mortality; 54% 3 year mortality   Past Medical History:  Diagnosis Date   Anemia    Anxiety    History of recent blood transfusion    Hypertension    Lupus (HCC)    Renal disease     Past Surgical History:  Procedure Laterality Date   AV FISTULA PLACEMENT     ESOPHAGOGASTRODUODENOSCOPY (EGD) WITH PROPOFOL  N/A 04/08/2021   Procedure: ESOPHAGOGASTRODUODENOSCOPY (EGD) WITH  PROPOFOL ;  Surgeon: Alvis Jourdain, MD;  Location: [facility] ENDOSCOPY;  Service: Gastroenterology;  Laterality: N/A;   IR FLUORO GUIDE CV LINE RIGHT  09/23/2021   IR FLUORO GUIDE CV LINE RIGHT  02/23/2022   PERITONEAL CATHETER INSERTION     REVISON OF ARTERIOVENOUS FISTULA Right 09/12/2019   Procedure: 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  Social History   Socioeconomic History   Marital status: Single    Spouse name: Not on file   Number of children: Not on file   Years of education: 12   Highest education level: Not on file  Occupational History   Occupation: Disabled  Tobacco Use   Smoking status: Every Day    Current packs/day: 0.50    Average packs/day: 0.5 packs/day for 15.0 years (7.5 ttl pk-yrs)    Types: Cigarettes   Smokeless tobacco: Never  Vaping Use   Vaping status: Never Used  Substance and Sexual Activity   Alcohol use: Never   Drug use: Never   Sexual activity: Yes    Birth control/protection: Condom  Other Topics Concern   Not on file  Social History Narrative   Not on file   Social Drivers of Health   Financial  Resource Strain: Medium Risk (01/04/2021)   Received from [facility]   Overall Financial Resource Strain (CARDIA)    Difficulty of Paying Living Expenses: Somewhat hard  Food Insecurity: Not on file  Transportation Needs: Not on file  Physical Activity: Not on file  Stress: Stress Concern Present (01/04/2021)   Received from [facility] Occupational [facility] - Occupational Stress Questionnaire    Feeling of Stress : To some extent  Social Connections: Unknown (05/11/2021)   [facility]   Social Network    Social Network: Not on file  Intimate Partner Violence: Unknown (04/15/2021)   Received from [facility]   HITS    Physically Hurt: Not on file    Insult or Talk Down To: Not on file    Threaten Physical Harm: Not on file    Scream or Curse: Not on file    Allergies  Allergen Reactions   Vancomycin  Itching    Current Outpatient Medications  Medication Sig Dispense Refill   chlorproMAZINE  (THORAZINE ) 10 MG tablet Take 1 tablet (10 mg total) by mouth 3 (three) times daily as needed for hiccoughs. (Patient not taking: Reported on 02/21/2022) 20 tablet 0   ferric citrate  (AURYXIA ) 1 GM 210 MG(Fe) tablet Take 630 mg by mouth in the morning, at noon, and at bedtime.     furosemide  (LASIX ) 80 MG tablet Take 80 mg by mouth daily. (Patient not taking: Reported on 02/21/2022)     gabapentin  (NEURONTIN ) 100 MG capsule Take 1 capsule (100 mg total) by mouth 3 (three) times daily for 10 days. (Patient not taking: Reported on 02/21/2022) 30 capsule 0   LOKELMA 10 g PACK packet Take 1 packet by mouth daily.     ondansetron  (ZOFRAN ) 4 MG tablet Take 1 tablet (4 mg total) by mouth every 8 (eight) hours as needed for nausea. (Patient not taking: Reported on 02/21/2022) 20 tablet 0   pantoprazole  (PROTONIX ) 40 MG tablet Take 1 tablet (40 mg total) by mouth 2 (two) times daily. (Patient not taking: Reported on 02/21/2022) 60 tablet 0   sucralfate  (CARAFATE ) 1 GM/10ML suspension Take 10 mLs (1 g total) by mouth 4 (four) times daily -  with meals and at bedtime for 7 days. (Patient not taking: Reported on 02/21/2022) 280 mL 0   No current facility-administered medications for this visit.    PHYSICAL EXAM There were no vitals filed for this visit.  Constitutional: *** appearing. *** distress.  Appears *** nourished.  Neurologic: CN ***. *** focal findings. *** sensory loss. Psychiatric: *** Mood and affect symmetric and appropriate. Eyes: *** No icterus. No conjunctival pallor. Ears, nose, throat: *** mucous membranes moist. Midline trachea.  Cardiac: *** rate and rhythm.  Respiratory: *** unlabored. Abdominal: *** soft, non-tender, non-distended.  Peripheral vascular: *** Extremity: *** edema. *** cyanosis. *** pallor.  Skin: *** gangrene. *** ulceration.  Lymphatic: *** Stemmer's sign. *** palpable lymphadenopathy.    PERTINENT LABORATORY AND RADIOLOGIC DATA  Most recent CBC    Latest Ref Rng & Units 02/22/2022    4:30 AM 02/21/2022    5:02 PM 04/10/2021    1:33 AM  CBC  WBC 4.0 - 10.5 K/uL 3.7  3.7  4.6   Hemoglobin 13.0 - 17.0 g/dL 8.2  9.1  8.3   Hematocrit 39.0 - 52.0 % 26.0  28.0  25.5   Platelets 150 - 400 K/uL 109  130  125      Most recent CMP  Latest Ref Rng & Units 02/22/2022    4:30 AM 02/21/2022    5:02 PM 04/10/2021    1:33 AM  CMP  Glucose 70 - 99 mg/dL 75  92  97   BUN 6 - 20 mg/dL 40  37  8   Creatinine 0.61 - 1.24 mg/dL 27.25  36.64  4.03   Sodium 135 - 145 mmol/L 136  135  137   Potassium 3.5 - 5.1 mmol/L 3.8  3.4  3.9   Chloride 98 - 111 mmol/L 95  95  102   CO2 22 - 32 mmol/L 25  27  27    Calcium 8.9 - 10.3 mg/dL 8.3  8.4  8.4   Total Protein 6.5 - 8.1 g/dL  6.6  6.6   Total Bilirubin 0.3 - 1.2 mg/dL  0.4  0.2   Alkaline Phos 38 - 126 U/L  74  73   AST 15 - 41 U/L  9  14   ALT 0 - 44 U/L  5  9     Renal function CrCl cannot be calculated (Patient's most recent lab result is older than the maximum 21 days allowed.).  No results found for: HGBA1C  LDL Cholesterol  Date Value Ref Range Status  04/02/2021 45 0 - 99 mg/dL Final    Comment:           Total Cholesterol/HDL:CHD Risk Coronary Heart Disease Risk Table                     Men   Women  1/2 Average Risk   3.4   3.3  Average Risk       5.0   4.4  2 X Average Risk   9.6    7.1  3 X Average Risk  23.4   11.0        Use the calculated Patient Ratio above and the CHD Risk Table to determine the patient's CHD Risk.        ATP III CLASSIFICATION (LDL):  <100     mg/dL   Optimal  474-259  mg/dL   Near or Above                    Optimal  130-159  mg/dL   Borderline  563-875  mg/dL   High  >643     mg/dL   Very High Performed at [facility], [address], [place], [place] [address]      Vascular Imaging: ***  Heber Little. Edgardo Goodwill, MD FACS [facility] [place] Phone Number: [phone] 06/25/2023 9:27 PM   Total time spent on preparing this encounter including chart review, data review, collecting history, examining the patient, and coordinating care: {tnhtimebilling:26202} {billinglist:27273}  Portions of this report may have been transcribed using voice recognition software.  Every effort has been made to ensure accuracy; however, inadvertent computerized transcription errors may still be present.

## 2023-06-26 ENCOUNTER — Ambulatory Visit: Admitting: Vascular Surgery

## 2023-06-28 ENCOUNTER — Other Ambulatory Visit: Payer: Self-pay

## 2023-06-28 ENCOUNTER — Ambulatory Visit: Attending: Vascular Surgery | Admitting: Vascular Surgery

## 2023-06-28 ENCOUNTER — Encounter: Payer: Self-pay | Admitting: Vascular Surgery

## 2023-06-28 VITALS — BP 164/100 | HR 85 | Temp 98.0°F | Resp 18 | Ht 70.0 in | Wt 167.4 lb

## 2023-06-28 DIAGNOSIS — N186 End stage renal disease: Secondary | ICD-10-CM

## 2023-06-28 NOTE — Progress Notes (Signed)
 Office Note     CC: Purulence from left-sided access Requesting Provider:  Baron Border, MD  HPI: Nathan Castaneda is a 48 y.o. (12/06/1975) male who presents at the request of Baron Border, MD for evaluation of purulence from left-sided access.  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  In short, he has had multiple access to procedures to bilateral upper extremities.  He has a nonfunctioning left sided hero graft with 2 pinhole pustules that drain purulence when pressed.  I am unsure as to when these developed, but see he stated it happened several months ago.  He states he pops them on a regular basis and then just wiped the drainage on his pants.   Denies fevers, denies chills.  Has had failed left thigh graft.  Currently being dialyzed through a right thigh tunneled dialysis catheter.  He originally thought he was here to have this exchanged.  When asked about access was working, he said he had a normal run yesterday dialysis.   Past Medical History:  Diagnosis Date   Anemia    Anxiety    History of recent blood transfusion    Hypertension    Lupus    Renal disease     Past Surgical History:  Procedure Laterality Date   AV FISTULA PLACEMENT     ESOPHAGOGASTRODUODENOSCOPY (EGD) WITH PROPOFOL  N/A 04/08/2021   Procedure: ESOPHAGOGASTRODUODENOSCOPY (EGD) WITH PROPOFOL ;  Surgeon: Alvis Jourdain, MD;  Location: [facility] ENDOSCOPY;  Service: Gastroenterology;  Laterality: N/A;   IR FLUORO GUIDE CV LINE RIGHT  09/23/2021   IR FLUORO GUIDE CV LINE RIGHT  02/23/2022   PERITONEAL CATHETER INSERTION     REVISON OF ARTERIOVENOUS FISTULA Right 09/12/2019   Procedure: 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 artery with reversed saphenous vein from left ankle;  Surgeon: Mayo Speck, MD;  Location: [facility] OR;  Service: Vascular;  Laterality: Right;    VEIN HARVEST Left 09/12/2019   Procedure: SAPHENOUS VEIN HARVEST;  Surgeon: Mayo Speck, MD;  Location: [facility] OR;  Service: Vascular;  Laterality: Left;   WOUND DEBRIDEMENT Right 09/26/2019   Procedure: RIGHT UPPER EXTREMITY WOUND WASHOUT;  Surgeon: Mayo Speck, MD;  Location: [facility] OR;  Service: Vascular;  Laterality: Right;    Social History   Socioeconomic History   Marital status: Single    Spouse name: Not on file   Number of children: Not on file   Years of education: 12   Highest education level: Not on file  Occupational History   Occupation: Disabled  Tobacco Use   Smoking status: Every Day    Current packs/day: 0.50    Average packs/day: 0.5 packs/day for 15.0 years (7.5 ttl pk-yrs)    Types: Cigarettes   Smokeless tobacco: Never  Vaping Use   Vaping status: Never Used  Substance and Sexual Activity   Alcohol use: Never   Drug use: Never   Sexual activity: Yes    Birth control/protection: Condom  Other Topics Concern   Not on file  Social History Narrative   Not on file   Social Drivers of Health   Financial Resource Strain: Medium Risk (01/04/2021)   Received from [facility]   Overall Financial Resource Strain (CARDIA)    Difficulty of Paying Living Expenses: Somewhat hard  Food Insecurity: Not on file  Transportation Needs: Not on file  Physical Activity: Not  on file  Stress: Stress Concern Present (01/04/2021)   Received from [facility] of Occupational Health - Occupational Stress Questionnaire    Feeling of Stress : To some extent  Social Connections: Unknown (05/11/2021)   Received from [facility]   Social Network    Social Network: Not on file  Intimate Partner Violence: Unknown (04/15/2021)   Received from [facility]   HITS    Physically Hurt: Not on file    Insult or Talk Down To: Not on file    Threaten Physical Harm: Not on file    Scream or Curse: Not on file   History reviewed. No pertinent family  history.  Current Outpatient Medications  Medication Sig Dispense Refill   chlorproMAZINE  (THORAZINE ) 10 MG tablet Take 1 tablet (10 mg total) by mouth 3 (three) times daily as needed for hiccoughs. (Patient not taking: Reported on 02/21/2022) 20 tablet 0   ferric citrate  (AURYXIA ) 1 GM 210 MG(Fe) tablet Take 630 mg by mouth in the morning, at noon, and at bedtime.     furosemide  (LASIX ) 80 MG tablet Take 80 mg by mouth daily. (Patient not taking: Reported on 02/21/2022)     gabapentin  (NEURONTIN ) 100 MG capsule Take 1 capsule (100 mg total) by mouth 3 (three) times daily for 10 days. (Patient not taking: Reported on 02/21/2022) 30 capsule 0   LOKELMA 10 g PACK packet Take 1 packet by mouth daily.     ondansetron  (ZOFRAN ) 4 MG tablet Take 1 tablet (4 mg total) by mouth every 8 (eight) hours as needed for nausea. (Patient not taking: Reported on 02/21/2022) 20 tablet 0   pantoprazole  (PROTONIX ) 40 MG tablet Take 1 tablet (40 mg total) by mouth 2 (two) times daily. (Patient not taking: Reported on 02/21/2022) 60 tablet 0   sucralfate  (CARAFATE ) 1 GM/10ML suspension Take 10 mLs (1 g total) by mouth 4 (four) times daily -  with meals and at bedtime for 7 days. (Patient not taking: Reported on 02/21/2022) 280 mL 0   No current facility-administered medications for this visit.    Allergies  Allergen Reactions   Vancomycin  Itching     REVIEW OF SYSTEMS:   [X]  denotes positive finding, [ ]  denotes negative finding Cardiac  Comments:  Chest pain or chest pressure:    Shortness of breath upon exertion:    Short of breath when lying flat:    Irregular heart rhythm:        Vascular    Pain in calf, thigh, or hip brought on by ambulation:    Pain in feet at night that wakes you up from your sleep:     Blood clot in your veins:    Leg swelling:         Pulmonary    Oxygen at home:    Productive cough:     Wheezing:         Neurologic    Sudden weakness in arms or legs:     Sudden numbness in  arms or legs:     Sudden onset of difficulty speaking or slurred speech:    Temporary loss of vision in one eye:     Problems with dizziness:         Gastrointestinal    Blood in stool:     Vomited blood:         Genitourinary    Burning when urinating:     Blood in urine:  Psychiatric    Major depression:         Hematologic    Bleeding problems:    Problems with blood clotting too easily:        Skin    Rashes or ulcers:        Constitutional    Fever or chills:      PHYSICAL EXAMINATION:  Vitals:   06/28/23 1455  BP: (!) 164/100  Pulse: 85  Resp: 18  Temp: 98 F (36.7 C)  TempSrc: Temporal  SpO2: 96%  Weight: 167 lb 6.4 oz (75.9 kg)  Height: 5' 10 (1.778 m)    General:  WDWN in NAD; vital signs documented above Gait: Not observed HENT: WNL, normocephalic Pulmonary: normal non-labored breathing , without Rales, rhonchi,  wheezing Cardiac: regular HR Abdomen: soft, NT, no masses Skin: without rashes Vascular Exam/Pulses:  Right Left  Radial 2+ (normal) 2+ (normal)                       Extremities: without ischemic changes, without Gangrene , without cellulitis; without open wounds;  Musculoskeletal: no muscle wasting or atrophy  Neurologic: A&O X 3;  No focal weakness or paresthesias are detected Psychiatric:  The pt has Normal affect.   Non-Invasive Vascular Imaging:   none    ASSESSMENT/PLAN:: 48 y.o. male presenting with suspected infected left sided hero graft.  I had a long conversation with him regarding the above, noted most notably that with the purulence coming from the 2 pustules immediately anterior to the graft, I was worried that it may involve the graft. He is aware that removal will also require venous patch plasty of the left brachial artery.  I will likely harvest from one of his legs.  At this time, I have no plans to exchange the right thigh TDC as it is functioning.  If this changes, I am happy to exchange it at the  time of his surgery.  I am very concerned about personal hygiene and the risk of infection as he has had previous infections, and appears to be a picker.  During our office visit today, he would pop the pustules and then wipe his hands on his pants, returning to the pustule to remove more of the drainage.  I have discussed the risk and benefits of left arm hero graft removal, brachial artery patch plasty, he elected to proceed.    Kayla Part, MD [facility] [phone]

## 2023-07-04 ENCOUNTER — Telehealth: Payer: Self-pay

## 2023-07-04 NOTE — Telephone Encounter (Signed)
 Patient called to cancel surgery for 6/27 with Dr. Lanis due to lack of transportation.  His insurance does not offer transportation services.

## 2023-07-05 ENCOUNTER — Encounter (HOSPITAL_COMMUNITY): Payer: Self-pay | Admitting: Vascular Surgery

## 2023-07-05 NOTE — Progress Notes (Signed)
 SDW CALL  Unable to contact patient. Pre op instructions left on his voicemail. Alan Glance, RN in Dr. Aleta office notified.   PCP - Manuelita Norine COME Cardiologist - none  PPM/ICD - none Device Orders -  Rep Notified -   Chest x-ray - none EKG - DOS Stress Test -  ECHO - TTE-09/30/19 Cardiac Cath - none  Sleep Study - none CPAP -   Fasting Blood Sugar - na Checks Blood Sugar _____ times a day  Blood Thinner Instructions: na Aspirin Instructions:na  ERAS Protcol -no PRE-SURGERY Ensure or G2-   COVID TEST- na  Anesthesia review: no      Special instructions:    Oral Hygiene is also important to reduce your risk of infection.  Remember - BRUSH YOUR TEETH THE MORNING OF SURGERY WITH YOUR REGULAR TOOTHPASTE

## 2023-07-06 ENCOUNTER — Other Ambulatory Visit: Payer: Self-pay

## 2023-07-06 ENCOUNTER — Ambulatory Visit (HOSPITAL_COMMUNITY)

## 2023-07-06 ENCOUNTER — Inpatient Hospital Stay (HOSPITAL_COMMUNITY)
Admission: AD | Admit: 2023-07-06 | Discharge: 2023-07-11 | DRG: 252 | Disposition: A | Discharge status: Home / Self Care | Attending: Vascular Surgery | Admitting: Vascular Surgery

## 2023-07-06 ENCOUNTER — Encounter (HOSPITAL_COMMUNITY)
Admission: AD | Disposition: A | Discharge status: Home / Self Care | Payer: Self-pay | Source: Home / Self Care | Attending: Vascular Surgery

## 2023-07-06 ENCOUNTER — Ambulatory Visit (HOSPITAL_COMMUNITY): Admitting: Anesthesiology

## 2023-07-06 ENCOUNTER — Encounter (HOSPITAL_COMMUNITY): Payer: Self-pay | Admitting: Vascular Surgery

## 2023-07-06 DIAGNOSIS — I110 Hypertensive heart disease with heart failure: Secondary | ICD-10-CM

## 2023-07-06 DIAGNOSIS — Z5986 Financial insecurity: Secondary | ICD-10-CM | POA: Diagnosis not present

## 2023-07-06 DIAGNOSIS — T80211A Bloodstream infection due to central venous catheter, initial encounter: Principal | ICD-10-CM | POA: Diagnosis present

## 2023-07-06 DIAGNOSIS — I871 Compression of vein: Secondary | ICD-10-CM | POA: Diagnosis not present

## 2023-07-06 DIAGNOSIS — T82898A Other specified complication of vascular prosthetic devices, implants and grafts, initial encounter: Secondary | ICD-10-CM | POA: Diagnosis not present

## 2023-07-06 DIAGNOSIS — Z881 Allergy status to other antibiotic agents status: Secondary | ICD-10-CM

## 2023-07-06 DIAGNOSIS — F17210 Nicotine dependence, cigarettes, uncomplicated: Secondary | ICD-10-CM | POA: Diagnosis present

## 2023-07-06 DIAGNOSIS — I82210 Acute embolism and thrombosis of superior vena cava: Secondary | ICD-10-CM | POA: Diagnosis present

## 2023-07-06 DIAGNOSIS — Z992 Dependence on renal dialysis: Secondary | ICD-10-CM

## 2023-07-06 DIAGNOSIS — T827XXA Infection and inflammatory reaction due to other cardiac and vascular devices, implants and grafts, initial encounter: Principal | ICD-10-CM

## 2023-07-06 DIAGNOSIS — Y832 Surgical operation with anastomosis, bypass or graft as the cause of abnormal reaction of the patient, or of later complication, without mention of misadventure at the time of the procedure: Secondary | ICD-10-CM | POA: Diagnosis present

## 2023-07-06 DIAGNOSIS — D631 Anemia in chronic kidney disease: Secondary | ICD-10-CM | POA: Diagnosis present

## 2023-07-06 DIAGNOSIS — N186 End stage renal disease: Secondary | ICD-10-CM | POA: Diagnosis present

## 2023-07-06 DIAGNOSIS — I120 Hypertensive chronic kidney disease with stage 5 chronic kidney disease or end stage renal disease: Secondary | ICD-10-CM | POA: Diagnosis present

## 2023-07-06 DIAGNOSIS — I509 Heart failure, unspecified: Secondary | ICD-10-CM

## 2023-07-06 HISTORY — PX: EXCHANGE OF A DIALYSIS CATHETER: SHX5818

## 2023-07-06 HISTORY — PX: REMOVAL OF GRAFT: SHX6361

## 2023-07-06 HISTORY — PX: VEIN HARVEST: SHX6363

## 2023-07-06 HISTORY — PX: ARTERY REPAIR: SHX559

## 2023-07-06 LAB — POCT I-STAT, CHEM 8
BUN: 44 mg/dL — ABNORMAL HIGH (ref 6–20)
Calcium, Ion: 1.05 mmol/L — ABNORMAL LOW (ref 1.15–1.40)
Chloride: 93 mmol/L — ABNORMAL LOW (ref 98–111)
Creatinine, Ser: 12.2 mg/dL — ABNORMAL HIGH (ref 0.61–1.24)
Glucose, Bld: 77 mg/dL (ref 70–99)
HCT: 33 % — ABNORMAL LOW (ref 39.0–52.0)
Hemoglobin: 11.2 g/dL — ABNORMAL LOW (ref 13.0–17.0)
Potassium: 3.9 mmol/L (ref 3.5–5.1)
Sodium: 132 mmol/L — ABNORMAL LOW (ref 135–145)
TCO2: 29 mmol/L (ref 22–32)

## 2023-07-06 SURGERY — REMOVAL, GRAFT
Anesthesia: Monitor Anesthesia Care | Site: Leg Lower | Laterality: Right

## 2023-07-06 MED ORDER — GUAIFENESIN-DM 100-10 MG/5ML PO SYRP: 15.0000 mL | ORAL_SOLUTION | ORAL | Status: DC | PRN

## 2023-07-06 MED ORDER — SODIUM CHLORIDE 0.9% FLUSH: 3.0000 mL | INTRAVENOUS | Status: DC | PRN

## 2023-07-06 MED ORDER — PANTOPRAZOLE SODIUM 40 MG PO TBEC
40.0000 mg | DELAYED_RELEASE_TABLET | Freq: Every day | ORAL | Status: DC
  Administered 2023-07-06 – 2023-07-11 (×5): 40 mg via ORAL
  Filled 2023-07-06 (×5): qty 1

## 2023-07-06 MED ORDER — LABETALOL HCL 5 MG/ML IV SOLN
10.0000 mg | INTRAVENOUS | Status: DC | PRN
  Administered 2023-07-09 – 2023-07-10 (×3): 10 mg via INTRAVENOUS
  Filled 2023-07-06 (×2): qty 4

## 2023-07-06 MED ORDER — SODIUM CHLORIDE 0.9% FLUSH
3.0000 mL | Freq: Two times a day (BID) | INTRAVENOUS | Status: DC
  Administered 2023-07-06 – 2023-07-11 (×10): 3 mL via INTRAVENOUS

## 2023-07-06 MED ORDER — DEXAMETHASONE SODIUM PHOSPHATE 10 MG/ML IJ SOLN
INTRAMUSCULAR | Status: DC | PRN
  Administered 2023-07-06: 5 mg via INTRAVENOUS

## 2023-07-06 MED ORDER — SUGAMMADEX SODIUM 200 MG/2ML IV SOLN
INTRAVENOUS | Status: DC | PRN
  Administered 2023-07-06: 200 mg via INTRAVENOUS

## 2023-07-06 MED ORDER — SENNOSIDES-DOCUSATE SODIUM 8.6-50 MG PO TABS: 1.0000 | ORAL_TABLET | Freq: Every evening | ORAL | Status: DC | PRN

## 2023-07-06 MED ORDER — HEPARIN SODIUM (PORCINE) 1000 UNIT/ML IJ SOLN
INTRAMUSCULAR | Status: DC | PRN
  Administered 2023-07-06: 5000 [IU] via INTRAVENOUS

## 2023-07-06 MED ORDER — ORAL CARE MOUTH RINSE: 15.0000 mL | Freq: Once | OROMUCOSAL | Status: AC

## 2023-07-06 MED ORDER — SODIUM CHLORIDE 0.9 % IV SOLN
20.0000 ug | Freq: Once | INTRAVENOUS | Status: AC
  Administered 2023-07-06: 20 ug via INTRAVENOUS
  Filled 2023-07-06: qty 5

## 2023-07-06 MED ORDER — BISACODYL 5 MG PO TBEC: 5.0000 mg | DELAYED_RELEASE_TABLET | Freq: Every day | ORAL | Status: DC | PRN

## 2023-07-06 MED ORDER — MIDAZOLAM HCL 2 MG/2ML IJ SOLN
INTRAMUSCULAR | Status: AC
  Filled 2023-07-06: qty 2

## 2023-07-06 MED ORDER — SODIUM ZIRCONIUM CYCLOSILICATE 10 G PO PACK
10.0000 g | PACK | Freq: Every day | ORAL | Status: DC
  Administered 2023-07-07 – 2023-07-11 (×4): 10 g via ORAL
  Filled 2023-07-06 (×5): qty 1

## 2023-07-06 MED ORDER — PROTAMINE SULFATE 10 MG/ML IV SOLN
INTRAVENOUS | Status: DC | PRN
  Administered 2023-07-06: 20 mg via INTRAVENOUS

## 2023-07-06 MED ORDER — ACETAMINOPHEN 325 MG PO TABS
325.0000 mg | ORAL_TABLET | ORAL | Status: DC | PRN
  Administered 2023-07-09: 650 mg via ORAL
  Filled 2023-07-06: qty 2

## 2023-07-06 MED ORDER — MIDAZOLAM HCL 2 MG/2ML IJ SOLN: 1.0000 mg | Freq: Once | INTRAMUSCULAR | Status: AC

## 2023-07-06 MED ORDER — FENTANYL CITRATE (PF) 100 MCG/2ML IJ SOLN: 50.0000 ug | Freq: Once | INTRAMUSCULAR | Status: AC

## 2023-07-06 MED ORDER — 0.9 % SODIUM CHLORIDE (POUR BTL) OPTIME
TOPICAL | Status: DC | PRN
Start: 2023-07-06 — End: 2023-07-06
  Administered 2023-07-06: 1000 mL

## 2023-07-06 MED ORDER — DOCUSATE SODIUM 100 MG PO CAPS
100.0000 mg | ORAL_CAPSULE | Freq: Two times a day (BID) | ORAL | Status: DC
  Administered 2023-07-09: 100 mg via ORAL
  Filled 2023-07-06 (×8): qty 1

## 2023-07-06 MED ORDER — HEPARIN SODIUM (PORCINE) 5000 UNIT/ML IJ SOLN
5000.0000 [IU] | Freq: Three times a day (TID) | INTRAMUSCULAR | Status: DC
  Administered 2023-07-07 – 2023-07-10 (×10): 5000 [IU] via SUBCUTANEOUS
  Filled 2023-07-06 (×12): qty 1

## 2023-07-06 MED ORDER — FENTANYL CITRATE (PF) 250 MCG/5ML IJ SOLN
INTRAMUSCULAR | Status: DC | PRN
  Administered 2023-07-06 (×5): 50 ug via INTRAVENOUS

## 2023-07-06 MED ORDER — ONDANSETRON HCL 4 MG/2ML IJ SOLN
INTRAMUSCULAR | Status: DC | PRN
  Administered 2023-07-06: 4 mg via INTRAVENOUS

## 2023-07-06 MED ORDER — ROCURONIUM BROMIDE 100 MG/10ML IV SOLN
INTRAVENOUS | Status: DC | PRN
  Administered 2023-07-06: 30 mg via INTRAVENOUS

## 2023-07-06 MED ORDER — PHENOL 1.4 % MT LIQD: 1.0000 | OROMUCOSAL | Status: DC | PRN

## 2023-07-06 MED ORDER — FENTANYL CITRATE (PF) 100 MCG/2ML IJ SOLN
INTRAMUSCULAR | Status: AC
  Administered 2023-07-06: 50 ug via INTRAVENOUS
  Filled 2023-07-06: qty 2

## 2023-07-06 MED ORDER — SODIUM CHLORIDE 0.9 % IV SOLN
250.0000 mL | INTRAVENOUS | Status: AC | PRN
Start: 2023-07-06 — End: 2023-07-07

## 2023-07-06 MED ORDER — LIDOCAINE 2% (20 MG/ML) 5 ML SYRINGE
INTRAMUSCULAR | Status: DC | PRN
  Administered 2023-07-06: 40 mg via INTRAVENOUS

## 2023-07-06 MED ORDER — CEFAZOLIN SODIUM-DEXTROSE 2-4 GM/100ML-% IV SOLN
2.0000 g | Freq: Three times a day (TID) | INTRAVENOUS | Status: DC
  Administered 2023-07-06: 2 g via INTRAVENOUS

## 2023-07-06 MED ORDER — EPHEDRINE SULFATE-NACL 50-0.9 MG/10ML-% IV SOSY
PREFILLED_SYRINGE | INTRAVENOUS | Status: DC | PRN
  Administered 2023-07-06: 7.5 mg via INTRAVENOUS

## 2023-07-06 MED ORDER — HYDRALAZINE HCL 20 MG/ML IJ SOLN: 5.0000 mg | INTRAMUSCULAR | Status: DC | PRN

## 2023-07-06 MED ORDER — CHLORHEXIDINE GLUCONATE 0.12 % MT SOLN
15.0000 mL | Freq: Once | OROMUCOSAL | Status: AC
  Administered 2023-07-06: 15 mL via OROMUCOSAL
  Filled 2023-07-06: qty 15

## 2023-07-06 MED ORDER — HEPARIN SODIUM (PORCINE) 1000 UNIT/ML IJ SOLN
INTRAMUSCULAR | Status: DC | PRN
  Administered 2023-07-06: 6200 [IU]

## 2023-07-06 MED ORDER — CEFAZOLIN SODIUM-DEXTROSE 1-4 GM/50ML-% IV SOLN
1.0000 g | INTRAVENOUS | Status: DC
  Administered 2023-07-07 – 2023-07-10 (×4): 1 g via INTRAVENOUS
  Filled 2023-07-06 (×5): qty 50

## 2023-07-06 MED ORDER — PAPAVERINE HCL 30 MG/ML IJ SOLN
INTRAMUSCULAR | Status: AC
Start: 2023-07-06 — End: 2023-07-06
  Filled 2023-07-06: qty 2

## 2023-07-06 MED ORDER — PHENYLEPHRINE HCL-NACL 20-0.9 MG/250ML-% IV SOLN
INTRAVENOUS | Status: DC | PRN
Start: 2023-07-06 — End: 2023-07-06
  Administered 2023-07-06: 30 ug/min via INTRAVENOUS

## 2023-07-06 MED ORDER — SODIUM CHLORIDE 0.9 % IV SOLN: INTRAVENOUS | Status: AC

## 2023-07-06 MED ORDER — FENTANYL CITRATE (PF) 100 MCG/2ML IJ SOLN
25.0000 ug | INTRAMUSCULAR | Status: DC | PRN
  Administered 2023-07-06: 25 ug via INTRAVENOUS

## 2023-07-06 MED ORDER — POTASSIUM CHLORIDE CRYS ER 20 MEQ PO TBCR: 20.0000 meq | EXTENDED_RELEASE_TABLET | Freq: Once | ORAL | Status: DC | PRN

## 2023-07-06 MED ORDER — FENTANYL CITRATE (PF) 250 MCG/5ML IJ SOLN
INTRAMUSCULAR | Status: AC
  Filled 2023-07-06: qty 5

## 2023-07-06 MED ORDER — LIDOCAINE HCL (PF) 1 % IJ SOLN
INTRAMUSCULAR | Status: AC
  Filled 2023-07-06: qty 30

## 2023-07-06 MED ORDER — LACTATED RINGERS IV SOLN: INTRAVENOUS | Status: DC

## 2023-07-06 MED ORDER — FENTANYL CITRATE (PF) 100 MCG/2ML IJ SOLN
INTRAMUSCULAR | Status: AC
  Filled 2023-07-06: qty 2

## 2023-07-06 MED ORDER — CEFAZOLIN SODIUM-DEXTROSE 2-4 GM/100ML-% IV SOLN
INTRAVENOUS | Status: AC
  Filled 2023-07-06: qty 100

## 2023-07-06 MED ORDER — PROPOFOL 10 MG/ML IV BOLUS
INTRAVENOUS | Status: DC | PRN
  Administered 2023-07-06: 50 mg via INTRAVENOUS
  Administered 2023-07-06: 120 mg via INTRAVENOUS
  Administered 2023-07-06: 30 mg via INTRAVENOUS

## 2023-07-06 MED ORDER — ALUM & MAG HYDROXIDE-SIMETH 200-200-20 MG/5ML PO SUSP: 15.0000 mL | ORAL | Status: DC | PRN

## 2023-07-06 MED ORDER — HEPARIN 6000 UNIT IRRIGATION SOLUTION
Status: DC | PRN
  Administered 2023-07-06: 1

## 2023-07-06 MED ORDER — HEMOSTATIC AGENTS (NO CHARGE) OPTIME
TOPICAL | Status: DC | PRN
  Administered 2023-07-06: 1 via TOPICAL

## 2023-07-06 MED ORDER — FLEET ENEMA RE ENEM: 1.0000 | ENEMA | Freq: Once | RECTAL | Status: DC | PRN

## 2023-07-06 MED ORDER — CHLORHEXIDINE GLUCONATE CLOTH 2 % EX PADS
6.0000 | MEDICATED_PAD | Freq: Every day | CUTANEOUS | Status: DC
  Administered 2023-07-07: 6 via TOPICAL

## 2023-07-06 MED ORDER — HEPARIN SODIUM (PORCINE) 1000 UNIT/ML IJ SOLN
INTRAMUSCULAR | Status: AC
  Filled 2023-07-06: qty 10

## 2023-07-06 MED ORDER — ACETAMINOPHEN 10 MG/ML IV SOLN
1000.0000 mg | Freq: Once | INTRAVENOUS | Status: DC | PRN
  Administered 2023-07-06: 1000 mg via INTRAVENOUS

## 2023-07-06 MED ORDER — OXYCODONE HCL 5 MG PO TABS
5.0000 mg | ORAL_TABLET | ORAL | Status: DC | PRN
  Administered 2023-07-07 – 2023-07-09 (×3): 5 mg via ORAL
  Filled 2023-07-06: qty 1
  Filled 2023-07-06: qty 2
  Filled 2023-07-06 (×2): qty 1
  Filled 2023-07-06: qty 2

## 2023-07-06 MED ORDER — CEFAZOLIN SODIUM-DEXTROSE 2-4 GM/100ML-% IV SOLN
2.0000 g | INTRAVENOUS | Status: AC
  Administered 2023-07-06: 2 g via INTRAVENOUS
  Filled 2023-07-06: qty 100

## 2023-07-06 MED ORDER — CHLORHEXIDINE GLUCONATE 4 % EX SOLN: 60.0000 mL | Freq: Once | CUTANEOUS | Status: DC

## 2023-07-06 MED ORDER — SUCCINYLCHOLINE CHLORIDE 200 MG/10ML IV SOSY
PREFILLED_SYRINGE | INTRAVENOUS | Status: DC | PRN
  Administered 2023-07-06: 120 mg via INTRAVENOUS

## 2023-07-06 MED ORDER — PHENYLEPHRINE 80 MCG/ML (10ML) SYRINGE FOR IV PUSH (FOR BLOOD PRESSURE SUPPORT)
PREFILLED_SYRINGE | INTRAVENOUS | Status: DC | PRN
  Administered 2023-07-06 (×4): 80 ug via INTRAVENOUS
  Administered 2023-07-06: 160 ug via INTRAVENOUS

## 2023-07-06 MED ORDER — HEPARIN 6000 UNIT IRRIGATION SOLUTION
Status: AC
  Filled 2023-07-06: qty 500

## 2023-07-06 MED ORDER — MIDAZOLAM HCL 2 MG/2ML IJ SOLN
INTRAMUSCULAR | Status: AC
  Administered 2023-07-06: 1 mg via INTRAVENOUS
  Filled 2023-07-06: qty 2

## 2023-07-06 MED ORDER — SODIUM CHLORIDE 0.9% FLUSH: 3.0000 mL | Freq: Two times a day (BID) | INTRAVENOUS | Status: DC

## 2023-07-06 MED ORDER — ACETAMINOPHEN 10 MG/ML IV SOLN
INTRAVENOUS | Status: AC
  Filled 2023-07-06: qty 100

## 2023-07-06 MED ORDER — ONDANSETRON HCL 4 MG/2ML IJ SOLN: 4.0000 mg | Freq: Four times a day (QID) | INTRAMUSCULAR | Status: DC | PRN

## 2023-07-06 MED ORDER — ACETAMINOPHEN 325 MG RE SUPP: 325.0000 mg | RECTAL | Status: DC | PRN

## 2023-07-06 MED ORDER — HYDROMORPHONE HCL 1 MG/ML IJ SOLN
0.5000 mg | INTRAMUSCULAR | Status: DC | PRN
  Administered 2023-07-09 – 2023-07-10 (×2): 0.5 mg via INTRAVENOUS
  Filled 2023-07-06 (×2): qty 0.5

## 2023-07-06 MED ORDER — MEPIVACAINE HCL (PF) 2 % IJ SOLN
INTRAMUSCULAR | Status: DC | PRN
  Administered 2023-07-06: 20 mL

## 2023-07-06 MED ORDER — METOPROLOL TARTRATE 5 MG/5ML IV SOLN: 2.0000 mg | INTRAVENOUS | Status: DC | PRN

## 2023-07-06 MED ORDER — FERRIC CITRATE 1 GM 210 MG(FE) PO TABS
630.0000 mg | ORAL_TABLET | Freq: Three times a day (TID) | ORAL | Status: DC
Start: 2023-07-06 — End: 2023-07-11
  Administered 2023-07-06 – 2023-07-11 (×8): 630 mg via ORAL
  Filled 2023-07-06 (×15): qty 3

## 2023-07-06 SURGICAL SUPPLY — 79 items
BAG COUNTER SPONGE SURGICOUNT (BAG) ×3 IMPLANT
BANDAGE ESMARK 6X9 LF (GAUZE/BANDAGES/DRESSINGS) IMPLANT
BNDG COMPR ESMARK 4X3 LF (GAUZE/BANDAGES/DRESSINGS) IMPLANT
BNDG ELASTIC 4INX 5YD STR LF (GAUZE/BANDAGES/DRESSINGS) IMPLANT
BNDG ELASTIC 4X5.8 VLCR STR LF (GAUZE/BANDAGES/DRESSINGS) ×3 IMPLANT
BNDG ELASTIC 6INX 5YD STR LF (GAUZE/BANDAGES/DRESSINGS) IMPLANT
BNDG ELASTIC 6X15 VLCR STRL LF (GAUZE/BANDAGES/DRESSINGS) IMPLANT
BNDG GAUZE DERMACEA FLUFF 4 (GAUZE/BANDAGES/DRESSINGS) IMPLANT
BNDG STRETCH 4X75 STRL LF (GAUZE/BANDAGES/DRESSINGS) IMPLANT
CANISTER SUCT 1200ML W/VALVE (MISCELLANEOUS) ×3 IMPLANT
CANISTER SUCTION 3000ML PPV (SUCTIONS) ×3 IMPLANT
CANNULA VESSEL 3MM 2 BLNT TIP (CANNULA) IMPLANT
CLIP TI MEDIUM 24 (CLIP) ×3 IMPLANT
CLIP TI MEDIUM 6 (CLIP) ×6 IMPLANT
CLIP TI WIDE RED SMALL 24 (CLIP) ×3 IMPLANT
CLIP TI WIDE RED SMALL 6 (CLIP) ×3 IMPLANT
COVER PROBE W GEL 5X96 (DRAPES) IMPLANT
CUFF TOURN SGL QUICK 18X4 (TOURNIQUET CUFF) IMPLANT
CUFF TOURN SGL QUICK 42 (TOURNIQUET CUFF) IMPLANT
CUFF TRNQT CYL 24X4X16.5-23 (TOURNIQUET CUFF) IMPLANT
CUFF TRNQT CYL 34X4.125X (TOURNIQUET CUFF) IMPLANT
DERMABOND ADVANCED .7 DNX12 (GAUZE/BANDAGES/DRESSINGS) ×3 IMPLANT
DRAPE C-ARM 42X72 X-RAY (DRAPES) IMPLANT
DRAPE SURG ORHT 6 SPLT 77X108 (DRAPES) IMPLANT
DRAPE X-RAY CASS 24X20 (DRAPES) IMPLANT
DRSG OPSITE POSTOP 4X8 (GAUZE/BANDAGES/DRESSINGS) IMPLANT
ELECTRODE REM PT RTRN 9FT ADLT (ELECTROSURGICAL) ×3 IMPLANT
EVACUATOR SILICONE 100CC (DRAIN) IMPLANT
GAUZE SPONGE 4X4 12PLY STRL (GAUZE/BANDAGES/DRESSINGS) IMPLANT
GLIDEWIRE ADV .035X180CM (WIRE) IMPLANT
GLOVE BIOGEL PI IND STRL 7.0 (GLOVE) IMPLANT
GLOVE BIOGEL PI IND STRL 7.5 (GLOVE) ×3 IMPLANT
GLOVE BIOGEL PI IND STRL 8 (GLOVE) ×3 IMPLANT
GLOVE SURG SS PI 7.5 STRL IVOR (GLOVE) ×3 IMPLANT
GLOVE SURG SS PI 8.0 STRL IVOR (GLOVE) IMPLANT
GOWN STRL REUS W/ TWL LRG LVL3 (GOWN DISPOSABLE) ×6 IMPLANT
GOWN STRL REUS W/TWL 2XL LVL3 (GOWN DISPOSABLE) ×6 IMPLANT
HEMOSTAT SNOW SURGICEL 2X4 (HEMOSTASIS) IMPLANT
INSERT FOGARTY SM (MISCELLANEOUS) IMPLANT
KIT BASIN OR (CUSTOM PROCEDURE TRAY) ×3 IMPLANT
KIT CATH PALINDROME-P 55 (CATHETERS) IMPLANT
KIT TURNOVER KIT B (KITS) ×3 IMPLANT
LOOP VESSEL MINI RED (MISCELLANEOUS) IMPLANT
MARKER GRAFT CORONARY BYPASS (MISCELLANEOUS) IMPLANT
MARKER SKIN DUAL TIP RULER LAB (MISCELLANEOUS) IMPLANT
NS IRRIG 1000ML POUR BTL (IV SOLUTION) ×6 IMPLANT
PACK CV ACCESS (CUSTOM PROCEDURE TRAY) ×3 IMPLANT
PACK PERIPHERAL VASCULAR (CUSTOM PROCEDURE TRAY) ×3 IMPLANT
PAD ARMBOARD POSITIONER FOAM (MISCELLANEOUS) ×6 IMPLANT
POWDER MYRIAD MORCELLS 1000MG (Miscellaneous) IMPLANT
SET COLLECT BLD 21X3/4 12 (NEEDLE) IMPLANT
SHEET MEDIUM DRAPE 40X70 STRL (DRAPES) IMPLANT
SPIKE FLUID TRANSFER (MISCELLANEOUS) IMPLANT
SPONGE T-LAP 18X18 ~~LOC~~+RFID (SPONGE) IMPLANT
STAPLER SKIN PROX 35W (STAPLE) IMPLANT
STOPCOCK 4 WAY LG BORE MALE ST (IV SETS) IMPLANT
SUT ETHILON 3 0 PS 1 (SUTURE) IMPLANT
SUT ETHILON 4 0 PS 2 18 (SUTURE) IMPLANT
SUT GORETEX 6.0 TT9 (SUTURE) IMPLANT
SUT MNCRL AB 4-0 PS2 18 (SUTURE) ×3 IMPLANT
SUT PDS AB 0 CT1 27 (SUTURE) IMPLANT
SUT PROLENE 5 0 C 1 24 (SUTURE) ×3 IMPLANT
SUT PROLENE 6 0 BV (SUTURE) ×3 IMPLANT
SUT PROLENE 7 0 BV 1 (SUTURE) IMPLANT
SUT SILK 2 0 SH (SUTURE) ×3 IMPLANT
SUT SILK 3-0 18XBRD TIE 12 (SUTURE) IMPLANT
SUT VIC AB 2-0 CT1 TAPERPNT 27 (SUTURE) ×6 IMPLANT
SUT VIC AB 3-0 SH 27X BRD (SUTURE) ×6 IMPLANT
SUT VIC AB 4-0 PS2 18 (SUTURE) ×6 IMPLANT
SWAB COLLECTION DEVICE MRSA (MISCELLANEOUS) IMPLANT
SWAB CULTURE ESWAB REG 1ML (MISCELLANEOUS) IMPLANT
TAPE UMBILICAL 1/8X30 (MISCELLANEOUS) IMPLANT
TOWEL GREEN STERILE (TOWEL DISPOSABLE) ×3 IMPLANT
TOWEL GREEN STERILE FF (TOWEL DISPOSABLE) ×3 IMPLANT
TRAY FOLEY MTR SLVR 16FR STAT (SET/KITS/TRAYS/PACK) ×3 IMPLANT
TUBING EXTENTION W/L.L. (IV SETS) IMPLANT
UNDERPAD 30X36 HEAVY ABSORB (UNDERPADS AND DIAPERS) ×3 IMPLANT
WATER STERILE IRR 1000ML POUR (IV SOLUTION) ×3 IMPLANT
WIRE BENTSON .035X145CM (WIRE) IMPLANT

## 2023-07-06 NOTE — Anesthesia Procedure Notes (Signed)
 Anesthesia Regional Block: Supraclavicular block   Pre-Anesthetic Checklist: , timeout performed,  Correct Patient, Correct Site, Correct Laterality,  Correct Procedure, Correct Position, site marked,  Risks and benefits discussed,  Surgical consent,  Pre-op evaluation,  At surgeon's request and post-op pain management  Laterality: Left  Prep: Dura Prep       Needles:  Injection technique: Single-shot  Needle Type: Echogenic Stimulator Needle     Needle Length: 5cm  Needle Gauge: 20     Additional Needles:   Procedures:,,,, ultrasound used (permanent image in chart),,    Narrative:  Start time: 07/06/2023 10:28 AM End time: 07/06/2023 10:31 AM Injection made incrementally with aspirations every 5 mL.  Performed by: Personally  Anesthesiologist: Dorethea Cordella SQUIBB, DO  Additional Notes: Patient identified. Risks/Benefits/Options discussed with patient including but not limited to bleeding, infection, nerve damage, failed block, incomplete pain control. Patient expressed understanding and wished to proceed. All questions were answered. Sterile technique was used throughout the entire procedure. Please see nursing notes for vital signs. Aspirated in 5cc intervals with injection for negative confirmation. Patient was given instructions on fall risk and not to get out of bed. All questions and concerns addressed with instructions to call with any issues or inadequate analgesia.

## 2023-07-06 NOTE — Consult Note (Incomplete)
 Renal Service Consult Note [facility]  Nathan Castaneda 07/06/2023 Nathan JONETTA Fret, MD Requesting Physician: Dr. Silver  Reason for Consult: ESRD patient admitted for graft removal HPI: The patient is a 48 y.o. year-old w/ PMH as below who presented for purulence from his left arm access.  Per [facility] patient has had multiple access procedures in both upper extremities.  His nonfunctioning left arm hero graft was draining pus when [facility] examined him.  He was brought to the hospital for removal of the graft.  His right femoral TDC was removed and replaced at the same time yesterday. We are asked to see for dialysis.   Pt seen in hospital room.  No complaints at this time recovering from the surgery well.  No leg swelling or shortness of breath.   ROS - denies CP, no joint pain, no HA, no blurry vision, no rash, no diarrhea, no nausea/ vomiting   Past Medical History  Past Medical History:  Diagnosis Date   Anemia    Anxiety    History of recent blood transfusion    Hypertension    Lupus    Renal disease    Past Surgical History  Past Surgical History:  Procedure Laterality Date   AV FISTULA PLACEMENT     ESOPHAGOGASTRODUODENOSCOPY (EGD) WITH PROPOFOL  N/A 04/08/2021   Procedure: ESOPHAGOGASTRODUODENOSCOPY (EGD) WITH PROPOFOL ;  Surgeon: Nathan Dover, MD;  Location: [facility] ENDOSCOPY;  Service: Gastroenterology;  Laterality: N/A;   IR FLUORO GUIDE CV LINE RIGHT  09/23/2021   IR FLUORO GUIDE CV LINE RIGHT  02/23/2022   PERITONEAL CATHETER INSERTION     REVISON OF ARTERIOVENOUS FISTULA Right 09/12/2019   Procedure: 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 artery with reversed saphenous vein from left ankle;  Surgeon: Nathan Krystal FALCON, MD;  Location: [facility] OR;  Service: Vascular;  Laterality: Right;   VEIN HARVEST Left 09/12/2019   Procedure: SAPHENOUS VEIN HARVEST;  Surgeon: Nathan Krystal FALCON, MD;  Location: [facility] OR;  Service: Vascular;  Laterality: Left;   WOUND  DEBRIDEMENT Right 09/26/2019   Procedure: RIGHT UPPER EXTREMITY WOUND WASHOUT;  Surgeon: Nathan Krystal FALCON, MD;  Location: [facility] OR;  Service: Vascular;  Laterality: Right;   Family History History reviewed. No pertinent family history. Social History  reports that he has been smoking cigarettes. He has a 7.5 pack-year smoking history. He has never used smokeless tobacco. He reports that he does not drink alcohol and does not use drugs. Allergies  Allergies  Allergen Reactions   Vancomycin  Itching   Home medications Prior to Admission medications   Medication Sig Start Date End Date Taking? Authorizing Provider  ferric citrate  (AURYXIA ) 1 GM 210 MG(Fe) tablet Take 630 mg by mouth in the morning, at noon, and at bedtime.   Yes [provider]  LOKELMA 10 g PACK packet Take 1 packet by mouth daily.   Yes [provider]  chlorproMAZINE  (THORAZINE ) 10 MG tablet Take 1 tablet (10 mg total) by mouth 3 (three) times daily as needed for hiccoughs. Patient not taking: Reported on 02/21/2022 04/10/21   Singh, Prashant K, MD  furosemide  (LASIX ) 80 MG tablet Take 80 mg by mouth daily. Patient not taking: Reported on 02/21/2022 02/04/18   [provider]  gabapentin  (NEURONTIN ) 100 MG capsule Take 1 capsule (100 mg total) by mouth 3 (three) times daily for 10 days. Patient not taking: Reported on 02/21/2022 04/10/21 04/20/21  Nathan Lavada POUR, MD  ondansetron  (ZOFRAN ) 4 MG tablet Take  1 tablet (4 mg total) by mouth every 8 (eight) hours as needed for nausea. Patient not taking: Reported on 02/21/2022 04/10/21   Singh, Prashant K, MD  pantoprazole  (PROTONIX ) 40 MG tablet Take 1 tablet (40 mg total) by mouth 2 (two) times daily. Patient not taking: Reported on 02/21/2022 04/10/21   Nathan Lavada POUR, MD  sucralfate  (CARAFATE ) 1 GM/10ML suspension Take 10 mLs (1 g total) by mouth 4 (four) times daily -  with meals and at bedtime for 7 days. Patient not taking: Reported on 02/21/2022 04/10/21 04/17/21   Nathan Lavada POUR, MD     Vitals:   07/06/23 1530 07/06/23 1545 07/06/23 1600 07/06/23 1615  BP: (!) 135/94 133/89 132/89 (!) 135/94  Pulse:      Resp: 15 14 10 12   Temp:    97.7 F (36.5 C)  TempSrc:      SpO2: 96% 96% 97% 97%  Weight:      Height:       Exam Gen alert, no distress, on RA No rash, cyanosis or gangrene Sclera anicteric, throat clear  No jvd or bruits Chest clear bilat to bases, no rales/ wheezing RRR no MRG Abd soft ntnd no mass or ascites +bs GU defer MS no joint effusions or deformity Ext trace LE or UE edema Neuro is alert, Ox 3 , nf    R fem TDC intact     OP HD: [facility] MWF  4h   B400   69.1kg   2K   TDC  Heparin  none Last OP HD 6/25, post wt 73kg   Assessment/ Plan: HeRO graft resection: done 6/27 by [facility].  ESRD: on HD MWF. Missed HD yesterday, plan HD today.  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     Nathan Fret  MD [facility] 07/06/2023, 4:30 PM  Recent Labs  Lab 07/06/23 0835  HGB 11.2*  CREATININE 12.20*  Castaneda 3.9   Inpatient medications:  chlorhexidine   60 mL Topical Once   sodium chloride  flush  3-10 mL Intravenous Q12H    sodium chloride  10 mL/hr at 07/06/23 1403   acetaminophen  1,000 mg (07/06/23 1459)   acetaminophen , fentaNYL  (SUBLIMAZE ) injection, sodium chloride  flush

## 2023-07-06 NOTE — Transfer of Care (Signed)
 Immediate Anesthesia Transfer of Care Note  Patient: Nathan Castaneda  Procedure(s) Performed: REMOVAL LEFT ARM HERO GRAFT (Left: Arm Upper) PRIMARY END TO END ANASTOMOSIS OF LEFT BRACHIAL ARTERY (Left: Arm Upper) RIGHT GREATER SAPHENECTOMY (Right: Leg Lower) EXCHANGE OF RIGHT FEMORAL VEIN DIALYSIS CATHETER (Right: Groin)  Patient Location: PACU  Anesthesia Type:General and Regional  Level of Consciousness: awake  Airway & Oxygen Therapy: Patient Spontanous Breathing  Post-op Assessment: Report given to RN  Post vital signs: Reviewed and stable  Last Vitals:  Vitals Value Taken Time  BP 148/87 07/06/23 15:15  Temp    Pulse 75 07/06/23 15:18  Resp 19 07/06/23 15:18  SpO2 95 % 07/06/23 15:18  Vitals shown include unfiled device data.  Last Pain:  Vitals:   07/06/23 1500  TempSrc:   PainSc: Asleep         Complications: No notable events documented.

## 2023-07-06 NOTE — Plan of Care (Signed)

## 2023-07-06 NOTE — Progress Notes (Addendum)
 Asked to see pt for hemodialysis. Pt is s/p HeRO graft resection and new TDC placement. Labs stable, orders in for HD tomorrow. Will do formal consult tomorrow.   Myer Fret  MD  [facility] 07/06/2023, 4:33 PM  Recent Labs  Lab 07/06/23 0835  HGB 11.2*  CREATININE 12.20*  K 3.9

## 2023-07-06 NOTE — Anesthesia Preprocedure Evaluation (Addendum)
 Anesthesia Evaluation  Patient identified by MRN, date of birth, ID band Patient awake    Reviewed: Allergy & Precautions, NPO status , Patient's Chart, lab work & pertinent test results  Airway Mallampati: II  TM Distance: >3 FB Neck ROM: Full    Dental no notable dental hx.    Pulmonary Current Smoker   Pulmonary exam normal        Cardiovascular hypertension, +CHF   Rhythm:Regular Rate:Normal     Neuro/Psych  Headaches  Anxiety        GI/Hepatic Neg liver ROS,GERD  Medicated,,  Endo/Other  negative endocrine ROS    Renal/GU ESRFRenal disease  negative genitourinary   Musculoskeletal negative musculoskeletal ROS (+)    Abdominal Normal abdominal exam  (+)   Peds  Hematology  (+) Blood dyscrasia, anemia Lab Results      Component                Value               Date                      WBC                      3.7 (L)             02/22/2022                HGB                      11.2 (L)            07/06/2023                HCT                      33.0 (L)            07/06/2023                MCV                      80.2                02/22/2022                PLT                      109 (L)             02/22/2022              Anesthesia Other Findings   Reproductive/Obstetrics                             Anesthesia Physical Anesthesia Plan  ASA: 3  Anesthesia Plan: Regional and General   Post-op Pain Management: Regional block*   Induction: Intravenous  PONV Risk Score and Plan: Ondansetron , Dexamethasone , Midazolam , Treatment may vary due to age or medical condition and Propofol  infusion  Airway Management Planned: Mask and Oral ETT  Additional Equipment: None  Intra-op Plan:   Post-operative Plan: Extubation in OR  Informed Consent: I have reviewed the patients History and Physical, chart, labs and discussed the procedure including the risks, benefits and  alternatives for the proposed anesthesia with the patient or authorized representative who has indicated his/her understanding and acceptance.  Dental advisory given  Plan Discussed with: CRNA  Anesthesia Plan Comments:        Anesthesia Quick Evaluation

## 2023-07-06 NOTE — Anesthesia Procedure Notes (Signed)
 Procedure Name: Intubation Date/Time: 07/06/2023 11:17 AM  Performed by: Cindie Ronal POUR, CRNAPre-anesthesia Checklist: Patient identified, Emergency Drugs available, Suction available and Patient being monitored Patient Re-evaluated:Patient Re-evaluated prior to induction Oxygen Delivery Method: Circle System Utilized Preoxygenation: Pre-oxygenation with 100% oxygen Induction Type: IV induction Ventilation: Mask ventilation without difficulty Laryngoscope Size: Miller and 2 Grade View: Grade I Tube type: Oral Tube size: 7.5 mm Number of attempts: 1 Airway Equipment and Method: Stylet and Oral airway Placement Confirmation: ETT inserted through vocal cords under direct vision, positive ETCO2 and breath sounds checked- equal and bilateral Secured at: 23 cm Tube secured with: Tape Dental Injury: Teeth and Oropharynx as per pre-operative assessment

## 2023-07-06 NOTE — H&P (Signed)
 Office Note   Patient seen and examined in preop holding.  No complaints. No changes to medication history or physical exam since last seen in clinic. After discussing the risks and benefits of left arm HERO graft removal, brachial artery repair, TDC exchange, Nathan Castaneda elected to proceed.   Nathan Castaneda   CC: Purulence from left-sided access Requesting Provider:  No ref. provider found  HPI: Nathan Castaneda is a 48 y.o. (December 25, 1975) male who presents at the request of Nathan Manuelita LABOR, Castaneda for evaluation of purulence from left-sided access.  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  In short, he has had multiple access to procedures to bilateral upper extremities.  He has a nonfunctioning left sided hero graft with 2 pinhole pustules that drain purulence when pressed.  I am unsure as to when these developed, but see he stated it happened several months ago.  He states he pops them on a regular basis and then just wiped the drainage on his pants.   Denies fevers, denies chills.  Has had failed left thigh graft.  Currently being dialyzed through a right thigh tunneled dialysis catheter.  He originally thought he was here to have this exchanged.  When asked about access was working, he said he had a normal run yesterday dialysis.   Past Medical History:  Diagnosis Date   Anemia    Anxiety    History of recent blood transfusion    Hypertension    Lupus    Renal disease     Past Surgical History:  Procedure Laterality Date   AV FISTULA PLACEMENT     ESOPHAGOGASTRODUODENOSCOPY (EGD) WITH PROPOFOL  N/A 04/08/2021   Procedure: ESOPHAGOGASTRODUODENOSCOPY (EGD) WITH PROPOFOL ;  Surgeon: Rollin Dover, Castaneda;  Location: [facility] ENDOSCOPY;  Service: Gastroenterology;  Laterality: N/A;   IR FLUORO GUIDE CV LINE RIGHT  09/23/2021   IR FLUORO GUIDE CV LINE RIGHT   02/23/2022   PERITONEAL CATHETER INSERTION     REVISON OF ARTERIOVENOUS FISTULA Right 09/12/2019   Procedure: 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 artery with reversed saphenous vein from left ankle;  Surgeon: Oris Krystal FALCON, Castaneda;  Location: [facility] OR;  Service: Vascular;  Laterality: Right;   VEIN HARVEST Left 09/12/2019   Procedure: SAPHENOUS VEIN HARVEST;  Surgeon: Oris Krystal FALCON, Castaneda;  Location: [facility] OR;  Service: Vascular;  Laterality: Left;   WOUND DEBRIDEMENT Right 09/26/2019   Procedure: RIGHT UPPER EXTREMITY WOUND WASHOUT;  Surgeon: Oris Krystal FALCON, Castaneda;  Location: [facility] OR;  Service: Vascular;  Laterality: Right;    Social History   Socioeconomic History   Marital status: Single    Spouse name: Not on file   Number of children: Not on file   Years of education: 12   Highest education level: Not on file  Occupational History   Occupation: Disabled  Tobacco Use   Smoking status: Every Day    Current packs/day: 0.50    Average packs/day: 0.5 packs/day for 15.0 years (7.5 ttl pk-yrs)    Types: Cigarettes   Smokeless tobacco: Never  Vaping Use   Vaping status: Never Used  Substance and Sexual Activity   Alcohol use: Never   Drug use: Never   Sexual activity: Yes    Birth control/protection: Condom  Other Topics Concern   Not on file  Social History Narrative   Not on file  Social Drivers of Health   Financial Resource Strain: Medium Risk (01/04/2021)   Received from [facility]   Overall Financial Resource Strain (CARDIA)    Difficulty of Paying Living Expenses: Somewhat hard  Food Insecurity: Not on file  Transportation Needs: Not on file  Physical Activity: Not on file  Stress: Stress Concern Present (01/04/2021)   Received from [facility] of Occupational Health - Occupational Stress Questionnaire    Feeling of Stress : To some extent  Social Connections: Unknown (05/11/2021)   Received from [facility]   Social Network     Social Network: Not on file  Intimate Partner Violence: Unknown (04/15/2021)   Received from [facility]   HITS    Physically Hurt: Not on file    Insult or Talk Down To: Not on file    Threaten Physical Harm: Not on file    Scream or Curse: Not on file   History reviewed. No pertinent family history.  Current Facility-Administered Medications  Medication Dose Route Frequency Provider Last Rate Last Admin   0.9 %  sodium chloride  infusion   Intravenous Continuous Stoltzfus, Cordella SQUIBB, DO 10 mL/hr at 07/06/23 0832 New Bag at 07/06/23 9167   ceFAZolin  (ANCEF ) IVPB 2g/100 mL premix  2 g Intravenous 30 min Pre-Op Lanis Nathan BRAVO, Castaneda       chlorhexidine  (HIBICLENS ) 4 % liquid 4 Application  60 mL Topical Once Nathan Castaneda       sodium chloride  flush (NS) 0.9 % injection 3-10 mL  3-10 mL Intravenous Q12H Nathan Castaneda       sodium chloride  flush (NS) 0.9 % injection 3-10 mL  3-10 mL Intravenous PRN Nathan Castaneda        Allergies  Allergen Reactions   Vancomycin  Itching     REVIEW OF SYSTEMS:   [X]  denotes positive finding, [ ]  denotes negative finding Cardiac  Comments:  Chest pain or chest pressure:    Shortness of breath upon exertion:    Short of breath when lying flat:    Irregular heart rhythm:        Vascular    Pain in calf, thigh, or hip brought on by ambulation:    Pain in feet at night that wakes you up from your sleep:     Blood clot in your veins:    Leg swelling:         Pulmonary    Oxygen at home:    Productive cough:     Wheezing:         Neurologic    Sudden weakness in arms or legs:     Sudden numbness in arms or legs:     Sudden onset of difficulty speaking or slurred speech:    Temporary loss of vision in one eye:     Problems with dizziness:         Gastrointestinal    Blood in stool:     Vomited blood:         Genitourinary    Burning when urinating:     Blood in urine:        Psychiatric    Major depression:          Hematologic    Bleeding problems:    Problems with blood clotting too easily:        Skin    Rashes or ulcers:        Constitutional  Fever or chills:      PHYSICAL EXAMINATION:  Vitals:   07/06/23 0815 07/06/23 1035 07/06/23 1040 07/06/23 1045  BP: (!) 189/94 (!) 187/95 (!) 173/98 (!) 167/99  Pulse: (!) 57 (!) 55 (!) 53 (!) 54  Resp: 17 (!) 21 12 (!) 8  Temp: 97.7 F (36.5 C)     TempSrc: Oral     SpO2: 99% 97% 100% 98%  Weight: 74.8 kg     Height: 5' 10 (1.778 m)       General:  WDWN in NAD; vital signs documented above Gait: Not observed HENT: WNL, normocephalic Pulmonary: normal non-labored breathing , without Rales, rhonchi,  wheezing Cardiac: regular HR Abdomen: soft, NT, no masses Skin: without rashes Vascular Exam/Pulses:  Right Left  Radial 2+ (normal) 2+ (normal)                       Extremities: without ischemic changes, without Gangrene , without cellulitis; without open wounds;  Musculoskeletal: no muscle wasting or atrophy  Neurologic: A&O X 3;  No focal weakness or paresthesias are detected Psychiatric:  The pt has Normal affect.   Non-Invasive Vascular Imaging:   none    ASSESSMENT/PLAN:: 48 y.o. male presenting with suspected infected left sided hero graft.  I had a long conversation with him regarding the above, noted most notably that with the purulence coming from the 2 pustules immediately anterior to the graft, I was worried that it may involve the graft. He is aware that removal will also require venous patch plasty of the left brachial artery.  I will likely harvest from one of his legs.  At this time, I have no plans to exchange the right thigh TDC as it is functioning.  If this changes, I am happy to exchange it at the time of his surgery.  I am very concerned about personal hygiene and the risk of infection as he has had previous infections, and appears to be a picker.  During our office visit today, he would pop the  pustules and then wipe his hands on his pants, returning to the pustule to remove more of the drainage.  I have discussed the risk and benefits of left arm hero graft removal, brachial artery patch plasty, he elected to proceed.    Nathan FORBES Rim, Castaneda [facility] [phone]

## 2023-07-06 NOTE — Progress Notes (Signed)
 PHARMACY NOTE:  ANTIMICROBIAL RENAL DOSAGE ADJUSTMENT  Current antimicrobial regimen includes a mismatch between antimicrobial dosage and estimated renal function.  As per policy approved by the Pharmacy & Therapeutics and Medical Executive Committees, the antimicrobial dosage will be adjusted accordingly.  Current antimicrobial dosage:  Cefazolin  2g IV Q8H x 7 days   Indication: wound infection   Renal Function:  Estimated Creatinine Clearance: 7.8 mL/min (A) (by C-G formula based on SCr of 12.2 mg/dL (H)). [x]      On intermittent HD, scheduled: next HD planned for tomorrow, will need to follow HD schedule inpt  []      On CRRT    Antimicrobial dosage has been changed to:  Cefazolin  1g IV Q24H- Administer after HD on HD days   Additional comments:   Thank you for allowing pharmacy to be a part of this patient's care.  Massie Fila, PharmD Clinical Pharmacist  07/06/2023 6:43 PM

## 2023-07-06 NOTE — Op Note (Signed)
 NAME: Nathan Castaneda    MRN: [id number] DOB: 08-11-1975    DATE OF OPERATION: 07/06/2023  PREOP DIAGNOSIS:    Left arm infected hero graft, poorly functioning right femoral tunneled dialysis catheter  POSTOP DIAGNOSIS:    Same  PROCEDURE:    Tunneled dialysis catheter exchange in the right common femoral artery-55 cm palindrome Hero graft resection Right calf saphenectomy Redo exposure of the left brachial artery greater than 30 days Transection of the left brachial artery with primary repair end-to-end anastomosis at the antecubital fossa 1000 mg.  Morcel powder placement 15 French drain placement   SURGEON: Fonda FORBES Rim  ASSIST: Curry Damme, PA  ANESTHESIA: General  EBL: 250 mL  INDICATIONS:    Nathan Castaneda is a 48 y.o. male with end-stage renal disease who is exhausted all access options.  He is currently being dialyzed through a right sided femoral tunneled dialysis catheter.  The catheter has not been functioning well at dialysis, and his dialysis center asked for an exchange.  Furthermore, he has pustules on the left sided hero graft that has been in place for a number of years.  These continue to produce purulence.  After discussing the risks and benefits of left arm hero graft resection, right femoral tunneled dialysis catheter exchange, the patient elected to proceed.  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  General anesthesia was induced and patient prepped in a presenter fashion.  The case began with right common femoral artery tunneled dialysis catheter exchange.  A wire was brought onto the field and run through the 55 cm palindrome catheter.  Special care was taken to ensure that the  wire did not enter the heart.  Furthermore, the wire would not move further cephalad concerning for superior vena cava occlusion.  The old catheter was removed after the bumper was released from surrounding tissue and a new catheter inserted over the wire.  The catheter was positioned at the atriocaval junction.  Next, able to the left arm.  An ultrasound was used to insonate the entirety of the left sided hero graft.  The hero graft was marked.  I initially made an incision at the left clavicle and exposed to the metal transition point.  At this point, the central catheter was removed without issue.  Manual pressure was held to prevent backbleeding.  Next, significant effort was made to release the graft portion of the catheter from surrounding tissue.  Several skip incisions were made down the left arm pulling the graft through these incisions to the level of the antecubital fossa.  Next, the antecubital fossa.  The graft was taken down to the brachial artery.  The radial artery proved very friable.  The patient was heparinized, and the artery clamped.  The graft was then removed from the artery.    My plan was to patch the artery, therefore a small incision was made on the right calf and the saphenous vein harvested in standard fashion.  This was splayed, and as I began to sew in the artery in standard fashion using 6-0 Prolene suture, the friable artery ripped.  This led to an arterial defect of greater than 50% of the wall, therefore I elected to move to bypass versus end-to-end  anastomosis.  I was able to mobilize the brachial artery both proximally and distally.  The artery was transected, and the proximal and distal ends brought together under no tension.  I reanastomosed the artery using running 6-0 Prolene suture without issue.  Upon completion, the patient had an excellent, palpable pulse at the level of the wrist.  Ultrasound was used to insonate the artery demonstrating no significant stenosis.  I  was happy with the Doppler insonation both proximally and distally.  At this point, heparin  was reversed.  DDAVP was administered as well due to significant venous hypertension from central venous occlusion bilaterally.  I elected to use myriad morcel powder to improve healing as the patient has had healing issues in the past, and there was a redo exposure of the left brachial artery.  Wound beds were irrigated with copious amounts of saline.  A 15 French drain was threaded through the prior graft site and the wound beds were closed in layers using 2-0 Vicryl suture with staples at the level of the skin.  The arm was then wrapped with an Ace wrap for compression.  Palpable radial pulse at case completion.  Fonda FORBES Rim, MD [facility] DATE OF DICTATION:   07/06/2023

## 2023-07-07 DIAGNOSIS — Z9889 Other specified postprocedural states: Secondary | ICD-10-CM

## 2023-07-07 DIAGNOSIS — Z95828 Presence of other vascular implants and grafts: Secondary | ICD-10-CM

## 2023-07-07 MED ORDER — ANTICOAGULANT SODIUM CITRATE 4% (200MG/5ML) IV SOLN: 5.0000 mL | Status: DC | PRN

## 2023-07-07 MED ORDER — HEPARIN SODIUM (PORCINE) 1000 UNIT/ML IJ SOLN
INTRAMUSCULAR | Status: AC
  Filled 2023-07-07: qty 2

## 2023-07-07 MED ORDER — ALTEPLASE 2 MG IJ SOLR
INTRAMUSCULAR | Status: AC
  Filled 2023-07-07: qty 2

## 2023-07-07 MED ORDER — ASPIRIN 81 MG PO TBEC
81.0000 mg | DELAYED_RELEASE_TABLET | Freq: Every day | ORAL | Status: DC
  Administered 2023-07-08 – 2023-07-11 (×3): 81 mg via ORAL
  Filled 2023-07-07 (×3): qty 1

## 2023-07-07 MED ORDER — ALTEPLASE 2 MG IJ SOLR
2.0000 mg | Freq: Once | INTRAMUSCULAR | Status: AC | PRN
  Administered 2023-07-07: 6 mg

## 2023-07-07 MED ORDER — HEPARIN SODIUM (PORCINE) 1000 UNIT/ML DIALYSIS
2000.0000 [IU] | INTRAMUSCULAR | Status: DC | PRN
Start: 2023-07-08 — End: 2023-07-07

## 2023-07-07 MED ORDER — ALTEPLASE 2 MG IJ SOLR
INTRAMUSCULAR | Status: AC
  Filled 2023-07-07: qty 4

## 2023-07-07 MED ORDER — HEPARIN SODIUM (PORCINE) 1000 UNIT/ML DIALYSIS: 2000.0000 [IU] | Freq: Once | INTRAMUSCULAR | Status: DC

## 2023-07-07 MED ORDER — ALTEPLASE 2 MG IJ SOLR
INTRAMUSCULAR | Status: AC
  Filled 2023-07-07: qty 6

## 2023-07-07 MED ORDER — ALTEPLASE 2 MG IJ SOLR
6.0000 mg | Freq: Once | INTRAMUSCULAR | Status: AC
  Administered 2023-07-07: 6 mg

## 2023-07-07 NOTE — Progress Notes (Addendum)
  Progress Note    07/07/2023 8:21 AM 1 Day Post-Op  Subjective:  arm is sore but overall doing well. Sitting up eating breakfast   Vitals:   07/07/23 0224 07/07/23 0817  BP: (!) 155/88 (!) 142/83  Pulse: 69 74  Resp: 20 13  Temp: 98 F (36.7 C) 98 F (36.7 C)  SpO2: 100% 96%   Physical Exam: Cardiac:  regular Lungs:  non labored Incisions:  left arm dressed with ACE. JP drain with < 10 cc in bulb but was just drained Extremities:  left arm well perfused and warm with palpable radial pulse Neurologic: alert and oriented  CBC    Component Value Date/Time   WBC 3.7 (L) 02/22/2022 0430   RBC 3.24 (L) 02/22/2022 0430   HGB 11.2 (L) 07/06/2023 0835   HCT 33.0 (L) 07/06/2023 0835   PLT 109 (L) 02/22/2022 0430   MCV 80.2 02/22/2022 0430   MCH 25.3 (L) 02/22/2022 0430   MCHC 31.5 02/22/2022 0430   RDW 16.9 (H) 02/22/2022 0430   LYMPHSABS 1.3 02/21/2022 1702   MONOABS 0.3 02/21/2022 1702   EOSABS 0.1 02/21/2022 1702   BASOSABS 0.0 02/21/2022 1702    BMET    Component Value Date/Time   NA 132 (L) 07/06/2023 0835   K 3.9 07/06/2023 0835   CL 93 (L) 07/06/2023 0835   CO2 25 02/22/2022 0430   GLUCOSE 77 07/06/2023 0835   BUN 44 (H) 07/06/2023 0835   CREATININE 12.20 (H) 07/06/2023 0835   CALCIUM 8.3 (L) 02/22/2022 0430   GFRNONAA 4 (L) 02/22/2022 0430   GFRAA 3 (L) 09/13/2019 0429    INR No results found for: INR   Intake/Output Summary (Last 24 hours) at 07/07/2023 9178 Last data filed at 07/07/2023 0600 Gross per 24 hour  Intake 550 ml  Output 110 ml  Net 440 ml     Assessment/Plan:  48 y.o. male is s/p Tunneled dialysis catheter exchange in the right common femoral artery-55 cm palindrome Hero graft resection Right calf saphenectomy Redo exposure of the left brachial artery greater than 30 days Transection of the left brachial artery with primary repair end-to-end anastomosis at the antecubital fossa 1000 mg.  Morcel powder placement 15 French  drain placemen 1 Day Post-Op   Left arm well perfused, palpable left radial pulse. Keep ACE in place Drain with 110 CC recorded overnight. Will plan to keep this for another day or two until decreased output Appreciate Nephrology Assistance Plan is for inpatient HD today Has functioning right femoral TDC   Teretha Damme, PA-C [facility] [phone] 07/07/2023 8:21 AM  VASCULAR STAFF ADDENDUM: I have independently interviewed and examined the patient. I agree with the above.  Recovering well, >100cc of serosanguanous output from JP drain. Palpable radial pulse Will plan to keep ACE wrap in place today and take down tomorrow to assess incisions Continue JP drain Plan for HD today  Norman GORMAN Serve MD [facility] Phone Number: [phone] 07/07/2023 8:32 AM

## 2023-07-07 NOTE — Anesthesia Postprocedure Evaluation (Signed)
 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 (Left: Arm Upper) RIGHT GREATER SAPHENECTOMY (Right: Leg Lower) EXCHANGE OF RIGHT FEMORAL VEIN DIALYSIS CATHETER (Right: Groin)     Patient location during evaluation: PACU Anesthesia Type: Regional and General Level of consciousness: awake and alert Pain management: pain level controlled Vital Signs Assessment: post-procedure vital signs reviewed and stable Respiratory status: spontaneous breathing, nonlabored ventilation, respiratory function stable and patient connected to nasal cannula oxygen Cardiovascular status: blood pressure returned to baseline and stable Postop Assessment: no apparent nausea or vomiting Anesthetic complications: no   No notable events documented.  Last Vitals:  Vitals:   07/07/23 0224 07/07/23 0817  BP: (!) 155/88 (!) 142/83  Pulse: 69 74  Resp: 20 13  Temp: 36.7 C 36.7 C  SpO2: 100% 96%    Last Pain:  Vitals:   07/07/23 0823  TempSrc:   PainSc: 0-No pain                 Cordella SQUIBB Royce Stegman

## 2023-07-07 NOTE — Procedures (Signed)
 HD note  Patient was unable to receive his dialysis treatment as ordered for today. The HD tunneled catheter in his right thigh would not push or pull.   Cath flow was placed for 45 min without sufficient movement to draw labs.  It did appear that the cath flow was able to be removed. Per Dr. Geralynn order, cath flow was placed to dwell until next treatment. Patient made aware of all issues and interventions with no additional questions. Patient agreed to have labs drawn by phlebotomy. Report given to patient's floor nurse. Patient left without any new issues.

## 2023-07-08 MED ORDER — CHLORHEXIDINE GLUCONATE CLOTH 2 % EX PADS
6.0000 | MEDICATED_PAD | Freq: Every day | CUTANEOUS | Status: DC
  Administered 2023-07-09 – 2023-07-11 (×3): 6 via TOPICAL

## 2023-07-08 NOTE — Plan of Care (Signed)

## 2023-07-08 NOTE — Progress Notes (Signed)
 [facility]aneda Progress Note   Subjective:    Seen and examined patient at bedside. S/p L HERO AVG resection and TDC exchange 6/27 by Dr. Lanis. Informed new R femoral TDC wasn't working on HD yesterday; thus, dwelled with cath-flo. Plan to assess whether [facility] works today. If access continues not to work, will need to re-consult VVS. Discharge aborted until access is functioning properly.  Objective Vitals:   07/07/23 1952 07/07/23 2330 07/08/23 0321 07/08/23 0739  BP: (!) 159/98 (!) 162/98 (!) 144/98 (!) 144/93  Pulse: 68 66 71 78  Resp: 20 20 20 16   Temp: 97.9 F (36.6 C) 98 F (36.7 C) 98.1 F (36.7 C) 97.9 F (36.6 C)  TempSrc: Oral Oral Oral Oral  SpO2: 100% 100% 98% 98%  Weight:      Height:       Physical Exam General: Awake, alert, on RA, NAD Heart: S1 and S2; No murmurs, gallops, or rubs Lungs: Clear throughout Abdomen: Soft and non-tender Extremities: No LE edema Dialysis Access: R femoral TDC (malfunctioning); JP drain noted L arm   Filed Weights   07/06/23 0815 07/07/23 1506  Weight: 74.8 kg 75.7 kg    Intake/Output Summary (Last 24 hours) at 07/08/2023 1226 Last data filed at 07/08/2023 1100 Gross per 24 hour  Intake --  Output 20 ml  Net -20 ml    Additional Objective Labs: Basic Metabolic Panel: Recent Labs  Lab 07/06/23 0835  NA 132*  K 3.9  CL 93*  GLUCOSE 77  BUN 44*  CREATININE 12.20*   Liver Function Tests: No results for input(s): AST, ALT, ALKPHOS, BILITOT, PROT, ALBUMIN in the last 168 hours. No results for input(s): LIPASE, AMYLASE in the last 168 hours. CBC: Recent Labs  Lab 07/06/23 0835  HGB 11.2*  HCT 33.0*   Blood Culture    Component Value Date/Time   SDES TISSUE 07/06/2023 1245   SPECREQUEST LEFT ARM ANCEF  07/06/2023 1245   CULT  07/06/2023 1245    NO GROWTH < 24 HOURS Performed at [facility], [address], [place], [place] [address]    REPTSTATUS PENDING 07/06/2023 1245     Cardiac Enzymes: No results for input(s): CKTOTAL, CKMB, CKMBINDEX, TROPONINI in the last 168 hours. CBG: No results for input(s): GLUCAP in the last 168 hours. Iron  Studies: No results for input(s): IRON , TIBC, TRANSFERRIN, FERRITIN in the last 72 hours. No results found for: INR, PROTIME Studies/Results: No results found.  Medications:   ceFAZolin  (ANCEF ) IV 1 g (07/07/23 2243)    aspirin EC  81 mg Oral Daily   chlorhexidine   60 mL Topical Once   Chlorhexidine  Gluconate Cloth  6 each Topical Q0600   docusate sodium  100 mg Oral BID   ferric citrate   630 mg Oral TID WC   heparin   5,000 Units Subcutaneous Q8H   pantoprazole   40 mg Oral Daily   sodium chloride  flush  3 mL Intravenous Q12H   sodium zirconium cyclosilicate  10 g Oral Daily    Dialysis Orders: [facility] MWF 4h   B400   69.1kg   2K   TDC  Heparin  none Last OP HD 6/25, post wt 73kg  Assessment/Plan: HeRO graft resection and TDC R femoral exchange: done 6/27 by Dr. Lanis.  ESRD: on HD MWF. Missed HD 6/27. New R femoral TDC is not working; thus, dwelled with cath-flo overnight. Assessing access today whether it is working. If not, will need to re-consult VVS. Likely will  need to replace [facility] again tomorrow. Plan for HD 6/30 after access has been taken care of. 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  Dispo - Discharge aborted until his access is working properly  Charmaine Piety, NP [employer] 07/08/2023,12:26 PM  LOS: 2 days

## 2023-07-08 NOTE — Progress Notes (Addendum)
  Progress Note    07/08/2023 8:13 AM 2 Days Post-Op  Subjective:  appears comfortable, sleeping when I entered room     Vitals:   07/08/23 0321 07/08/23 0739  BP: (!) 144/98 (!) 144/93  Pulse: 71 78  Resp: 20 16  Temp: 98.1 F (36.7 C) 97.9 F (36.6 C)  SpO2: 98% 98%   Physical Exam: Cardiac:  regular Lungs:  non labored Incisions: left arm ACE removed, honey comb dressings in place, clean and dry. JP drain still in place. > 25 cc of blood in bulb Extremities:  left arm well perfused and warm. 2+ radial pulse. Right femoral TDC catheter site well appearing Neurologic: alert and oriented  CBC    Component Value Date/Time   WBC 3.7 (L) 02/22/2022 0430   RBC 3.24 (L) 02/22/2022 0430   HGB 11.2 (L) 07/06/2023 0835   HCT 33.0 (L) 07/06/2023 0835   PLT 109 (L) 02/22/2022 0430   MCV 80.2 02/22/2022 0430   MCH 25.3 (L) 02/22/2022 0430   MCHC 31.5 02/22/2022 0430   RDW 16.9 (H) 02/22/2022 0430   LYMPHSABS 1.3 02/21/2022 1702   MONOABS 0.3 02/21/2022 1702   EOSABS 0.1 02/21/2022 1702   BASOSABS 0.0 02/21/2022 1702    BMET    Component Value Date/Time   NA 132 (L) 07/06/2023 0835   K 3.9 07/06/2023 0835   CL 93 (L) 07/06/2023 0835   CO2 25 02/22/2022 0430   GLUCOSE 77 07/06/2023 0835   BUN 44 (H) 07/06/2023 0835   CREATININE 12.20 (H) 07/06/2023 0835   CALCIUM 8.3 (L) 02/22/2022 0430   GFRNONAA 4 (L) 02/22/2022 0430   GFRAA 3 (L) 09/13/2019 0429    INR No results found for: INR  No intake or output data in the 24 hours ending 07/08/23 0813   Assessment/Plan:  48 y.o. male is s/p  Tunneled dialysis catheter exchange in the right common femoral artery-55 cm palindrome Hero graft resection Right calf saphenectomy Redo exposure of the left brachial artery greater than 30 days Transection of the left brachial artery with primary repair end-to-end anastomosis at the antecubital fossa 1000 mg.  Morcel powder placement 15 French drain placemen 2 Day Post- op    Left arm well perfused and warm. JP still with a lot of output however I/O not recorded Plan will be to remove drain tomorrow Incisions are all well appearing. Honey Comb dressings are all c/d/i Appreciate Nephrology Assistance. Saw RN note regarding TDC not functioning so was unable to dialyze yesterday. Believe second attempt will be made today with cath flow left in catheter If it does not function will likely need replaced tomorrow in OR Continue Aspirin Possible discharge tomorrow pending function of TDC and successful HD session He will have follow up arranged in 2 weeks for staple removal    Teretha Damme, PA-C [facility] [phone] 07/08/2023 8:13 AM  VASCULAR STAFF ADDENDUM: I have independently interviewed and examined the patient. I agree with the above.   Norman GORMAN Serve MD [facility] Phone Number: [phone] 07/08/2023 8:39 AM

## 2023-07-09 ENCOUNTER — Encounter (HOSPITAL_COMMUNITY): Payer: Self-pay | Admitting: Vascular Surgery

## 2023-07-09 MED ORDER — HEPARIN SODIUM (PORCINE) 1000 UNIT/ML IJ SOLN
INTRAMUSCULAR | Status: AC
  Filled 2023-07-09: qty 1

## 2023-07-09 NOTE — Procedures (Signed)
 HD note  This writer withdrew the Cath Flow from each lumen of the HD catheter in the patient's right leg. The medication was able to be removed.  The flushing was tight.  The flow was not fast moving with the flushing both in and out. The catheter lumens were then locked with Heparin . Explained process and outcome to the patient.  He had no follow up questions.

## 2023-07-09 NOTE — Progress Notes (Signed)
 Patient's total dialysis catheter continues to malfunction.  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  Will need exchange tomorrow.  Will make n.p.o. midnight.

## 2023-07-09 NOTE — Progress Notes (Signed)
 Mobility Specialist Progress Note:   07/09/23 1055  Mobility  Activity Ambulated with assistance in hallway;Ambulated with assistance in room  Level of Assistance Contact guard assist, steadying assist  Assistive Device None  Distance Ambulated (ft) 300 ft  Activity Response Tolerated well  Mobility Referral Yes  Mobility visit 1 Mobility  Mobility Specialist Start Time (ACUTE ONLY) 1055  Mobility Specialist Stop Time (ACUTE ONLY) 1103  Mobility Specialist Time Calculation (min) (ACUTE ONLY) 8 min   Pt received in bed, agreeable to mobility session. Ambulated in hallway with CGA for safety, d/t unsteadiness from RLE incisions. Pt declined using RW. Tolerated session well, VSS throughout. Returned pt to room, left with all needs met.   Shemeika Starzyk Mobility Specialist Please contact via Special educational needs teacher or  Rehab office at [phone]

## 2023-07-09 NOTE — Progress Notes (Signed)
 Pt receives out-pt HD at [facility] on MWF 10:15 am chair time. Will assist as needed.   Lauraine Polite Renal Navigator [phone]

## 2023-07-09 NOTE — Progress Notes (Signed)
 Patient accidentally pulled out the JP drain in left arm (15 cc inside the bulb),no bleeding noted,honey comb dressing is still intact with old drainage the same size(marked) in initial assessment.Patient is alert and oriented V/S is stable.Will continue to closely monitor.

## 2023-07-09 NOTE — Plan of Care (Signed)
  Problem: Pain Managment: Goal: General experience of comfort will improve and/or be controlled Outcome: Progressing

## 2023-07-09 NOTE — Progress Notes (Signed)
 [facility] Progress Note   Subjective:    Unfortunately TDC still with sluggish flow.  Plan for replacement tomorrow with vascular surgery.  Patient with no complaints  Objective Vitals:   07/08/23 2350 07/09/23 0337 07/09/23 0743 07/09/23 1132  BP: (!) 135/97 (!) 147/94 (!) 155/92 (!) 165/96  Pulse: 77 74 85 67  Resp: 15 16 16    Temp: 98.4 F (36.9 C) 97.9 F (36.6 C) 97.8 F (36.6 C) 98.3 F (36.8 C)  TempSrc: Oral Oral Oral Oral  SpO2: 97% 96%  99%  Weight:      Height:       Physical Exam General: Lying in bed in no distress Heart: Normal rate, no rub Lungs: Bilateral chest rise with no increased work of breathing Abdomen: Soft and non-tender Extremities: No LE edema Dialysis Access: R femoral TDC (malfunctioning)  Filed Weights   07/06/23 0815 07/07/23 1506  Weight: 74.8 kg 75.7 kg    Intake/Output Summary (Last 24 hours) at 07/09/2023 1142 Last data filed at 07/09/2023 0245 Gross per 24 hour  Intake 240 ml  Output 30 ml  Net 210 ml    Additional Objective Labs: Basic Metabolic Panel: Recent Labs  Lab 07/06/23 0835  NA 132*  K 3.9  CL 93*  GLUCOSE 77  BUN 44*  CREATININE 12.20*   Liver Function Tests: No results for input(s): AST, ALT, ALKPHOS, BILITOT, PROT, ALBUMIN in the last 168 hours. No results for input(s): LIPASE, AMYLASE in the last 168 hours. CBC: Recent Labs  Lab 07/06/23 0835  HGB 11.2*  HCT 33.0*   Blood Culture    Component Value Date/Time   SDES TISSUE 07/06/2023 1245   SPECREQUEST LEFT ARM ANCEF  07/06/2023 1245   CULT  07/06/2023 1245    CULTURE REINCUBATED FOR BETTER GROWTH Performed at [facility], [address], [place], [place] [address]    REPTSTATUS PENDING 07/06/2023 1245    Cardiac Enzymes: No results for input(s): CKTOTAL, CKMB, CKMBINDEX, TROPONINI in the last 168 hours. CBG: No results for input(s): GLUCAP in the last 168 hours. Iron  Studies: No results  for input(s): IRON , TIBC, TRANSFERRIN, FERRITIN in the last 72 hours. No results found for: INR, PROTIME Studies/Results: No results found.  Medications:   ceFAZolin  (ANCEF ) IV 1 g (07/08/23 1812)    aspirin EC  81 mg Oral Daily   Chlorhexidine  Gluconate Cloth  6 each Topical Q0600   docusate sodium  100 mg Oral BID   ferric citrate   630 mg Oral TID WC   heparin   5,000 Units Subcutaneous Q8H   pantoprazole   40 mg Oral Daily   sodium chloride  flush  3 mL Intravenous Q12H   sodium zirconium cyclosilicate  10 g Oral Daily    Dialysis Orders: [facility] MWF 4h   B400   69.1kg   2K   TDC  Heparin  none Last OP HD 6/25, post wt 73kg  Assessment/Plan: HeRO graft resection and TDC R femoral exchange: done 6/27 by Dr. Lanis but still without function and plan for replacement on 7/1.  Appreciate help ESRD: on HD MWF. Missed HD 6/27. New R femoral TDC is not working.  Plan for replacement tomorrow and dialysis afterwards.   HTN: BP's stable Volume: Partially because of delayed dialysis.  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  Dispo - Discharge aborted until his access is working properly   [employer] 07/09/2023,11:42 AM  LOS: 3 days

## 2023-07-09 NOTE — Progress Notes (Addendum)
 Progress Note    07/09/2023 7:58 AM 3 Days Post-Op  Subjective:  Patient states that drain fell out while was using commode early this morning. Denies pulling catheter out of left arm. RN reports that drain was found on the ground   Vitals:   07/09/23 0337 07/09/23 0743  BP: (!) 147/94 (!) 155/92  Pulse: 74 85  Resp: 16 16  Temp: 97.9 F (36.6 C) 97.8 F (36.6 C)  SpO2: 96%    Physical Exam: Cardiac:  regular Lungs:  non labored Incisions:  left arm with proximal honey comb dressings saturated, remaining dressings c/d/I. Top honey comb dressing removed. Dry gauze dressing placed and ACE bandage Extremities:  left upper arm well perfused and warm with palpable radial pulse Neurologic: alert and oriented  CBC    Component Value Date/Time   WBC 3.7 (L) 02/22/2022 0430   RBC 3.24 (L) 02/22/2022 0430   HGB 11.2 (L) 07/06/2023 0835   HCT 33.0 (L) 07/06/2023 0835   PLT 109 (L) 02/22/2022 0430   MCV 80.2 02/22/2022 0430   MCH 25.3 (L) 02/22/2022 0430   MCHC 31.5 02/22/2022 0430   RDW 16.9 (H) 02/22/2022 0430   LYMPHSABS 1.3 02/21/2022 1702   MONOABS 0.3 02/21/2022 1702   EOSABS 0.1 02/21/2022 1702   BASOSABS 0.0 02/21/2022 1702    BMET    Component Value Date/Time   NA 132 (L) 07/06/2023 0835   K 3.9 07/06/2023 0835   CL 93 (L) 07/06/2023 0835   CO2 25 02/22/2022 0430   GLUCOSE 77 07/06/2023 0835   BUN 44 (H) 07/06/2023 0835   CREATININE 12.20 (H) 07/06/2023 0835   CALCIUM 8.3 (L) 02/22/2022 0430   GFRNONAA 4 (L) 02/22/2022 0430   GFRAA 3 (L) 09/13/2019 0429    INR No results found for: INR   Intake/Output Summary (Last 24 hours) at 07/09/2023 0758 Last data filed at 07/09/2023 0245 Gross per 24 hour  Intake 240 ml  Output 50 ml  Net 190 ml     Assessment/Plan:  48 y.o. male is s/p Tunneled dialysis catheter exchange in the right common femoral artery-55 cm palindrome Hero graft resection Right calf saphenectomy Redo exposure of the left  brachial artery greater than 30 days Transection of the left brachial artery with primary repair end-to-end anastomosis at the antecubital fossa 1000 mg.  Morcel powder placement 15 French drain placemen 3 Days Post-Op   Left arm remains well perfused and warm with palpable radial pulse Patient pulled drain out early this morning. Recorded was 15 cc output overnight. Proximal honey comb dressings saturated so these were removed. Slow ooze between staple line present. Dry dressings applied as well as ACE wrap on left arm Right femoral TDC repositioned with Dr. Lanis at bedside Have sent message to Dr. Macel regarding attempting to use catheter today to see if it will function. If not we will plan to replace [facility] with shorter one in OR tomorrow  Teretha Damme, [place] [facility] [phone] 07/09/2023 7:58 AM  VASCULAR STAFF ADDENDUM: I have independently interviewed and examined the patient. I agree with the above.  Drain somehow dislodged, and is now removed. Patient with occluded supra vena cava.  I think that the catheter was abutting this.  The catheter was pulled back 2 cm.  Bumper still remains in the subcutaneous tissue.  This was done under sterile fashion and a stitch is placed to hold the catheter in place. 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  Discussed the importance of washing his hands, not touching the wounds or his right sided femoral catheter.  Please attempt dialysis again today.  Should this fail, will need exchange with 28 cm catheter placement.  Fonda FORBES Rim MD [facility] Phone Number: [phone] 07/09/2023 8:24 AM

## 2023-07-10 ENCOUNTER — Inpatient Hospital Stay (HOSPITAL_COMMUNITY)
Admission: AD | Disposition: A | Discharge status: Home / Self Care | Payer: Self-pay | Source: Home / Self Care | Attending: Vascular Surgery

## 2023-07-10 DIAGNOSIS — I871 Compression of vein: Secondary | ICD-10-CM

## 2023-07-10 DIAGNOSIS — Z992 Dependence on renal dialysis: Secondary | ICD-10-CM

## 2023-07-10 DIAGNOSIS — N186 End stage renal disease: Secondary | ICD-10-CM

## 2023-07-10 HISTORY — PX: TUNNELLED CATHETER EXCHANGE: CATH118373

## 2023-07-10 LAB — CBC
HCT: 23.6 % — ABNORMAL LOW (ref 39.0–52.0)
Hemoglobin: 7.8 g/dL — ABNORMAL LOW (ref 13.0–17.0)
MCH: 25.1 pg — ABNORMAL LOW (ref 26.0–34.0)
MCHC: 33.1 g/dL (ref 30.0–36.0)
MCV: 75.9 fL — ABNORMAL LOW (ref 80.0–100.0)
Platelets: 141 10*3/uL — ABNORMAL LOW (ref 150–400)
RBC: 3.11 MIL/uL — ABNORMAL LOW (ref 4.22–5.81)
RDW: 17.3 % — ABNORMAL HIGH (ref 11.5–15.5)
WBC: 6 10*3/uL (ref 4.0–10.5)
nRBC: 0 % (ref 0.0–0.2)

## 2023-07-10 LAB — HEPATITIS B SURFACE ANTIGEN: Hepatitis B Surface Ag: NONREACTIVE

## 2023-07-10 LAB — BASIC METABOLIC PANEL WITH GFR
Anion gap: 20 — ABNORMAL HIGH (ref 5–15)
BUN: 70 mg/dL — ABNORMAL HIGH (ref 6–20)
CO2: 19 mmol/L — ABNORMAL LOW (ref 22–32)
Calcium: 8.6 mg/dL — ABNORMAL LOW (ref 8.9–10.3)
Chloride: 90 mmol/L — ABNORMAL LOW (ref 98–111)
Creatinine, Ser: 19.36 mg/dL — ABNORMAL HIGH (ref 0.61–1.24)
GFR, Estimated: 3 mL/min — ABNORMAL LOW (ref >60)
Glucose, Bld: 76 mg/dL (ref 70–99)
Potassium: 4.8 mmol/L (ref 3.5–5.1)
Sodium: 129 mmol/L — ABNORMAL LOW (ref 135–145)

## 2023-07-10 SURGERY — TUNNELLED CATHETER EXCHANGE

## 2023-07-10 MED ORDER — FENTANYL CITRATE (PF) 100 MCG/2ML IJ SOLN
INTRAMUSCULAR | Status: AC
  Filled 2023-07-10: qty 2

## 2023-07-10 MED ORDER — HEPARIN SODIUM (PORCINE) 1000 UNIT/ML IJ SOLN
INTRAMUSCULAR | Status: AC
  Filled 2023-07-10: qty 6

## 2023-07-10 MED ORDER — MIDAZOLAM HCL 2 MG/2ML IJ SOLN
INTRAMUSCULAR | Status: AC
Start: 2023-07-10 — End: 2023-07-10
  Filled 2023-07-10: qty 2

## 2023-07-10 MED ORDER — LIDOCAINE HCL (PF) 1 % IJ SOLN
INTRAMUSCULAR | Status: AC
  Filled 2023-07-10: qty 30

## 2023-07-10 MED ORDER — FENTANYL BOLUS VIA INFUSION
INTRAVENOUS | Status: DC | PRN
  Administered 2023-07-10 (×2): 50 ug via INTRAVENOUS

## 2023-07-10 MED ORDER — MIDAZOLAM HCL 2 MG/2ML IJ SOLN
INTRAMUSCULAR | Status: AC
  Filled 2023-07-10: qty 2

## 2023-07-10 MED ORDER — HEPARIN SODIUM (PORCINE) 1000 UNIT/ML IJ SOLN
INTRAMUSCULAR | Status: DC | PRN
Start: 2023-07-10 — End: 2023-07-10
  Administered 2023-07-10 (×2): 2600 [IU] via INTRAVENOUS

## 2023-07-10 MED ORDER — IODIXANOL 320 MG/ML IV SOLN
INTRAVENOUS | Status: DC | PRN
Start: 2023-07-10 — End: 2023-07-10
  Administered 2023-07-10: 20 mL

## 2023-07-10 MED ORDER — MIDAZOLAM HCL 2 MG/2ML IJ SOLN
INTRAMUSCULAR | Status: DC | PRN
  Administered 2023-07-10 (×2): 2 mg via INTRAVENOUS

## 2023-07-10 MED ORDER — LIDOCAINE HCL (PF) 1 % IJ SOLN
INTRAMUSCULAR | Status: DC | PRN
  Administered 2023-07-10: 10 mL

## 2023-07-10 SURGICAL SUPPLY — 3 items
DERMABOND ADVANCED .7 DNX12 (GAUZE/BANDAGES/DRESSINGS) IMPLANT
KIT CATH CHRNC PALINDROME 14.5 (CATHETERS) IMPLANT
WIRE AMPLATZ SS-J .035X180CM (WIRE) IMPLANT

## 2023-07-10 NOTE — H&P (View-Only) (Signed)
  Progress Note    07/10/2023 7:24 AM 4 Days Post-Op  Subjective:  no complaints. RN yesterday evening reported a lot of bleeding from left arm, TDC site and right lower leg after given Heparin  to get [facility] catheter to work. Pressure dressings applied and no further bleeding issues overnight   Vitals:   07/09/23 2317 07/10/23 0311  BP: (!) 154/94 (!) 168/98  Pulse: 92 92  Resp: 15 17  Temp: 97.9 F (36.6 C) 97.8 F (36.6 C)  SpO2: 96% 97%   Physical Exam: Cardiac:  regular Lungs:  non labored Incisions:  left arm dressings c/d/I. Right femoral TDC site with some strike through bleeding but stable, right leg dressings CDI Extremities:  LUE well perfused and warm with palpable radial pulse Neurologic: alert and oriented  CBC    Component Value Date/Time   WBC 3.7 (L) 02/22/2022 0430   RBC 3.24 (L) 02/22/2022 0430   HGB 11.2 (L) 07/06/2023 0835   HCT 33.0 (L) 07/06/2023 0835   PLT 109 (L) 02/22/2022 0430   MCV 80.2 02/22/2022 0430   MCH 25.3 (L) 02/22/2022 0430   MCHC 31.5 02/22/2022 0430   RDW 16.9 (H) 02/22/2022 0430   LYMPHSABS 1.3 02/21/2022 1702   MONOABS 0.3 02/21/2022 1702   EOSABS 0.1 02/21/2022 1702   BASOSABS 0.0 02/21/2022 1702    BMET    Component Value Date/Time   NA 132 (L) 07/06/2023 0835   K 3.9 07/06/2023 0835   CL 93 (L) 07/06/2023 0835   CO2 25 02/22/2022 0430   GLUCOSE 77 07/06/2023 0835   BUN 44 (H) 07/06/2023 0835   CREATININE 12.20 (H) 07/06/2023 0835   CALCIUM 8.3 (L) 02/22/2022 0430   GFRNONAA 4 (L) 02/22/2022 0430   GFRAA 3 (L) 09/13/2019 0429    INR No results found for: INR   Intake/Output Summary (Last 24 hours) at 07/10/2023 0724 Last data filed at 07/09/2023 1750 Gross per 24 hour  Intake 150 ml  Output --  Net 150 ml     Assessment/Plan:  48 y.o. male is s/p Tunneled dialysis catheter exchange in the right common femoral artery-55 cm palindrome Hero graft resection Right calf saphenectomy Redo exposure of the left  brachial artery greater than 30 days Transection of the left brachial artery with primary repair end-to-end anastomosis at the antecubital fossa 1000 mg.  Morcel powder placement 15 French drain placemen 4 Days Post-Op   Some bleeding yesterday from incisions and R fem TDC site after Heparin  given to try to get [facility] catheter to work All bleeding stable now. Compression dressings left in place. Can be removed later today Plan is for right femoral TDC exchange in Cath lab today with Dr. Serene Will reach out to Nephrology about inpatient HD later this afternoon vs possibly d/c later today with resumption of his normal outpatient HD schedule tomorrow 7/2   Teretha Damme, [place] [facility] [phone] 07/10/2023 7:24 AM

## 2023-07-10 NOTE — Progress Notes (Signed)
  Progress Note    07/10/2023 7:24 AM 4 Days Post-Op  Subjective:  no complaints. RN yesterday evening reported a lot of bleeding from left arm, TDC site and right lower leg after given Heparin  to get [facility] catheter to work. Pressure dressings applied and no further bleeding issues overnight   Vitals:   07/09/23 2317 07/10/23 0311  BP: (!) 154/94 (!) 168/98  Pulse: 92 92  Resp: 15 17  Temp: 97.9 F (36.6 C) 97.8 F (36.6 C)  SpO2: 96% 97%   Physical Exam: Cardiac:  regular Lungs:  non labored Incisions:  left arm dressings c/d/I. Right femoral TDC site with some strike through bleeding but stable, right leg dressings CDI Extremities:  LUE well perfused and warm with palpable radial pulse Neurologic: alert and oriented  CBC    Component Value Date/Time   WBC 3.7 (L) 02/22/2022 0430   RBC 3.24 (L) 02/22/2022 0430   HGB 11.2 (L) 07/06/2023 0835   HCT 33.0 (L) 07/06/2023 0835   PLT 109 (L) 02/22/2022 0430   MCV 80.2 02/22/2022 0430   MCH 25.3 (L) 02/22/2022 0430   MCHC 31.5 02/22/2022 0430   RDW 16.9 (H) 02/22/2022 0430   LYMPHSABS 1.3 02/21/2022 1702   MONOABS 0.3 02/21/2022 1702   EOSABS 0.1 02/21/2022 1702   BASOSABS 0.0 02/21/2022 1702    BMET    Component Value Date/Time   NA 132 (L) 07/06/2023 0835   K 3.9 07/06/2023 0835   CL 93 (L) 07/06/2023 0835   CO2 25 02/22/2022 0430   GLUCOSE 77 07/06/2023 0835   BUN 44 (H) 07/06/2023 0835   CREATININE 12.20 (H) 07/06/2023 0835   CALCIUM 8.3 (L) 02/22/2022 0430   GFRNONAA 4 (L) 02/22/2022 0430   GFRAA 3 (L) 09/13/2019 0429    INR No results found for: INR   Intake/Output Summary (Last 24 hours) at 07/10/2023 0724 Last data filed at 07/09/2023 1750 Gross per 24 hour  Intake 150 ml  Output --  Net 150 ml     Assessment/Plan:  48 y.o. male is s/p Tunneled dialysis catheter exchange in the right common femoral artery-55 cm palindrome Hero graft resection Right calf saphenectomy Redo exposure of the left  brachial artery greater than 30 days Transection of the left brachial artery with primary repair end-to-end anastomosis at the antecubital fossa 1000 mg.  Morcel powder placement 15 French drain placemen 4 Days Post-Op   Some bleeding yesterday from incisions and R fem TDC site after Heparin  given to try to get [facility] catheter to work All bleeding stable now. Compression dressings left in place. Can be removed later today Plan is for right femoral TDC exchange in Cath lab today with Dr. Serene Will reach out to Nephrology about inpatient HD later this afternoon vs possibly d/c later today with resumption of his normal outpatient HD schedule tomorrow 7/2   Teretha Damme, [place] [facility] [phone] 07/10/2023 7:24 AM

## 2023-07-10 NOTE — Op Note (Signed)
    Patient name: Ryden Wainer Francom MRN: [id number] DOB: 03-Dec-1975 Sex: male  07/10/2023 Pre-operative Diagnosis: ESRD Post-operative diagnosis:  Same Surgeon:  Malvina New Procedure Performed:  1.  Exchange of right femoral tunneled dialysis catheter  2.  Inferior venacavogram  3.  Conscious sedation, 21 minutes     Indications: This is a 48 year old gentleman who is having issues with his right femoral catheter that was recently exchanged.  There was concern that the catheter was abutting a central venous occlusion and so he comes in today for new catheter.  Procedure:  The patient was identified in the holding area and taken to room 8.  The patient was then placed supine on the table and prepped and draped in the usual sterile fashion.  A time out was called.  Conscious sedation was administered with the use of IV fentanyl  and Versed  under continuous physician and nurse monitoring.  Heart rate, blood pressure, and oxygen saturation were continuously monitored.  Total sedation time was 21 minutes.  1% lidocaine  was used for local anesthesia.  The existing catheter had its cuff easily mobilized.  I then withdrew the heparin  from the existing catheter and performed venography which showed contrast draining into the heart without evidence of stenosis.  I then placed a Amplatz wire through the catheter and the existing catheter was removed.  I then replaced this with a shorter 44 cm catheter.  The cuff was situated at the skin exit site.  Both ports flushed and aspirated without difficulty.  The catheter was sutured to the skin with 3-0 nylon.  Dermabond and sterile dressings were applied   Impression:  #1  Successful exchange of right femoral catheter placing a new 44 cm catheter  #2:  Right femoral tunneled catheter is ready for immediate use  V. Malvina New, M.D., [facility] [place] Office: [phone] Pager:  [phone]

## 2023-07-10 NOTE — Progress Notes (Signed)
 Mobility Specialist Progress Note:    07/10/23 1115  Mobility  Activity Dangled on edge of bed  Level of Assistance Standby assist, set-up cues, supervision of patient - no hands on  Assistive Device None  Activity Response Tolerated well  Mobility Referral Yes  Mobility visit 1 Mobility  Mobility Specialist Start Time (ACUTE ONLY) 1111  Mobility Specialist Stop Time (ACUTE ONLY) 1115  Mobility Specialist Time Calculation (min) (ACUTE ONLY) 4 min   Pt received in bed, lying diagonally in bed. Offered to assist in hallway ambulation, or transfer to chair. Pt declined but agreeable to sit EOB. Pt c/o LUE pain. Left with all needs met. Call bell in reach.  Nicholi Ghuman Mobility Specialist Please contact via Special educational needs teacher or  Rehab office at [phone]

## 2023-07-10 NOTE — Progress Notes (Signed)
  Progress Note    07/10/2023 12:15 PM 4 Days Post-Op  Subjective:  no complaints. RN yesterday evening reported a lot of bleeding from left arm, TDC site and right lower leg after given Heparin  to get [facility] catheter to work. Pressure dressings applied and no further bleeding issues overnight   Vitals:   07/10/23 0738 07/10/23 1139  BP: (!) 163/91 (!) 174/101  Pulse: 95 85  Resp: 15 17  Temp: 98.3 F (36.8 C) 98 F (36.7 C)  SpO2: 97%    Physical Exam: Cardiac:  regular Lungs:  non labored Incisions:  left arm dressings c/d/I. Right femoral TDC site with some strike through bleeding but stable, right leg dressings CDI Extremities:  LUE well perfused and warm with palpable radial pulse Neurologic: alert and oriented  CBC    Component Value Date/Time   WBC 6.0 07/10/2023 0726   RBC 3.11 (L) 07/10/2023 0726   HGB 7.8 (L) 07/10/2023 0726   HCT 23.6 (L) 07/10/2023 0726   PLT 141 (L) 07/10/2023 0726   MCV 75.9 (L) 07/10/2023 0726   MCH 25.1 (L) 07/10/2023 0726   MCHC 33.1 07/10/2023 0726   RDW 17.3 (H) 07/10/2023 0726   LYMPHSABS 1.3 02/21/2022 1702   MONOABS 0.3 02/21/2022 1702   EOSABS 0.1 02/21/2022 1702   BASOSABS 0.0 02/21/2022 1702    BMET    Component Value Date/Time   NA 129 (L) 07/10/2023 0726   K 4.8 07/10/2023 0726   CL 90 (L) 07/10/2023 0726   CO2 19 (L) 07/10/2023 0726   GLUCOSE 76 07/10/2023 0726   BUN 70 (H) 07/10/2023 0726   CREATININE 19.36 (H) 07/10/2023 0726   CALCIUM 8.6 (L) 07/10/2023 0726   GFRNONAA 3 (L) 07/10/2023 0726   GFRAA 3 (L) 09/13/2019 0429    INR No results found for: INR   Intake/Output Summary (Last 24 hours) at 07/10/2023 1215 Last data filed at 07/09/2023 1750 Gross per 24 hour  Intake 150 ml  Output --  Net 150 ml     Assessment/Plan:  48 y.o. male is s/p Tunneled dialysis catheter exchange in the right common femoral artery-55 cm palindrome Hero graft resection Right calf saphenectomy Redo exposure of the left  brachial artery greater than 30 days Transection of the left brachial artery with primary repair end-to-end anastomosis at the antecubital fossa 1000 mg.  Morcel powder placement 15 French drain placemen 4 Days Post-Op   Some bleeding yesterday from incisions and R fem TDC site after Heparin  given to try to get [facility] catheter to work All bleeding stable now. Compression dressings left in place. Can be removed later today Plan is for right femoral TDC exchange in Cath lab today with Dr. Serene Will reach out to Nephrology about inpatient HD later this afternoon   Fonda FORBES Rim, PA-C [facility] [phone] 07/10/2023 12:15 PM  VASCULAR STAFF ADDENDUM: I have independently interviewed and examined the patient. I agree with the above.  Plan for tunneled dialysis catheter replacement today with Dr. Serene.  Likely will need shorter catheter.  After discussing risks and benefits, the patient elected to proceed. Palpable pulse at the wrist.  Arm remains wrapped.  Will takedown tomorrow prior to discharge.  Fonda FORBES Rim MD [facility] Phone Number: [phone] 07/10/2023 12:15 PM

## 2023-07-10 NOTE — Progress Notes (Signed)
 [facility] Progress Note   Subjective:    Patient feels well today. Getting TDC today. Discussed HD plan after. He wants to DC and get HD outpatient. We will see how he feels after procedure but best to have some HD here before leaving.  Objective Vitals:   07/09/23 1959 07/09/23 2317 07/10/23 0311 07/10/23 0738  BP: (!) 180/101 (!) 154/94 (!) 168/98 (!) 163/91  Pulse: 82 92 92 95  Resp: 18 15 17 15   Temp: 97.8 F (36.6 C) 97.9 F (36.6 C) 97.8 F (36.6 C) 98.3 F (36.8 C)  TempSrc: Oral Oral Oral Oral  SpO2: 94% 96% 97% 97%  Weight:      Height:       Physical Exam General: Lying in bed in no distress Heart: Normal rate, no rub Lungs: Bilateral chest rise with no increased work of breathing Abdomen: Soft and non-tender Extremities: No LE edema Dialysis Access: R femoral TDC (malfunctioning)  Filed Weights   07/06/23 0815 07/07/23 1506  Weight: 74.8 kg 75.7 kg    Intake/Output Summary (Last 24 hours) at 07/10/2023 1055 Last data filed at 07/09/2023 1750 Gross per 24 hour  Intake 150 ml  Output --  Net 150 ml    Additional Objective Labs: Basic Metabolic Panel: Recent Labs  Lab 07/06/23 0835 07/10/23 0726  NA 132* 129*  K 3.9 4.8  CL 93* 90*  CO2  --  19*  GLUCOSE 77 76  BUN 44* 70*  CREATININE 12.20* 19.36*  CALCIUM  --  8.6*   Liver Function Tests: No results for input(s): AST, ALT, ALKPHOS, BILITOT, PROT, ALBUMIN in the last 168 hours. No results for input(s): LIPASE, AMYLASE in the last 168 hours. CBC: Recent Labs  Lab 07/06/23 0835 07/10/23 0726  WBC  --  6.0  HGB 11.2* 7.8*  HCT 33.0* 23.6*  MCV  --  75.9*  PLT  --  141*   Blood Culture    Component Value Date/Time   SDES TISSUE 07/06/2023 1245   SPECREQUEST LEFT ARM ANCEF  07/06/2023 1245   CULT  07/06/2023 1245    RARE STAPHYLOCOCCUS EPIDERMIDIS STERNUM NO ANAEROBES ISOLATED; CULTURE IN PROGRESS FOR 5 DAYS    REPTSTATUS PENDING 07/06/2023 1245     Cardiac Enzymes: No results for input(s): CKTOTAL, CKMB, CKMBINDEX, TROPONINI in the last 168 hours. CBG: No results for input(s): GLUCAP in the last 168 hours. Iron  Studies: No results for input(s): IRON , TIBC, TRANSFERRIN, FERRITIN in the last 72 hours. No results found for: INR, PROTIME Studies/Results: No results found.  Medications:   ceFAZolin  (ANCEF ) IV 1 g (07/09/23 1740)    aspirin EC  81 mg Oral Daily   Chlorhexidine  Gluconate Cloth  6 each Topical Q0600   docusate sodium  100 mg Oral BID   ferric citrate   630 mg Oral TID WC   heparin   5,000 Units Subcutaneous Q8H   pantoprazole   40 mg Oral Daily   sodium chloride  flush  3 mL Intravenous Q12H   sodium zirconium cyclosilicate  10 g Oral Daily    Dialysis Orders: [facility] MWF 4h   B400   69.1kg   2K   TDC  Heparin  none Last OP HD 6/25, post wt 73kg  Assessment/Plan: HeRO graft resection and TDC R femoral exchange: done 6/27 by Dr. Lanis but still without function and plan for replacement on 7/1.  Appreciate help ESRD: on HD MWF. Missed HD 6/27. New R femoral TDC is not working.  Plan for  replacement today and dialysis afterward. However, if patient strongly desires DC and is doing well post procedure he is likely okay to go HTN: BP's stable Volume: Partially because of delayed dialysis.  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 Some dilutional component. CTM   [facility] 07/10/2023,10:55 AM  LOS: 4 days

## 2023-07-10 NOTE — Interval H&P Note (Signed)
 History and Physical Interval Note:  07/10/2023 12:47 PM  Nathan Castaneda  has presented today for surgery, with the diagnosis of instage renal.  The various methods of treatment have been discussed with the patient and family. After consideration of risks, benefits and other options for treatment, the patient has consented to  Procedure(s): TUNNELLED CATHETER EXCHANGE (N/A) as a surgical intervention.  The patient's history has been reviewed, patient examined, no change in status, stable for surgery.  I have reviewed the patient's chart and labs.  Questions were answered to the patient's satisfaction.     Malvina New

## 2023-07-10 NOTE — Progress Notes (Signed)
   07/10/23 1930  Vitals  Temp 97.8 F (36.6 C)  Pulse Rate 85  Resp (!) 22  BP 116/81  SpO2 98 %  O2 Device Room Air  Weight (S)  75 kg (Weight Bed)  Type of Weight Post-Dialysis  Post Treatment  Dialyzer Clearance Clear  Hemodialysis Intake (mL) 0 mL  Liters Processed 69.5  Fluid Removed (mL) 3200 mL  Tolerated HD Treatment Yes  Post-Hemodialysis Comments Pt. voice in the last 40 mins that he wanted to come off the machine, pt sign AMA. Report given to 4E bedside RN   Received patient in bed to unit.  Alert and oriented.  Informed consent signed and in chart.   TX duration: 2:54  Patient tolerated well.  Transported back to the room  Alert, without acute distress.  Hand-off given to patient's nurse.   Access used: Yes Access issues: Yes, HD catheter was bleeding and had to reinforced with gauze and apply a bag of NS to site to stop the bleeding. Bleeding stop after the treatment was done.  Total UF removed: 3200 Medication(s) given: See MAR Post HD VS: See Above Grid Post HD weight: 75 kg   Zebedee DELENA [facility]

## 2023-07-11 ENCOUNTER — Other Ambulatory Visit (HOSPITAL_COMMUNITY): Payer: Self-pay

## 2023-07-11 ENCOUNTER — Encounter (HOSPITAL_COMMUNITY): Payer: Self-pay | Admitting: Surgery

## 2023-07-11 LAB — AEROBIC/ANAEROBIC CULTURE W GRAM STAIN (SURGICAL/DEEP WOUND)
Culture: NO GROWTH
Gram Stain: NONE SEEN

## 2023-07-11 MED ORDER — ASPIRIN 81 MG PO TBEC
81.0000 mg | DELAYED_RELEASE_TABLET | Freq: Every day | ORAL | 12 refills | Status: DC
  Filled 2023-07-11: qty 30, 30d supply, fill #0

## 2023-07-11 MED ORDER — OXYCODONE-ACETAMINOPHEN 5-325 MG PO TABS
1.0000 | ORAL_TABLET | Freq: Four times a day (QID) | ORAL | 0 refills | Status: DC | PRN
  Filled 2023-07-11: qty 24, 6d supply, fill #0

## 2023-07-11 MED FILL — Fentanyl Citrate Preservative Free (PF) Inj 100 MCG/2ML: INTRAMUSCULAR | Qty: 2 | Status: AC

## 2023-07-11 NOTE — Progress Notes (Signed)
   07/11/23 0956  TOC Brief Assessment  Insurance and Status Reviewed  Patient has primary care physician Yes  Home environment has been reviewed home  Prior level of function: self/independent  Prior/Current Home Services No current home services  Social Drivers of Health Review SDOH reviewed no interventions necessary  Readmission risk has been reviewed Yes  Transition of care needs no transition of care needs at this time    Pt stable for transition home today, noted pt will need ride assistance. D/C lounge to assist with ride needs.  No HH or DME needs noted.

## 2023-07-11 NOTE — Discharge Planning (Signed)
 [facility] Patient Discharge Orders - [facility] CLINIC: [place]  Patient's name: Nathan Castaneda Admit/DC Dates: 07/06/2023 - 07/11/2023  DISCHARGE DIAGNOSES: AVG infection s/p HeRO AVG removal with  R calf saphenous vein patch, R femoral TDC placement, then exchange. Edema/volume overload  HD ORDER CHANGES: Heparin  change: no EDW Change: no  [place] Change: no  ANEMIA MANAGEMENT: Aranesp : Given: no   ESA dose for discharge: mircera 225 mcg IV q 2 weeks - same schedule IV Iron  dose at discharge: per protocol Transfusion: Given: no  BONE/MINERAL MEDICATIONS: Hectorol /Calcitriol  change: no Sensipar /Parsabiv change: no  ACCESS INTERVENTION/CHANGE: yes Details: Old HeRO AVG removed and R femoral TDC exchange on 6/27,  then another [facility] exchange on 7/1   RECENT LABS: Recent Labs  Lab 07/10/23 0726  HGB 7.8*  NA 129*  K 4.8  CALCIUM 8.6*    IV ANTIBIOTICS: no Details:  OTHER ANTICOAGULATION: On Coumadin?: no  OTHER/APPTS/LAB ORDERS: - Has follow ups with [facility] on 7/10 for wound check and 7/17 for staple removal  D/C Meds to be reconciled by nurse after every discharge.  Completed By: Izetta Boehringer, PA-C [facility] Pager [phone]   Reviewed by: MD:______ RN_______

## 2023-07-11 NOTE — Discharge Instructions (Signed)
   [facility]  Discharge Instructions  AV Fistula or Graft Surgery for Dialysis Access  Please refer to the following instructions for your post-procedure care. Your surgeon or physician assistant will discuss any changes with you.  Activity  You may drive the day following your surgery, if you are comfortable and no longer taking prescription pain medication. Resume full activity as the soreness in your incision resolves.  Bathing/Showering  You may shower after you go home. Keep your incision dry for 48 hours. Do not soak in a bathtub, hot tub, or swim until the incision heals completely. You may not shower if you have a hemodialysis catheter.  Incision Care  Clean your incision with mild soap and water after 48 hours. Pat the area dry with a clean towel. You do not need a bandage unless otherwise instructed. Do not apply any ointments or creams to your incision. You may have skin glue on your incision. Do not peel it off. It will come off on its own in about one week. Your arm may swell a bit after surgery. To reduce swelling use pillows to elevate your arm so it is above your heart. Your doctor will tell you if you need to lightly wrap your arm with an ACE bandage.  Diet  Resume your normal diet. There are not special food restrictions following this procedure. In order to heal from your surgery, it is CRITICAL to get adequate nutrition. Your body requires vitamins, minerals, and protein. Vegetables are the best source of vitamins and minerals. Vegetables also provide the perfect balance of protein. Processed food has little nutritional value, so try to avoid this.  Medications  Resume taking all of your medications. If your incision is causing pain, you may take over-the counter pain relievers such as acetaminophen  (Tylenol ). If you were prescribed a stronger pain medication, please be aware these medications can cause nausea and constipation. Prevent  nausea by taking the medication with a snack or meal. Avoid constipation by drinking plenty of fluids and eating foods with high amount of fiber, such as fruits, vegetables, and grains.  Do not take Tylenol  if you are taking prescription pain medications.  Follow up Your surgeon may want to see you in the office following your access surgery. If so, this will be arranged at the time of your surgery.  Please call us  immediately for any of the following conditions:  Increased pain, redness, drainage (pus) from your incision site Fever of 101 degrees or higher Severe or worsening pain at your incision site Hand pain or numbness.  Reduce your risk of vascular disease:  Stop smoking. If you would like help, call [facility] at 1-800-QUIT-NOW (947-489-8718) or Sarah [place] at [phone]  Manage your cholesterol Maintain a desired weight Control your diabetes Keep your blood pressure down  Dialysis  It will take several weeks to several months for your new dialysis access to be ready for use. Your surgeon will determine when it is okay to use it. Your nephrologist will continue to direct your dialysis. You can continue to use your Permcath until your new access is ready for use.   07/11/2023 Nathan Castaneda [id number] [birth year]  Surgeon(s): Serene Gaile ORN, MD  Procedure(s): TUNNELLED CATHETER EXCHANGE  Only use right femoral [facility]  If you have any questions, please call the office at [phone].

## 2023-07-11 NOTE — Progress Notes (Signed)
 [facility] Progress Note   Subjective:    Patient feels well today with no complaints.  Got new dialysis catheter and tolerated dialysis last night with no issues.  Denies shortness of breath or chest pain  Objective Vitals:   07/10/23 2312 07/11/23 0310 07/11/23 0528 07/11/23 0818  BP: 124/81 123/82  138/83  Pulse: 100 100  99  Resp: 17 14 16 19   Temp: 98.6 F (37 C) 99.3 F (37.4 C)  98.3 F (36.8 C)  TempSrc: Oral Oral  Oral  SpO2: 98% 95%  95%  Weight:   73.8 kg   Height:       Physical Exam General: Lying in bed in no distress Heart: Normal rate, no rub Lungs: Bilateral chest rise with no increased work of breathing Abdomen: Soft and non-tender Extremities: No LE edema Dialysis Access: R femoral TDC (malfunctioning)  Filed Weights   07/10/23 1547 07/10/23 1930 07/11/23 0528  Weight: (S) 77.3 kg (S) 75 kg 73.8 kg    Intake/Output Summary (Last 24 hours) at 07/11/2023 1252 Last data filed at 07/11/2023 0818 Gross per 24 hour  Intake 240 ml  Output 3200 ml  Net -2960 ml    Additional Objective Labs: Basic Metabolic Panel: Recent Labs  Lab 07/06/23 0835 07/10/23 0726  NA 132* 129*  K 3.9 4.8  CL 93* 90*  CO2  --  19*  GLUCOSE 77 76  BUN 44* 70*  CREATININE 12.20* 19.36*  CALCIUM  --  8.6*   Liver Function Tests: No results for input(s): AST, ALT, ALKPHOS, BILITOT, PROT, ALBUMIN in the last 168 hours. No results for input(s): LIPASE, AMYLASE in the last 168 hours. CBC: Recent Labs  Lab 07/06/23 0835 07/10/23 0726  WBC  --  6.0  HGB 11.2* 7.8*  HCT 33.0* 23.6*  MCV  --  75.9*  PLT  --  141*   Blood Culture    Component Value Date/Time   SDES TISSUE 07/06/2023 1245   SPECREQUEST LEFT ARM ANCEF  07/06/2023 1245   CULT  07/06/2023 1245    RARE STAPHYLOCOCCUS EPIDERMIDIS NO ANAEROBES ISOLATED; CULTURE IN PROGRESS FOR 5 DAYS    REPTSTATUS PENDING 07/06/2023 1245    Cardiac Enzymes: No results for input(s):  CKTOTAL, CKMB, CKMBINDEX, TROPONINI in the last 168 hours. CBG: No results for input(s): GLUCAP in the last 168 hours. Iron  Studies: No results for input(s): IRON , TIBC, TRANSFERRIN, FERRITIN in the last 72 hours. No results found for: INR, PROTIME Studies/Results: PERIPHERAL VASCULAR CATHETERIZATION Result Date: 07/10/2023 Images from the original result were not included. Patient name: Nathan Castaneda MRN: [id number] DOB: 11-17-1975 Sex: male 07/10/2023 Pre-operative Diagnosis: ESRD Post-operative diagnosis:  Same Surgeon:  Malvina New Procedure Performed:  1.  Exchange of right femoral tunneled dialysis catheter  2.  Inferior venacavogram  3.  Conscious sedation, 21 minutes  Indications: This is a 48 year old gentleman who is having issues with his right femoral catheter that was recently exchanged.  There was concern that the catheter was abutting a central venous occlusion and so he comes in today for new catheter. Procedure:  The patient was identified in the holding area and taken to room 8.  The patient was then placed supine on the table and prepped and draped in the usual sterile fashion.  A time out was called.  Conscious sedation was administered with the use of IV fentanyl  and Versed  under continuous physician and nurse monitoring.  Heart rate, blood pressure, and oxygen saturation were continuously monitored.  Total sedation time was 21 minutes.  1% lidocaine  was used for local anesthesia.  The existing catheter had its cuff easily mobilized.  I then withdrew the heparin  from the existing catheter and performed venography which showed contrast draining into the heart without evidence of stenosis.  I then placed a Amplatz wire through the catheter and the existing catheter was removed.  I then replaced this with a shorter 44 cm catheter.  The cuff was situated at the skin exit site.  Both ports flushed and aspirated without difficulty.  The catheter was sutured to the skin with  3-0 nylon.  Dermabond and sterile dressings were applied Impression:  #1  Successful exchange of right femoral catheter placing a new 44 cm catheter  #2:  Right femoral tunneled catheter is ready for immediate use V. Malvina New, M.D., FACS [facility] [place] Office: [phone] Pager:  [phone]    Medications:   ceFAZolin  (ANCEF ) IV 1 g (07/10/23 2123)    aspirin EC  81 mg Oral Daily   Chlorhexidine  Gluconate Cloth  6 each Topical Q0600   docusate sodium  100 mg Oral BID   ferric citrate   630 mg Oral TID WC   heparin   5,000 Units Subcutaneous Q8H   pantoprazole   40 mg Oral Daily   sodium chloride  flush  3 mL Intravenous Q12H   sodium zirconium cyclosilicate  10 g Oral Daily    Dialysis Orders: [facility] MWF 4h   B400   69.1kg   2K   TDC  Heparin  none Last OP HD 6/25, post wt 73kg  Assessment/Plan: HeRO graft resection and TDC R femoral exchange: done 6/27 by Dr. Lanis but still without function and replaced on 7/1 and tolerated dialysis with no issues. ESRD: on HD MWF. Missed HD 6/27. New R femoral TDC is not working.  Replaced on 7/1 and working well.  Had dialysis on the night of 7/1.  Okay to wait for further dialysis until Friday HTN: BP's stable Volume: Partially because of delayed dialysis.  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 Some dilutional component. CTM   [facility] 07/11/2023,12:52 PM  LOS: 5 days

## 2023-07-11 NOTE — Plan of Care (Signed)
 Problem: Education: Goal: Knowledge of General Education information will improve Description: Including pain rating scale, medication(s)/side effects and non-pharmacologic comfort measures 07/11/2023 1148 by Gary Michaelis, RN Outcome: Adequate for Discharge 07/11/2023 1147 by Gary Michaelis, RN Outcome: Progressing   Problem: Health Behavior/Discharge Planning: Goal: Ability to manage health-related needs will improve 07/11/2023 1148 by Gary Michaelis, RN Outcome: Adequate for Discharge 07/11/2023 1147 by Gary Michaelis, RN Outcome: Progressing   Problem: Clinical Measurements: Goal: Ability to maintain clinical measurements within normal limits will improve 07/11/2023 1148 by Gary Michaelis, RN Outcome: Adequate for Discharge 07/11/2023 1147 by Gary Michaelis, RN Outcome: Progressing Goal: Will remain free from infection 07/11/2023 1148 by Gary Michaelis, RN Outcome: Adequate for Discharge 07/11/2023 1147 by Gary Michaelis, RN Outcome: Progressing Goal: Diagnostic test results will improve 07/11/2023 1148 by Gary Michaelis, RN Outcome: Adequate for Discharge 07/11/2023 1147 by Gary Michaelis, RN Outcome: Progressing Goal: Cardiovascular complication will be avoided 07/11/2023 1148 by Gary Michaelis, RN Outcome: Adequate for Discharge 07/11/2023 1147 by Gary Michaelis, RN Outcome: Progressing   Problem: Activity: Goal: Risk for activity intolerance will decrease 07/11/2023 1148 by Gary Michaelis, RN Outcome: Adequate for Discharge 07/11/2023 1147 by Gary Michaelis, RN Outcome: Progressing   Problem: Nutrition: Goal: Adequate nutrition will be maintained 07/11/2023 1148 by Gary Michaelis, RN Outcome: Adequate for Discharge 07/11/2023 1147 by Gary Michaelis, RN Outcome: Progressing   Problem: Coping: Goal: Level of anxiety will decrease 07/11/2023 1148 by Gary Michaelis, RN Outcome: Adequate for Discharge 07/11/2023 1147 by Gary Michaelis,  RN Outcome: Progressing   Problem: Elimination: Goal: Will not experience complications related to bowel motility 07/11/2023 1148 by Gary Michaelis, RN Outcome: Adequate for Discharge 07/11/2023 1147 by Gary Michaelis, RN Outcome: Progressing Goal: Will not experience complications related to urinary retention 07/11/2023 1148 by Gary Michaelis, RN Outcome: Adequate for Discharge 07/11/2023 1147 by Gary Michaelis, RN Outcome: Progressing   Problem: Pain Managment: Goal: General experience of comfort will improve and/or be controlled 07/11/2023 1148 by Gary Michaelis, RN Outcome: Adequate for Discharge 07/11/2023 1147 by Gary Michaelis, RN Outcome: Progressing   Problem: Safety: Goal: Ability to remain free from injury will improve 07/11/2023 1148 by Gary Michaelis, RN Outcome: Adequate for Discharge 07/11/2023 1147 by Gary Michaelis, RN Outcome: Progressing   Problem: Skin Integrity: Goal: Risk for impaired skin integrity will decrease 07/11/2023 1148 by Gary Michaelis, RN Outcome: Adequate for Discharge 07/11/2023 1147 by Gary Michaelis, RN Outcome: Progressing   Problem: Education: Goal: Knowledge of General Education information will improve Description: Including pain rating scale, medication(s)/side effects and non-pharmacologic comfort measures 07/11/2023 1148 by Gary Michaelis, RN Outcome: Adequate for Discharge 07/11/2023 1147 by Gary Michaelis, RN Outcome: Progressing   Problem: Health Behavior/Discharge Planning: Goal: Ability to manage health-related needs will improve 07/11/2023 1148 by Gary Michaelis, RN Outcome: Adequate for Discharge 07/11/2023 1147 by Gary Michaelis, RN Outcome: Progressing   Problem: Clinical Measurements: Goal: Ability to maintain clinical measurements within normal limits will improve 07/11/2023 1148 by Gary Michaelis, RN Outcome: Adequate for Discharge 07/11/2023 1147 by Gary Michaelis, RN Outcome:  Progressing Goal: Will remain free from infection 07/11/2023 1148 by Gary Michaelis, RN Outcome: Adequate for Discharge 07/11/2023 1147 by Gary Michaelis, RN Outcome: Progressing Goal: Diagnostic test results will improve 07/11/2023 1148 by Gary Michaelis, RN Outcome: Adequate for Discharge 07/11/2023 1147 by Gary Michaelis, RN Outcome: Progressing Goal: Cardiovascular complication will be avoided 07/11/2023 1148 by Gary Michaelis, RN Outcome: Adequate for Discharge 07/11/2023 1147 by Gary Michaelis, RN Outcome: Progressing   Problem: Activity:  Goal: Risk for activity intolerance will decrease 07/11/2023 1148 by Gary Michaelis, RN Outcome: Adequate for Discharge 07/11/2023 1147 by Gary Michaelis, RN Outcome: Progressing   Problem: Nutrition: Goal: Adequate nutrition will be maintained 07/11/2023 1148 by Gary Michaelis, RN Outcome: Adequate for Discharge 07/11/2023 1147 by Gary Michaelis, RN Outcome: Progressing   Problem: Coping: Goal: Level of anxiety will decrease 07/11/2023 1148 by Gary Michaelis, RN Outcome: Adequate for Discharge 07/11/2023 1147 by Gary Michaelis, RN Outcome: Progressing   Problem: Elimination: Goal: Will not experience complications related to bowel motility 07/11/2023 1148 by Gary Michaelis, RN Outcome: Adequate for Discharge 07/11/2023 1147 by Gary Michaelis, RN Outcome: Progressing Goal: Will not experience complications related to urinary retention 07/11/2023 1148 by Gary Michaelis, RN Outcome: Adequate for Discharge 07/11/2023 1147 by Gary Michaelis, RN Outcome: Progressing   Problem: Pain Managment: Goal: General experience of comfort will improve and/or be controlled 07/11/2023 1148 by Gary Michaelis, RN Outcome: Adequate for Discharge 07/11/2023 1147 by Gary Michaelis, RN Outcome: Progressing   Problem: Safety: Goal: Ability to remain free from injury will improve 07/11/2023 1148 by Gary Michaelis,  RN Outcome: Adequate for Discharge 07/11/2023 1147 by Gary Michaelis, RN Outcome: Progressing   Problem: Skin Integrity: Goal: Risk for impaired skin integrity will decrease 07/11/2023 1148 by Gary Michaelis, RN Outcome: Adequate for Discharge 07/11/2023 1147 by Gary Michaelis, RN Outcome: Progressing

## 2023-07-11 NOTE — Progress Notes (Signed)
 D/C noted. Case discussed with nephrologist. Pt should resume HD on Friday due to pt was d/c today (pt had a HD treatment yesterday per notes). Contacted [facility] to be advised of pt's d/c today and that pt should resume on Friday.   Randine Mungo Renal Navigator [phone]

## 2023-07-11 NOTE — Progress Notes (Addendum)
  Progress Note    07/11/2023 7:43 AM 1 Day Post-Op  Subjective:  left arm remains sore, eager to leave   Vitals:   07/11/23 0310 07/11/23 0528  BP: 123/82   Pulse: 100   Resp: 14 16  Temp: 99.3 F (37.4 C)   SpO2: 95%    Physical Exam: Cardiac:  regular Lungs:  non labored Incisions:  left arm incisions with staples intact. Mild oozing . Dry dressings reapplied and ACE bandage Extremities: left arm well perfused and warm with radial pulse 2+ Neurologic: alert and oriented  CBC    Component Value Date/Time   WBC 6.0 07/10/2023 0726   RBC 3.11 (L) 07/10/2023 0726   HGB 7.8 (L) 07/10/2023 0726   HCT 23.6 (L) 07/10/2023 0726   PLT 141 (L) 07/10/2023 0726   MCV 75.9 (L) 07/10/2023 0726   MCH 25.1 (L) 07/10/2023 0726   MCHC 33.1 07/10/2023 0726   RDW 17.3 (H) 07/10/2023 0726   LYMPHSABS 1.3 02/21/2022 1702   MONOABS 0.3 02/21/2022 1702   EOSABS 0.1 02/21/2022 1702   BASOSABS 0.0 02/21/2022 1702    BMET    Component Value Date/Time   NA 129 (L) 07/10/2023 0726   K 4.8 07/10/2023 0726   CL 90 (L) 07/10/2023 0726   CO2 19 (L) 07/10/2023 0726   GLUCOSE 76 07/10/2023 0726   BUN 70 (H) 07/10/2023 0726   CREATININE 19.36 (H) 07/10/2023 0726   CALCIUM 8.6 (L) 07/10/2023 0726   GFRNONAA 3 (L) 07/10/2023 0726   GFRAA 3 (L) 09/13/2019 0429    INR No results found for: INR   Intake/Output Summary (Last 24 hours) at 07/11/2023 0743 Last data filed at 07/10/2023 1930 Gross per 24 hour  Intake --  Output 3200 ml  Net -3200 ml     Assessment/Plan:  48 y.o. male is s/p  Tunneled dialysis catheter exchange in the right common femoral artery-55 cm palindrome Hero graft resection; Right calf saphenectomy; Redo exposure of the left brachial artery greater than 30 days; Transection of the left brachial artery with primary repair end-to-end anastomosis at the antecubital fossa 6/27 and [facility] exchange 7/1  Left arm dressings all removed this morning. Very minimal drainage.  Dry dressings reapplied and ACE Instructed patient on washing incisions daily with mild soap and water and applying dry gauze and ACE as needed to help keep incisions clean and reduce swelling LUE well perfused and warm with palpable radial pulse Right leg saphenectomy site c/d/I. Left open to air Right femoral TDC functioned yesterday for HD He will resume his outpatient HD on Friday per his regular schedule He is stable for discharge today. He will need ride home. Says he usually uses [facility]. Will discuss with TOC Follow up will be arranged in 2 weeks for staple removal/ incision check   Teretha Damme, PA-C [facility] [phone] 07/11/2023 7:43 AM  VASCULAR STAFF ADDENDUM: I have independently interviewed and examined the patient. I agree with the above.  Patient high risk for wound breakdown due to hygiene concerns.  He is a Orthoptist. Discussed the importance of keeping the wound beds dry, wrapped, and washing his hands prior to any manipulation of wound dressings. 2+ palpable pulse at level of the wrist.  Fonda FORBES Rim MD [facility] Phone Number: [phone] 07/11/2023 8:29 AM

## 2023-07-11 NOTE — Plan of Care (Signed)

## 2023-07-11 NOTE — Discharge Summary (Signed)
 Discharge Summary    Nathan Castaneda 02-11-1975 48 y.o. male  [id number]  Admission Date: 07/06/2023  Discharge Date: 07/11/2023  Physician: Dr. Cheryle Rim  Admission Diagnosis: ESRD (end stage renal disease) (HCC) [N18.6] Infection of vascular bypass graft, initial encounter (HCC) [T82.7XXA]   Indication: This is a 48 y.o. male with end-stage renal disease who is exhausted all access options. He is currently being dialyzed through a right sided femoral tunneled dialysis catheter. The catheter has not been functioning well at dialysis, and his dialysis center asked for an exchange. Furthermore, he has pustules on the left sided hero graft that has been in place for a number of years. These continue to produce purulence. After discussing the risks and benefits of left arm hero graft resection, right femoral tunneled dialysis catheter exchange, the patient elected to proceed.   Hospital Course:  The patient was admitted to the hospital and taken to the operating room on 07/06/2023 and underwent:Tunneled dialysis catheter exchange in the right common femoral artery-55 cm palindrome; Hero graft resection; Right calf saphenectomy; Redo exposure of the left brachial artery greater than 30 days;Transection of the left brachial artery with primary repair end-to-end anastomosis at the antecubital fossa;1000 mg.  Morcel powder placement and 15 French drain placement by Dr. Rim.   The pt tolerated the procedure well and was transported to the PACU in good condition. Nephrology was consulted post operatively for inpatient hemodialysis needs. Labs overall stable so Hemodialysis ordered for POD#1  6/28: left arm sore but overall doing well. Dressings all intact and well appearing. Left arm well perfused and warm with palpable radial pulse. JP with 110 cc overnight so was kept in place. Aspirin started. Right femoral TDC that was exchanged unfortunately would not work upon arrival to HD unit. Unable  to push or pull. Cath flow placed in catheter  6/29:no overnight events. Left arm ACE removed. Honey comb dressings intact, clean and dry. > 25 cc of bloody output in JP. RN overnight did not record total I&O. Decided to keep drain one more day. Left arm sore but well perfused and warm with palpable radial pulse. TDC was attempted a second time to be used after the cath flow was placed and it did not work. Plan made to exchange catheter in OR on 6/30  6/30: Jp was pulled out by patient overnight. Some increased oozing from incisions. Dressings replaced.  Right femoral TDC was reposition by Dr. Rim at bedside. Patient with occluded supra vena cava.likely catheter abutting this.  The catheter was pulled back 2 cm.  Bumper still remains in the subcutaneous tissue.  This was done under sterile fashion and a stitch is placed to hold the catheter in place. Nephrology notified of catheter adjustment. Again cath flow was administered and catheter was some flushing occurring but overall right femoral TDC would not function for HD. This was heparin  locked. Tolerated working with mobility specialist. Lot of left upper arm pain. Patient latera noted to have bleeding from left upper arm incisions, right leg saphenectomy site as well as TDC after heparin  was administered during HD. Dry dressings placed with compression. Plan made for exchange to shorter 28 cm catheter on 7/1 in OR. Patient made NPO after midnight.   7/1: no further bleeding issues overnight. Hemostasis improved with washout of heparin  as well as compression dressings. Due to [facility] not functioning he was taken to the OR by Dr. Serene for exchange of his catheter to a 28 cm femoral TDC. He tolerated  procedure well. Catheter was able to be successfully used for HD after procedure. Due to late dialysis session it was recommended he stay overnight.   7/2: he remained stable for discharge home.  Pain well controlled. Incisions all evaluated and clean, dry  dressings applied. TOC arranged ride home for patient. He will resume his outpatient hemodialysis schedule of MWF. His next dialysis session will be on 07/13/23. His access center was contact and notified of his return. PDMP was reviewed and post operative pain medication was brought to patients room through the [facility] prior to his discharge. He will follow up in  our office in 1 week for incision check   CBC    Component Value Date/Time   WBC 6.0 07/10/2023 0726   RBC 3.11 (L) 07/10/2023 0726   HGB 7.8 (L) 07/10/2023 0726   HCT 23.6 (L) 07/10/2023 0726   PLT 141 (L) 07/10/2023 0726   MCV 75.9 (L) 07/10/2023 0726   MCH 25.1 (L) 07/10/2023 0726   MCHC 33.1 07/10/2023 0726   RDW 17.3 (H) 07/10/2023 0726   LYMPHSABS 1.3 02/21/2022 1702   MONOABS 0.3 02/21/2022 1702   EOSABS 0.1 02/21/2022 1702   BASOSABS 0.0 02/21/2022 1702    BMET    Component Value Date/Time   NA 129 (L) 07/10/2023 0726   K 4.8 07/10/2023 0726   CL 90 (L) 07/10/2023 0726   CO2 19 (L) 07/10/2023 0726   GLUCOSE 76 07/10/2023 0726   BUN 70 (H) 07/10/2023 0726   CREATININE 19.36 (H) 07/10/2023 0726   CALCIUM 8.6 (L) 07/10/2023 0726   GFRNONAA 3 (L) 07/10/2023 0726   GFRAA 3 (L) 09/13/2019 0429      Discharge Instructions     Call MD for:  redness, tenderness, or signs of infection (pain, swelling, bleeding, redness, odor or green/yellow discharge around incision site)   Complete by: As directed    Call MD for:  severe or increased pain, loss or decreased feeling  in affected limb(s)   Complete by: As directed    Call MD for:  temperature >100.5   Complete by: As directed    Discharge wound care:   Complete by: As directed    Wash incisions daily with mild soap and water, pat dry. If there is any oozing you can cover with dry gauze and ACE wrap to help keep incisions clean. ACE will help with swelling in the arm as well. DO not soak in bathtub. Try not to pick or remove staples   Increase  activity slowly   Complete by: As directed    Walk with assistance use walker or cane as needed   Lifting restrictions   Complete by: As directed    No lifting for 6 weeks   Resume previous diet   Complete by: As directed    may wash over wound with mild soap and water   Complete by: As directed        Discharge Diagnosis:  ESRD (end stage renal disease) (HCC) [N18.6] Infection of vascular bypass graft, initial encounter (HCC) [U17.7XXA]  Secondary Diagnosis: Patient Active Problem List   Diagnosis Date Noted   Infection of vascular bypass graft, initial encounter (HCC) 07/06/2023   Hypokalemia 02/21/2022   Coag negative Staphylococcus bacteremia 02/21/2022   Bacteremia 02/21/2022   SBO (small bowel obstruction) (HCC) 03/31/2021   Elevated lipase 03/31/2021   GERD without esophagitis 03/31/2021   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    Class: Chronic   Bilateral hydronephrosis 03/18/2020   Left renal mass 03/18/2020   Lower urinary tract symptoms (LUTS) 03/18/2020   Crohn's disease of colon without complication (HCC) 01/07/2020   Peritoneal dialysis catheter in place [facility]) 01/07/2020   Pre-transplant evaluation for kidney transplant 01/07/2020   SLE (systemic lupus erythematosus) (HCC) 01/07/2020   Tobacco abuse 01/07/2020   Hypertension    Anemia of chronic disease    Hyponatremia    False aneurysm of artery (HCC)    Acute on chronic anemia 09/02/2019   ESRD on dialysis (HCC) 09/02/2019   Hypertensive urgency 09/02/2019   Nausea & vomiting 09/02/2019   Epistaxis 09/02/2019   Headache 04/15/2018   CHF (congestive heart failure) (HCC) 01/18/2018   Past Medical History:  Diagnosis Date   Anemia    Anxiety    History of recent blood transfusion    Hypertension    Lupus    Renal disease      Allergies as of 07/11/2023       Reactions   Firvanq  [vancomycin ]  Itching        Medication List     TAKE these medications    acetaminophen  500 MG tablet Commonly known as: TYLENOL  Take 1,000 mg by mouth 2 (two) times daily as needed for moderate pain (pain score 4-6), fever or headache.   aspirin EC 81 MG tablet Take 1 tablet (81 mg total) by mouth daily. Swallow whole.   ferric citrate  1 GM 210 MG(Fe) tablet Commonly known as: AURYXIA  Take 630 mg by mouth in the morning, at noon, and at bedtime.   Lokelma 10 g Pack packet Generic drug: sodium zirconium cyclosilicate Take 10 g by mouth See admin instructions. Take 1 packet (10g) by mouth once daily on non-dialysis days only. (Sunday, Tuesday, Thursday, Saturday).   oxyCODONE -acetaminophen  5-325 MG tablet Commonly known as: Percocet Take 1 tablet by mouth every 6 (six) hours as needed for severe pain (pain score 7-10).               Discharge Care Instructions  (From admission, onward)           Start     Ordered   07/11/23 0000  Discharge wound care:       Comments: Wash incisions daily with mild soap and water, pat dry. If there is any oozing you can cover with dry gauze and ACE wrap to help keep incisions clean. ACE will help with swelling in the arm as well. DO not soak in bathtub. Try not to pick or remove staples   07/11/23 0757             Instructions: [facility] Discharge Instructions AV Fistula or Graft Surgery for Dialysis Access  Please refer to the following instructions for your post-procedure care. Your surgeon or physician assistant will discuss any changes with you.  Activity  You may drive the day following your surgery, if you are comfortable and no longer taking prescription pain medication. Resume full activity as the soreness in your incision resolves.  Bathing/Showering  You may shower after you go home. Keep your incision dry for 48 hours. Do not soak in a bathtub, hot tub, or swim until the incision heals  completely. You may not shower if you have a hemodialysis catheter.  Incision Care  Clean your incision with mild soap and water after 48 hours. Pat the area  dry with a clean towel. You do not need a bandage unless otherwise instructed. Do not apply any ointments or creams to your incision. You may have skin glue on your incision. Do not peel it off. It will come off on its own in about one week. Your arm may swell a bit after surgery. To reduce swelling use pillows to elevate your arm so it is above your heart. Your doctor will tell you if you need to lightly wrap your arm with an ACE bandage.  Diet  Resume your normal diet. There are not special food restrictions following this procedure. In order to heal from your surgery, it is CRITICAL to get adequate nutrition. Your body requires vitamins, minerals, and protein. Vegetables are the best source of vitamins and minerals. Vegetables also provide the perfect balance of protein. Processed food has little nutritional value, so try to avoid this.  Medications  Resume taking all of your medications. If your incision is causing pain, you may take over-the counter pain relievers such as acetaminophen  (Tylenol ). If you were prescribed a stronger pain medication, please be aware these medications can cause nausea and constipation. Prevent nausea by taking the medication with a snack or meal. Avoid constipation by drinking plenty of fluids and eating foods with high amount of fiber, such as fruits, vegetables, and grains. Do not take Tylenol  if you are taking prescription pain medications.  Follow up Your surgeon may want to see you in the office following your access surgery. If so, this will be arranged at the time of your surgery.  Please call us  immediately for any of the following conditions:  Increased pain, redness, drainage (pus) from your incision site Fever of 101 degrees or higher Severe or worsening pain at your incision site Hand pain or  numbness.  Reduce your risk of vascular disease:  Stop smoking. If you would like help, call [facility] at 1-800-QUIT-NOW ((682) 222-6611) or [place] at 2628721325  Manage your cholesterol Maintain a desired weight Control your diabetes Keep your blood pressure down  Dialysis  It will take several weeks to several months for your new dialysis access to be ready for use. Your surgeon will determine when it is OK to use it. Your nephrologist will continue to direct your dialysis. You can continue to use your Permcath until your new access is ready for use.   07/11/2023 Nathan Castaneda [id number] 08-23-1975  Surgeon(s): Serene Gaile ORN, MD  Procedure(s): TUNNELLED CATHETER EXCHANGE   If you have any questions, please call the office at [phone].  Prescriptions given: Percocet 5-325 mg #24 No Refill Aspirin 81 mg #30 11 refills  Disposition: Home  Patient's condition: is Fair  Follow up: 1. [facility] in 1 week for incision check   Lucie Apt, PA-C [facility] [phone] 07/11/2023  12:23 PM

## 2023-07-12 ENCOUNTER — Telehealth (HOSPITAL_COMMUNITY): Payer: Self-pay | Admitting: Nephrology

## 2023-07-12 NOTE — Telephone Encounter (Signed)
 Transition of care contact from inpatient facility  Date of Discharge: 07/11/2023 Date of Contact: 07/12/2023 - attempt #1 Method of contact: Phone  Attempted to contact patient to discuss transition of care from inpatient admission. Patient did not answer the phone. Voicemail picked up but there was no ability to leave a message.  Izetta Boehringer, PA-C [employer] Pager [phone]

## 2023-07-18 NOTE — Progress Notes (Unsigned)
  POST OPERATIVE OFFICE NOTE    CC:  F/u for surgery  HPI:  This is a 48 y.o. male who is s/p hero graft resection, right calf saphenectomy, transection of the left brachial artery with primary repair end to end anastomosis at the antecubital fossa, morcel powder placement and TDC exchange right femoral vein on 07/06/2023 by Dr. Lanis.   He required exchange of his [facility] on 07/10/2023 by Dr. Serene.  Pt states he does not have pain/numbness in the left hand.  He does have some bloody ooze from some of the incisions on his arm.   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  The pt is on dialysis M/W/F at [facility] location.   Allergies  Allergen Reactions   Firvanq  [Vancomycin ] Itching    Current Outpatient Medications  Medication Sig Dispense Refill   acetaminophen  (TYLENOL ) 500 MG tablet Take 1,000 mg by mouth 2 (two) times daily as needed for moderate pain (pain score 4-6), fever or headache.     aspirin  EC 81 MG tablet Take 1 tablet (81 mg total) by mouth daily. Swallow whole. 30 tablet 12   ferric citrate  (AURYXIA ) 1 GM 210 MG(Fe) tablet Take 630 mg by mouth in the morning, at noon, and at bedtime.     LOKELMA  10 g PACK packet Take 10 g by mouth See admin instructions. Take 1 packet (10g) by mouth once daily on non-dialysis days only. (Sunday, Tuesday, Thursday, Saturday).     oxyCODONE -acetaminophen  (PERCOCET) 5-325 MG tablet Take 1 tablet by mouth every 6 (six) hours as needed for severe pain (pain score 7-10). 24 tablet 0   No current facility-administered medications for this visit.     ROS:  See HPI  Physical Exam:  Today's Vitals   07/19/23 1052  BP: 114/74  Pulse: 90  Temp: 98 F (36.7 C)  TempSrc: Temporal  SpO2: 93%  Weight: 167 lb 9.6 oz (76 kg)  Height: 5' 10 (1.778 m)  PainSc: 0-No pain   Body mass index is 24.05 kg/m.   Incisions:  as pictured; right leg saphectomy site healing nicely.   Extremities:   There  is a palpable left radial pulse.   Motor and sensory are in tact.       Assessment/Plan:  This is a 48 y.o. male who is s/p:  hero graft resection, right calf saphenectomy, transection of the left brachial artery with primary repair end to end anastomosis at the antecubital fossa, morcel powder placement and TDC exchange right femoral vein on 07/06/2023 by Dr. Lanis.  He required exchange of his [facility] on 07/10/2023 by Dr. Serene.   -the pt comes in today for incision check.  He does have some mild bloody ooze from the upper incisions.  Pt seen with Dr. Lanis.  Continue staples today.  Pt has central venous occlusion.  Continue to cleanse incisions with soap and water daily.  I did put a compression ace wrap on the left arm from hand to shoulder.  Continue this daily .  -he will be catheter dependent as he does not have any further options for access.   -he has f/u appt next Thursday-may be able to get every other staple out if appropriate.  Will re-evaluate at that time.    Lucie Apt, [facility] [phone]  Clinic MD:  Lanis

## 2023-07-19 ENCOUNTER — Ambulatory Visit: Attending: Vascular Surgery | Admitting: Physician Assistant

## 2023-07-19 VITALS — BP 114/74 | HR 90 | Temp 98.0°F | Ht 70.0 in | Wt 167.6 lb

## 2023-07-19 DIAGNOSIS — N186 End stage renal disease: Secondary | ICD-10-CM

## 2023-07-26 ENCOUNTER — Ambulatory Visit: Attending: Vascular Surgery | Admitting: Physician Assistant

## 2023-07-26 VITALS — BP 127/86 | HR 80 | Temp 97.8°F | Wt 167.3 lb

## 2023-07-26 DIAGNOSIS — N186 End stage renal disease: Secondary | ICD-10-CM

## 2023-07-26 NOTE — Progress Notes (Signed)
  POST OPERATIVE OFFICE NOTE    CC:  F/u for surgery  HPI:  This is a 48 y.o. male who is s/p hero graft resection, right calf saphenectomy, transection of the left brachial artery with repair end to end anastomosis at the antecubital fossa.  This was performed on 07/06/2023 by Dr. Silver.  He required a TDC exchange on 07/10/2023 in the right groin by Dr. Serene.  Dialysis is going well from right groin TDC.  He believes his incisions are healed in his arm.  He denies pain in his left hand.  Allergies  Allergen Reactions   Firvanq  [Vancomycin ] Itching    Current Outpatient Medications  Medication Sig Dispense Refill   acetaminophen  (TYLENOL ) 500 MG tablet Take 1,000 mg by mouth 2 (two) times daily as needed for moderate pain (pain score 4-6), fever or headache.     aspirin  EC 81 MG tablet Take 1 tablet (81 mg total) by mouth daily. Swallow whole. 30 tablet 12   ferric citrate  (AURYXIA ) 1 GM 210 MG(Fe) tablet Take 630 mg by mouth in the morning, at noon, and at bedtime.     LOKELMA  10 g PACK packet Take 10 g by mouth See admin instructions. Take 1 packet (10g) by mouth once daily on non-dialysis days only. (Sunday, Tuesday, Thursday, Saturday).     oxyCODONE -acetaminophen  (PERCOCET) 5-325 MG tablet Take 1 tablet by mouth every 6 (six) hours as needed for severe pain (pain score 7-10). 24 tablet 0   No current facility-administered medications for this visit.     ROS:  See HPI  Physical Exam:  Vitals:   07/26/23 1034  BP: 127/86  Pulse: 80  Temp: 97.8 F (36.6 C)  TempSrc: Oral  Weight: 167 lb 4.8 oz (75.9 kg)    Incision:  L arm incisions healed Extremities:  palpable L radial pulse Neuro: A&O  Assessment/Plan:  This is a 48 y.o. male who is s/p: Hero graft resection, right calf saphenectomy, transection of left brachial artery with primary repair end to end anastomosis  Left hand well-perfused with a palpable radial pulse.  Right leg saphenectomy and all incisions of the  left arm have healed.  All staples were removed and Steri-Strips were applied today.  We discussed removing every other staple today however the patient was strongly against having to come back to our office from [place] in the next 1 to 2 weeks.  Right groin TDC is working well.  He will be catheter dependent as he has no further options for permanent access.  He can follow-up on an as-needed basis.   Donnice Sender, PA-C [facility] [phone]  Clinic MD:  Lanis

## 2023-08-16 ENCOUNTER — Encounter: Payer: Self-pay | Admitting: Internal Medicine

## 2023-08-27 ENCOUNTER — Other Ambulatory Visit: Payer: Self-pay

## 2023-08-27 ENCOUNTER — Encounter (HOSPITAL_COMMUNITY): Payer: Self-pay

## 2023-08-27 ENCOUNTER — Observation Stay (HOSPITAL_COMMUNITY)
Admission: EM | Admit: 2023-08-27 | Discharge: 2023-08-28 | Disposition: A | Discharge status: Home / Self Care | Source: Other Acute Inpatient Hospital | Attending: Internal Medicine | Admitting: Internal Medicine

## 2023-08-27 ENCOUNTER — Encounter (HOSPITAL_COMMUNITY): Payer: Self-pay | Admitting: Family Medicine

## 2023-08-27 DIAGNOSIS — Z7982 Long term (current) use of aspirin: Secondary | ICD-10-CM | POA: Insufficient documentation

## 2023-08-27 DIAGNOSIS — Z992 Dependence on renal dialysis: Secondary | ICD-10-CM | POA: Insufficient documentation

## 2023-08-27 DIAGNOSIS — T829XXA Unspecified complication of cardiac and vascular prosthetic device, implant and graft, initial encounter: Principal | ICD-10-CM | POA: Diagnosis present

## 2023-08-27 DIAGNOSIS — R1084 Generalized abdominal pain: Secondary | ICD-10-CM | POA: Diagnosis not present

## 2023-08-27 DIAGNOSIS — I120 Hypertensive chronic kidney disease with stage 5 chronic kidney disease or end stage renal disease: Secondary | ICD-10-CM | POA: Insufficient documentation

## 2023-08-27 DIAGNOSIS — T82898A Other specified complication of vascular prosthetic devices, implants and grafts, initial encounter: Secondary | ICD-10-CM | POA: Diagnosis present

## 2023-08-27 DIAGNOSIS — D649 Anemia, unspecified: Secondary | ICD-10-CM | POA: Diagnosis not present

## 2023-08-27 DIAGNOSIS — T8242XA Displacement of vascular dialysis catheter, initial encounter: Secondary | ICD-10-CM | POA: Diagnosis present

## 2023-08-27 DIAGNOSIS — Z862 Personal history of diseases of the blood and blood-forming organs and certain disorders involving the immune mechanism: Secondary | ICD-10-CM | POA: Insufficient documentation

## 2023-08-27 DIAGNOSIS — Z451 Encounter for adjustment and management of infusion pump: Secondary | ICD-10-CM | POA: Diagnosis not present

## 2023-08-27 DIAGNOSIS — N186 End stage renal disease: Secondary | ICD-10-CM | POA: Diagnosis not present

## 2023-08-27 DIAGNOSIS — D61818 Other pancytopenia: Secondary | ICD-10-CM | POA: Diagnosis not present

## 2023-08-27 DIAGNOSIS — F17210 Nicotine dependence, cigarettes, uncomplicated: Secondary | ICD-10-CM | POA: Insufficient documentation

## 2023-08-27 LAB — CBC
HCT: 29.2 % — ABNORMAL LOW (ref 39.0–52.0)
Hemoglobin: 9.4 g/dL — ABNORMAL LOW (ref 13.0–17.0)
MCH: 26.3 pg (ref 26.0–34.0)
MCHC: 32.2 g/dL (ref 30.0–36.0)
MCV: 81.6 fL (ref 80.0–100.0)
Platelets: 135 K/uL — ABNORMAL LOW (ref 150–400)
RBC: 3.58 MIL/uL — ABNORMAL LOW (ref 4.22–5.81)
RDW: 17.2 % — ABNORMAL HIGH (ref 11.5–15.5)
WBC: 3.8 K/uL — ABNORMAL LOW (ref 4.0–10.5)
nRBC: 0 % (ref 0.0–0.2)

## 2023-08-27 LAB — CREATININE, SERUM
Creatinine, Ser: 15.83 mg/dL — ABNORMAL HIGH (ref 0.61–1.24)
GFR, Estimated: 3 mL/min — ABNORMAL LOW (ref >60)

## 2023-08-27 LAB — HIV ANTIBODY (ROUTINE TESTING W REFLEX): HIV Screen 4th Generation wRfx: NONREACTIVE

## 2023-08-27 MED ORDER — HEPARIN SODIUM (PORCINE) 5000 UNIT/ML IJ SOLN
5000.0000 [IU] | Freq: Three times a day (TID) | INTRAMUSCULAR | Status: DC
  Administered 2023-08-27: 5000 [IU] via SUBCUTANEOUS

## 2023-08-27 NOTE — Plan of Care (Signed)

## 2023-08-28 ENCOUNTER — Other Ambulatory Visit: Payer: Self-pay

## 2023-08-28 ENCOUNTER — Ambulatory Visit (HOSPITAL_COMMUNITY): Admit: 2023-08-28 | Admitting: Surgery

## 2023-08-28 ENCOUNTER — Inpatient Hospital Stay (HOSPITAL_COMMUNITY): Admitting: Anesthesiology

## 2023-08-28 ENCOUNTER — Encounter (HOSPITAL_COMMUNITY)
Admission: EM | Disposition: A | Discharge status: Home / Self Care | Payer: Self-pay | Source: Other Acute Inpatient Hospital | Attending: Hospitalist

## 2023-08-28 ENCOUNTER — Inpatient Hospital Stay (HOSPITAL_COMMUNITY)

## 2023-08-28 ENCOUNTER — Encounter (HOSPITAL_COMMUNITY): Payer: Self-pay | Admitting: Family Medicine

## 2023-08-28 DIAGNOSIS — T8249XA Other complication of vascular dialysis catheter, initial encounter: Secondary | ICD-10-CM | POA: Diagnosis not present

## 2023-08-28 DIAGNOSIS — I132 Hypertensive heart and chronic kidney disease with heart failure and with stage 5 chronic kidney disease, or end stage renal disease: Secondary | ICD-10-CM | POA: Diagnosis not present

## 2023-08-28 DIAGNOSIS — I509 Heart failure, unspecified: Secondary | ICD-10-CM

## 2023-08-28 DIAGNOSIS — T8249XS Other complication of vascular dialysis catheter, sequela: Secondary | ICD-10-CM | POA: Diagnosis not present

## 2023-08-28 DIAGNOSIS — T82898A Other specified complication of vascular prosthetic devices, implants and grafts, initial encounter: Secondary | ICD-10-CM | POA: Diagnosis present

## 2023-08-28 DIAGNOSIS — N186 End stage renal disease: Secondary | ICD-10-CM

## 2023-08-28 DIAGNOSIS — T8242XA Displacement of vascular dialysis catheter, initial encounter: Secondary | ICD-10-CM | POA: Diagnosis not present

## 2023-08-28 DIAGNOSIS — Z451 Encounter for adjustment and management of infusion pump: Secondary | ICD-10-CM | POA: Diagnosis not present

## 2023-08-28 DIAGNOSIS — Z992 Dependence on renal dialysis: Secondary | ICD-10-CM

## 2023-08-28 HISTORY — PX: INSERTION OF DIALYSIS CATHETER: SHX1324

## 2023-08-28 LAB — CBC WITH DIFFERENTIAL/PLATELET
Abs Immature Granulocytes: 0.01 K/uL (ref 0.00–0.07)
Basophils Absolute: 0 K/uL (ref 0.0–0.1)
Basophils Relative: 0 %
Eosinophils Absolute: 0.1 K/uL (ref 0.0–0.5)
Eosinophils Relative: 1 %
HCT: 28.4 % — ABNORMAL LOW (ref 39.0–52.0)
Hemoglobin: 9.3 g/dL — ABNORMAL LOW (ref 13.0–17.0)
Immature Granulocytes: 0 %
Lymphocytes Relative: 36 %
Lymphs Abs: 1.4 K/uL (ref 0.7–4.0)
MCH: 26.9 pg (ref 26.0–34.0)
MCHC: 32.7 g/dL (ref 30.0–36.0)
MCV: 82.1 fL (ref 80.0–100.0)
Monocytes Absolute: 0.3 K/uL (ref 0.1–1.0)
Monocytes Relative: 7 %
Neutro Abs: 2.2 K/uL (ref 1.7–7.7)
Neutrophils Relative %: 56 %
Platelets: 143 K/uL — ABNORMAL LOW (ref 150–400)
RBC: 3.46 MIL/uL — ABNORMAL LOW (ref 4.22–5.81)
RDW: 17.6 % — ABNORMAL HIGH (ref 11.5–15.5)
WBC: 3.9 K/uL — ABNORMAL LOW (ref 4.0–10.5)
nRBC: 0 % (ref 0.0–0.2)

## 2023-08-28 LAB — COMPREHENSIVE METABOLIC PANEL WITH GFR
ALT: 5 U/L (ref 0–44)
AST: 9 U/L — ABNORMAL LOW (ref 15–41)
Albumin: 2.7 g/dL — ABNORMAL LOW (ref 3.5–5.0)
Alkaline Phosphatase: 65 U/L (ref 38–126)
Anion gap: 18 — ABNORMAL HIGH (ref 5–15)
BUN: 41 mg/dL — ABNORMAL HIGH (ref 6–20)
CO2: 26 mmol/L (ref 22–32)
Calcium: 9 mg/dL (ref 8.9–10.3)
Chloride: 100 mmol/L (ref 98–111)
Creatinine, Ser: 16.26 mg/dL — ABNORMAL HIGH (ref 0.61–1.24)
GFR, Estimated: 3 mL/min — ABNORMAL LOW (ref >60)
Glucose, Bld: 84 mg/dL (ref 70–99)
Potassium: 4.2 mmol/L (ref 3.5–5.1)
Sodium: 144 mmol/L (ref 135–145)
Total Bilirubin: 0.6 mg/dL (ref 0.0–1.2)
Total Protein: 6.5 g/dL (ref 6.5–8.1)

## 2023-08-28 LAB — MRSA NEXT GEN BY PCR, NASAL: MRSA by PCR Next Gen: NOT DETECTED

## 2023-08-28 LAB — PHOSPHORUS: Phosphorus: 5.3 mg/dL — ABNORMAL HIGH (ref 2.5–4.6)

## 2023-08-28 LAB — MAGNESIUM: Magnesium: 1.9 mg/dL (ref 1.7–2.4)

## 2023-08-28 SURGERY — INSERTION OF DIALYSIS CATHETER
Anesthesia: Monitor Anesthesia Care | Site: Groin | Laterality: Right

## 2023-08-28 SURGERY — TUNNELLED CATHETER EXCHANGE

## 2023-08-28 MED ORDER — CEFAZOLIN SODIUM-DEXTROSE 2-3 GM-%(50ML) IV SOLR
INTRAVENOUS | Status: DC | PRN
Start: 2023-08-28 — End: 2023-08-28
  Administered 2023-08-28: 2 g via INTRAVENOUS

## 2023-08-28 MED ORDER — HEPARIN 6000 UNIT IRRIGATION SOLUTION
Status: DC | PRN
Start: 2023-08-28 — End: 2023-08-28
  Administered 2023-08-28: 1

## 2023-08-28 MED ORDER — LIDOCAINE-EPINEPHRINE (PF) 1 %-1:200000 IJ SOLN
INTRAMUSCULAR | Status: AC
  Filled 2023-08-28: qty 30

## 2023-08-28 MED ORDER — FENTANYL CITRATE (PF) 100 MCG/2ML IJ SOLN: 25.0000 ug | INTRAMUSCULAR | Status: DC | PRN

## 2023-08-28 MED ORDER — CHLORHEXIDINE GLUCONATE 0.12 % MT SOLN: 15.0000 mL | Freq: Once | OROMUCOSAL | Status: AC

## 2023-08-28 MED ORDER — LIDOCAINE 2% (20 MG/ML) 5 ML SYRINGE
INTRAMUSCULAR | Status: AC
  Filled 2023-08-28: qty 5

## 2023-08-28 MED ORDER — DROPERIDOL 2.5 MG/ML IJ SOLN: 0.6250 mg | Freq: Once | INTRAMUSCULAR | Status: DC | PRN

## 2023-08-28 MED ORDER — HEPARIN 6000 UNIT IRRIGATION SOLUTION
Status: AC
  Filled 2023-08-28: qty 500

## 2023-08-28 MED ORDER — OXYCODONE HCL 5 MG PO TABS: 5.0000 mg | ORAL_TABLET | Freq: Once | ORAL | Status: DC | PRN

## 2023-08-28 MED ORDER — HEPARIN SODIUM (PORCINE) 1000 UNIT/ML IJ SOLN
INTRAMUSCULAR | Status: DC | PRN
  Administered 2023-08-28: 1000 [IU] via INTRAVENOUS

## 2023-08-28 MED ORDER — OXYCODONE HCL 5 MG/5ML PO SOLN: 5.0000 mg | Freq: Once | ORAL | Status: DC | PRN

## 2023-08-28 MED ORDER — SODIUM CHLORIDE 0.9 % IV SOLN: INTRAVENOUS | Status: DC

## 2023-08-28 MED ORDER — LIDOCAINE 2% (20 MG/ML) 5 ML SYRINGE
INTRAMUSCULAR | Status: DC | PRN
  Administered 2023-08-28: 40 mg via INTRAVENOUS

## 2023-08-28 MED ORDER — POLYETHYLENE GLYCOL 3350 17 G PO PACK: 17.0000 g | PACK | Freq: Every day | ORAL | Status: DC

## 2023-08-28 MED ORDER — OXYCODONE-ACETAMINOPHEN 5-325 MG PO TABS
1.0000 | ORAL_TABLET | Freq: Four times a day (QID) | ORAL | Status: DC | PRN
  Filled 2023-08-28: qty 1

## 2023-08-28 MED ORDER — MIDAZOLAM HCL 2 MG/2ML IJ SOLN
INTRAMUSCULAR | Status: AC
Start: 2023-08-28 — End: 2023-08-28
  Filled 2023-08-28: qty 2

## 2023-08-28 MED ORDER — PROPOFOL 10 MG/ML IV BOLUS
INTRAVENOUS | Status: AC
  Filled 2023-08-28: qty 20

## 2023-08-28 MED ORDER — ACETAMINOPHEN 10 MG/ML IV SOLN: 1000.0000 mg | Freq: Once | INTRAVENOUS | Status: DC | PRN

## 2023-08-28 MED ORDER — LIDOCAINE-EPINEPHRINE (PF) 1 %-1:200000 IJ SOLN
INTRAMUSCULAR | Status: DC | PRN
  Administered 2023-08-28: 1 mL

## 2023-08-28 MED ORDER — 0.9 % SODIUM CHLORIDE (POUR BTL) OPTIME
TOPICAL | Status: DC | PRN
  Administered 2023-08-28: 1000 mL

## 2023-08-28 MED ORDER — HEPARIN SODIUM (PORCINE) 1000 UNIT/ML IJ SOLN
INTRAMUSCULAR | Status: AC
Start: 2023-08-28 — End: 2023-08-28
  Filled 2023-08-28: qty 10

## 2023-08-28 MED ORDER — ACETAMINOPHEN 325 MG PO TABS: 650.0000 mg | ORAL_TABLET | Freq: Four times a day (QID) | ORAL | Status: DC | PRN

## 2023-08-28 MED ORDER — FENTANYL CITRATE (PF) 250 MCG/5ML IJ SOLN
INTRAMUSCULAR | Status: DC | PRN
  Administered 2023-08-28: 50 ug via INTRAVENOUS

## 2023-08-28 MED ORDER — PANTOPRAZOLE SODIUM 40 MG PO TBEC: 40.0000 mg | DELAYED_RELEASE_TABLET | Freq: Every day | ORAL | Status: DC

## 2023-08-28 MED ORDER — HEPARIN SODIUM (PORCINE) 5000 UNIT/ML IJ SOLN
5000.0000 [IU] | Freq: Three times a day (TID) | INTRAMUSCULAR | Status: DC
  Administered 2023-08-28: 5000 [IU] via SUBCUTANEOUS
  Filled 2023-08-28 (×2): qty 1

## 2023-08-28 MED ORDER — CHLORHEXIDINE GLUCONATE 0.12 % MT SOLN
OROMUCOSAL | Status: AC
  Administered 2023-08-28: 15 mL via OROMUCOSAL
  Filled 2023-08-28: qty 15

## 2023-08-28 MED ORDER — ORAL CARE MOUTH RINSE: 15.0000 mL | Freq: Once | OROMUCOSAL | Status: AC

## 2023-08-28 MED ORDER — FENTANYL CITRATE (PF) 250 MCG/5ML IJ SOLN
INTRAMUSCULAR | Status: AC
  Filled 2023-08-28: qty 5

## 2023-08-28 MED ORDER — CEFAZOLIN SODIUM-DEXTROSE 2-4 GM/100ML-% IV SOLN
INTRAVENOUS | Status: DC
Start: 2023-08-28 — End: 2023-08-28
  Filled 2023-08-28: qty 100

## 2023-08-28 MED ORDER — MIDAZOLAM HCL 2 MG/2ML IJ SOLN
INTRAMUSCULAR | Status: DC | PRN
  Administered 2023-08-28 (×2): 1 mg via INTRAVENOUS

## 2023-08-28 MED ORDER — LACTATED RINGERS IV SOLN: INTRAVENOUS | Status: DC

## 2023-08-28 MED ORDER — PROPOFOL 500 MG/50ML IV EMUL
INTRAVENOUS | Status: DC | PRN
  Administered 2023-08-28 (×4): 20 mg via INTRAVENOUS
  Administered 2023-08-28: 75 ug/kg/min via INTRAVENOUS

## 2023-08-28 SURGICAL SUPPLY — 36 items
BAG COUNTER SPONGE SURGICOUNT (BAG) ×1 IMPLANT
BIOPATCH RED 1 DISK 7.0 (GAUZE/BANDAGES/DRESSINGS) ×1 IMPLANT
BLADE SURG 11 STRL SS (BLADE) IMPLANT
COVER PROBE W GEL 5X96 (DRAPES) ×1 IMPLANT
COVER SURGICAL LIGHT HANDLE (MISCELLANEOUS) ×1 IMPLANT
DERMABOND ADVANCED .7 DNX12 (GAUZE/BANDAGES/DRESSINGS) ×1 IMPLANT
DRAPE C-ARM 42X72 X-RAY (DRAPES) ×1 IMPLANT
DRAPE CHEST BREAST 15X10 FENES (DRAPES) ×1 IMPLANT
GAUZE 4X4 16PLY ~~LOC~~+RFID DBL (SPONGE) ×1 IMPLANT
GLIDEWIRE ADV .035X180CM (WIRE) IMPLANT
GLOVE BIOGEL PI IND STRL 8 (GLOVE) IMPLANT
GOWN STRL REUS W/ TWL LRG LVL3 (GOWN DISPOSABLE) ×1 IMPLANT
GOWN STRL REUS W/ TWL XL LVL3 (GOWN DISPOSABLE) ×1 IMPLANT
GOWN STRL REUS W/TWL 2XL LVL3 (GOWN DISPOSABLE) IMPLANT
KIT BASIN OR (CUSTOM PROCEDURE TRAY) ×1 IMPLANT
KIT CATH PALINDROME-P 55 (CATHETERS) IMPLANT
KIT TURNOVER KIT B (KITS) ×1 IMPLANT
NDL 18GX1X1/2 (RX/OR ONLY) (NEEDLE) ×1 IMPLANT
NDL HYPO 25GX1X1/2 BEV (NEEDLE) ×1 IMPLANT
NEEDLE 18GX1X1/2 (RX/OR ONLY) (NEEDLE) ×1 IMPLANT
NEEDLE HYPO 25GX1X1/2 BEV (NEEDLE) ×1 IMPLANT
NS IRRIG 1000ML POUR BTL (IV SOLUTION) ×1 IMPLANT
PACK SRG BSC III STRL LF ECLPS (CUSTOM PROCEDURE TRAY) ×1 IMPLANT
PAD ARMBOARD POSITIONER FOAM (MISCELLANEOUS) ×2 IMPLANT
PENCIL SMOKE EVACUATOR (MISCELLANEOUS) IMPLANT
SOAP 2 % CHG 4 OZ (WOUND CARE) ×1 IMPLANT
SUT ETHILON 3 0 PS 1 (SUTURE) ×1 IMPLANT
SUT MNCRL AB 4-0 PS2 18 (SUTURE) ×1 IMPLANT
SUT VIC AB 3-0 SH 27X BRD (SUTURE) IMPLANT
SYR 10ML LL (SYRINGE) ×1 IMPLANT
SYR 20ML LL LF (SYRINGE) ×2 IMPLANT
SYR 5ML LL (SYRINGE) ×1 IMPLANT
SYR CONTROL 10ML LL (SYRINGE) ×1 IMPLANT
TOWEL GREEN STERILE (TOWEL DISPOSABLE) ×1 IMPLANT
TOWEL GREEN STERILE FF (TOWEL DISPOSABLE) ×2 IMPLANT
WATER STERILE IRR 1000ML POUR (IV SOLUTION) ×1 IMPLANT

## 2023-08-28 NOTE — Anesthesia Preprocedure Evaluation (Addendum)
 Anesthesia Evaluation  Patient identified by MRN, date of birth, ID band Patient awake    Reviewed: Allergy & Precautions, NPO status , Patient's Chart, lab work & pertinent test results  History of Anesthesia Complications Negative for: history of anesthetic complications  Airway Mallampati: II  TM Distance: >3 FB Neck ROM: Full    Dental no notable dental hx.    Pulmonary Current Smoker and Patient abstained from smoking.   Pulmonary exam normal        Cardiovascular hypertension, +CHF   Rhythm:Regular Rate:Normal     Neuro/Psych  Headaches  Anxiety        GI/Hepatic Neg liver ROS,GERD  Medicated,,crohns   Endo/Other  negative endocrine ROS    Renal/GU ESRFRenal disease  negative genitourinary   Musculoskeletal negative musculoskeletal ROS (+)    Abdominal Normal abdominal exam  (+)   Peds  Hematology  (+) Blood dyscrasia, anemia   Anesthesia Other Findings   Reproductive/Obstetrics                              Anesthesia Physical Anesthesia Plan  ASA: 3  Anesthesia Plan: MAC   Post-op Pain Management:    Induction: Intravenous  PONV Risk Score and Plan: 2 and Ondansetron , Dexamethasone  and TIVA  Airway Management Planned: Natural Airway and Simple Face Mask  Additional Equipment: None  Intra-op Plan:   Post-operative Plan:   Informed Consent: I have reviewed the patients History and Physical, chart, labs and discussed the procedure including the risks, benefits and alternatives for the proposed anesthesia with the patient or authorized representative who has indicated his/her understanding and acceptance.     Dental advisory given  Plan Discussed with: CRNA  Anesthesia Plan Comments:         Anesthesia Quick Evaluation

## 2023-08-28 NOTE — Care Management CC44 (Signed)
 Condition Code 44 Documentation Completed  Patient Details  Name: Olander Friedl Romas MRN: [id number] Date of Birth: 07-06-1975   Condition Code 44 given:  Yes Patient signature on Condition Code 44 notice:  Yes Documentation of 2 MD's agreement:  Yes Code 44 added to claim:  Yes    Tom-Johnson, Harvest [place], RN 08/28/2023, 3:55 PM

## 2023-08-28 NOTE — Consult Note (Signed)
 Hospital Consult    Reason for Consult: Dislodged tunneled dialysis catheter Requesting Physician: [facility] internal medicine MRN #:  [id number]  History of Present Illness: This is a 48 y.o. male with end-stage renal disease well-known to vascular surgery having undergone multiple long-term HD access attempts, most recently, the patient had left sided hero graft resected and tunneled line placed. I was called yesterday by [facility] concerned that the tunneled dialysis catheter had become dislodged.  This could not be manage it ran off as they did not have the appropriately sized catheter.  He was transferred to [facility] for further care.  On exam, he was resting comfortably.  No complaints  Past Medical History:  Diagnosis Date   Anemia    Anxiety    History of recent blood transfusion    Hypertension    Lupus    Renal disease     Past Surgical History:  Procedure Laterality Date   ARTERY REPAIR Left 07/06/2023   Procedure: PRIMARY END TO END ANASTOMOSIS OF LEFT BRACHIAL ARTERY;  Surgeon: Lanis Fonda BRAVO, MD;  Location: [facility] OR;  Service: Vascular;  Laterality: Left;   AV FISTULA PLACEMENT     ESOPHAGOGASTRODUODENOSCOPY (EGD) WITH PROPOFOL  N/A 04/08/2021   Procedure: ESOPHAGOGASTRODUODENOSCOPY (EGD) WITH PROPOFOL ;  Surgeon: Rollin Dover, MD;  Location: [facility] ENDOSCOPY;  Service: Gastroenterology;  Laterality: N/A;   EXCHANGE OF A DIALYSIS CATHETER Right 07/06/2023   Procedure: EXCHANGE OF RIGHT FEMORAL VEIN DIALYSIS CATHETER;  Surgeon: Lanis Fonda BRAVO, MD;  Location: [facility] OR;  Service: Vascular;  Laterality: Right;   IR FLUORO GUIDE CV LINE RIGHT  09/23/2021   IR FLUORO GUIDE CV LINE RIGHT  02/23/2022   PERITONEAL CATHETER INSERTION     REMOVAL OF GRAFT Left 07/06/2023   Procedure: REMOVAL LEFT ARM HERO GRAFT;  Surgeon: Lanis Fonda BRAVO, MD;  Location: [facility] OR;  Service: Vascular;  Laterality: Left;   REVISON OF ARTERIOVENOUS FISTULA Right 09/12/2019   Procedure:  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 artery with reversed saphenous vein from left ankle;  Surgeon: Oris Krystal FALCON, MD;  Location: [facility] OR;  Service: Vascular;  Laterality: Right;   TUNNELLED CATHETER EXCHANGE N/A 07/10/2023   Procedure: TUNNELLED CATHETER EXCHANGE;  Surgeon: Serene Gaile ORN, MD;  Location: [facility] INVASIVE CV LAB;  Service: Cardiovascular;  Laterality: N/A;   VEIN HARVEST Left 09/12/2019   Procedure: SAPHENOUS VEIN HARVEST;  Surgeon: Oris Krystal FALCON, MD;  Location: [facility] OR;  Service: Vascular;  Laterality: Left;   VEIN HARVEST Right 07/06/2023   Procedure: RIGHT GREATER SAPHENECTOMY;  Surgeon: Lanis Fonda BRAVO, MD;  Location: [facility] OR;  Service: Vascular;  Laterality: Right;   WOUND DEBRIDEMENT Right 09/26/2019   Procedure: RIGHT UPPER EXTREMITY WOUND WASHOUT;  Surgeon: Oris Krystal FALCON, MD;  Location: [facility] OR;  Service: Vascular;  Laterality: Right;    Allergies  Allergen Reactions   Firvanq  [Vancomycin ] Itching    Prior to Admission medications   Medication Sig Start Date End Date Taking? Authorizing Provider  acetaminophen  (TYLENOL ) 500 MG tablet Take 1,000 mg by mouth 2 (two) times daily as needed for moderate pain (pain score 4-6), fever or headache.    [provider]  aspirin  EC 81 MG tablet Take 1 tablet (81 mg total) by mouth daily. Swallow whole. 07/11/23   Baglia, Corrina, PA-C  ferric citrate  (AURYXIA ) 1 GM 210 MG(Fe) tablet Take 630 mg by mouth in the morning, at noon, and at bedtime.    [provider]  LOKELMA  10 g PACK packet Take 10 g by mouth See admin instructions. Take 1 packet (10g) by mouth once daily on non-dialysis days only. (Sunday, Tuesday, Thursday, Saturday).    [provider]  oxyCODONE -acetaminophen  (PERCOCET) 5-325 MG tablet Take 1 tablet by mouth every 6 (six) hours as needed for severe pain (pain score 7-10). 07/11/23 07/10/24  Charlyne Reed, PA-C    Social History   Socioeconomic History   Marital status:  Single    Spouse name: Not on file   Number of children: Not on file   Years of education: 12   Highest education level: Not on file  Occupational History   Occupation: Disabled  Tobacco Use   Smoking status: Every Day    Current packs/day: 0.50    Average packs/day: 0.5 packs/day for 15.0 years (7.5 ttl pk-yrs)    Types: Cigarettes   Smokeless tobacco: Never  Vaping Use   Vaping status: Never Used  Substance and Sexual Activity   Alcohol use: Never   Drug use: Never   Sexual activity: Yes    Birth control/protection: Condom  Other Topics Concern   Not on file  Social History Narrative   Not on file   Social Drivers of Health   Financial Resource Strain: Medium Risk (01/04/2021)   Received from [facility]   Overall Financial Resource Strain (CARDIA)    Difficulty of Paying Living Expenses: Somewhat hard  Food Insecurity: No Food Insecurity (08/27/2023)   Hunger Vital Sign    Worried About Running Out of Food in the Last Year: Never true    Ran Out of Food in the Last Year: Never true  Transportation Needs: No Transportation Needs (08/27/2023)   PRAPARE - Administrator, Civil Service (Medical): No    Lack of Transportation (Non-Medical): No  Recent Concern: Transportation Needs - Unmet Transportation Needs (07/06/2023)   PRAPARE - Transportation    Lack of Transportation (Medical): Yes    Lack of Transportation (Non-Medical): Yes  Physical Activity: Not on file  Stress: Stress Concern Present (01/04/2021)   Received from [facility] - Occupational Stress Questionnaire    Feeling of Stress : To some extent  Social Connections: Unknown (05/11/2021)   Received from [facility]   Social Network    Social Network: Not on file  Intimate Partner Violence: Not At Risk (08/27/2023)   Humiliation, Afraid, Rape, and Kick questionnaire    Fear of Current or Ex-Partner: No    Emotionally Abused: No    Physically Abused:  No    Sexually Abused: No   History reviewed. No pertinent family history.  ROS: Otherwise negative unless mentioned in HPI  Physical Examination  Vitals:   08/27/23 2053 08/28/23 0329  BP: (!) 163/86 (!) 183/99  Pulse: 60 (!) 59  Resp: 18 18  Temp: 98.2 F (36.8 C) 98.5 F (36.9 C)  SpO2: 100% 100%   Body mass index is 21.82 kg/m.  General:  WDWN in NAD Gait: Not observed HENT: WNL, normocephalic Pulmonary: normal non-labored breathing, without Rales, rhonchi,  wheezing Cardiac: regular Abdomen:  soft, NT/ND, no masses Skin: without rashes Vascular Exam/Pulses: WWP, Left arm 2+ Extremities: without ischemic changes Musculoskeletal: no muscle wasting or atrophy  Neurologic: A&O X 3;  No focal weakness or paresthesias are detected; speech is fluent/normal Psychiatric:  The pt has Normal affect. Lymph:  Unremarkable  CBC    Component Value Date/Time   WBC  3.9 (L) 08/28/2023 0520   RBC 3.46 (L) 08/28/2023 0520   HGB 9.3 (L) 08/28/2023 0520   HCT 28.4 (L) 08/28/2023 0520   PLT 143 (L) 08/28/2023 0520   MCV 82.1 08/28/2023 0520   MCH 26.9 08/28/2023 0520   MCHC 32.7 08/28/2023 0520   RDW 17.6 (H) 08/28/2023 0520   LYMPHSABS 1.4 08/28/2023 0520   MONOABS 0.3 08/28/2023 0520   EOSABS 0.1 08/28/2023 0520   BASOSABS 0.0 08/28/2023 0520    BMET    Component Value Date/Time   NA 144 08/28/2023 0520   K 4.2 08/28/2023 0520   CL 100 08/28/2023 0520   CO2 26 08/28/2023 0520   GLUCOSE 84 08/28/2023 0520   BUN 41 (H) 08/28/2023 0520   CREATININE 16.26 (H) 08/28/2023 0520   CALCIUM 9.0 08/28/2023 0520   GFRNONAA 3 (L) 08/28/2023 0520   GFRAA 3 (L) 09/13/2019 0429    COAGS: No results found for: INR, PROTIME     ASSESSMENT/PLAN: This is a 48 y.o. male with end-stage renal disease in need of tunneled dialysis catheter exchange.  At the time of his hero graft removal, there was a lumen in the left internal jugular vein, and left-sided brachiocephalic  vein.  After discussing the risks and benefits of right femoral tunneled dialysis catheter exchange, the patient elected to proceed.    Fonda FORBES Rim MD [facility] [phone] 08/28/2023  8:04 AM

## 2023-08-28 NOTE — H&P (Signed)
 History and Physical    Nathan Castaneda FMW:[id number] DOB: 1975/12/02 DOA: 08/27/2023  PCP: Norine Manuelita LABOR, MD  Patient coming from: home  I have personally briefly reviewed patient's old medical records in [facility]  Chief Complaint: vascular access issue  HPI: Nathan Castaneda is Nathan Castaneda 48 y.o. male with medical history significant of ESRD (MWF HD), anemia, hypertension and other medical issues here with displaced tunneled R femoral HD catheter.  He notes this occurred on Sunday.  He went to [facility] and is being transferred to [facility] as the surgeons at [facility] were not able to resolve this issue.  In addition he notes abdominal pain which is chronic (months).  He notes he hasn't gotten Nathan Castaneda straight answer for this.  Otherwise, no other complaints.   Notably he was admitted to vascular services between 6/27-7/2.  He underwent tunneled dialysis catheter exchange in R common femoral artery, hero graft resection, right calf saphectomy, redo exposure of L brachial artery, transection of L brachial artery with primary repair end to end anastomosis at the antecubital fossa.  TDC again exchanged in OR on 7/1 due to nonfunctioning TDC.    Review of Systems: As per HPI otherwise all other systems reviewed and are negative.  Past Medical History:  Diagnosis Date   Anemia    Anxiety    History of recent blood transfusion    Hypertension    Lupus    Renal disease     Past Surgical History:  Procedure Laterality Date   ARTERY REPAIR Left 07/06/2023   Procedure: PRIMARY END TO END ANASTOMOSIS OF LEFT BRACHIAL ARTERY;  Surgeon: Lanis Fonda BRAVO, MD;  Location: [facility] OR;  Service: Vascular;  Laterality: Left;   AV FISTULA PLACEMENT     ESOPHAGOGASTRODUODENOSCOPY (EGD) WITH PROPOFOL  Nathan Castaneda 04/08/2021   Procedure: ESOPHAGOGASTRODUODENOSCOPY (EGD) WITH PROPOFOL ;  Surgeon: Rollin Dover, MD;  Location: [facility] ENDOSCOPY;  Service: Gastroenterology;  Laterality: N/Eyana Stolze;   EXCHANGE OF Nathan Castaneda DIALYSIS CATHETER  Right 07/06/2023   Procedure: EXCHANGE OF RIGHT FEMORAL VEIN DIALYSIS CATHETER;  Surgeon: Lanis Fonda BRAVO, MD;  Location: [facility] OR;  Service: Vascular;  Laterality: Right;   IR FLUORO GUIDE CV LINE RIGHT  09/23/2021   IR FLUORO GUIDE CV LINE RIGHT  02/23/2022   PERITONEAL CATHETER INSERTION     REMOVAL OF GRAFT Left 07/06/2023   Procedure: REMOVAL LEFT ARM HERO GRAFT;  Surgeon: Lanis Fonda BRAVO, MD;  Location: [facility] OR;  Service: Vascular;  Laterality: Left;   REVISON OF ARTERIOVENOUS FISTULA Right 09/12/2019   Procedure: 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 artery with reversed saphenous vein from left ankle;  Surgeon: Oris Krystal FALCON, MD;  Location: [facility] OR;  Service: Vascular;  Laterality: Right;   TUNNELLED CATHETER EXCHANGE N/Nathan Castaneda 07/10/2023   Procedure: TUNNELLED CATHETER EXCHANGE;  Surgeon: Serene Gaile ORN, MD;  Location: [facility] INVASIVE CV LAB;  Service: Cardiovascular;  Laterality: N/Nathan Castaneda;   VEIN HARVEST Left 09/12/2019   Procedure: SAPHENOUS VEIN HARVEST;  Surgeon: Oris Krystal FALCON, MD;  Location: [facility] OR;  Service: Vascular;  Laterality: Left;   VEIN HARVEST Right 07/06/2023   Procedure: RIGHT GREATER SAPHENECTOMY;  Surgeon: Lanis Fonda BRAVO, MD;  Location: [facility] OR;  Service: Vascular;  Laterality: Right;   WOUND DEBRIDEMENT Right 09/26/2019   Procedure: RIGHT UPPER EXTREMITY WOUND WASHOUT;  Surgeon: Oris Krystal FALCON, MD;  Location: [facility] OR;  Service: Vascular;  Laterality: Right;    Social History  reports that he has been smoking cigarettes.  He has Nathan Castaneda 7.5 pack-year smoking history. He has never used smokeless tobacco. He reports that he does not drink alcohol and does not use drugs.  Allergies  Allergen Reactions   Firvanq  [Vancomycin ] Itching    History reviewed. No pertinent family history.   Prior to Admission medications   Medication Sig Start Date End Date Taking? Authorizing Provider  acetaminophen  (TYLENOL ) 500 MG tablet Take 1,000 mg by mouth 2 (two) times daily as needed  for moderate pain (pain score 4-6), fever or headache.    [provider]  aspirin  EC 81 MG tablet Take 1 tablet (81 mg total) by mouth daily. Swallow whole. 07/11/23   Baglia, Corrina, PA-C  ferric citrate  (AURYXIA ) 1 GM 210 MG(Fe) tablet Take 630 mg by mouth in the morning, at noon, and at bedtime.    [provider]  LOKELMA  10 g PACK packet Take 10 g by mouth See admin instructions. Take 1 packet (10g) by mouth once daily on non-dialysis days only. (Sunday, Tuesday, Thursday, Saturday).    [provider]  oxyCODONE -acetaminophen  (PERCOCET) 5-325 MG tablet Take 1 tablet by mouth every 6 (six) hours as needed for severe pain (pain score 7-10). 07/11/23 07/10/24  Charlyne Reed, PA-C    Physical Exam: Vitals:   08/27/23 1830 08/27/23 2053 08/28/23 0329  BP: (!) 159/90 (!) 163/86 (!) 183/99  Pulse: 64 60 (!) 59  Resp: 18 18 18   Temp: 98 F (36.7 C) 98.2 F (36.8 C) 98.5 F (36.9 C)  TempSrc: Oral Oral   SpO2: 100% 100% 100%  Weight: 69 kg    Height: 5' 10 (1.778 m)      Constitutional: NAD, calm, comfortable Vitals:   08/27/23 1830 08/27/23 2053 08/28/23 0329  BP: (!) 159/90 (!) 163/86 (!) 183/99  Pulse: 64 60 (!) 59  Resp: 18 18 18   Temp: 98 F (36.7 C) 98.2 F (36.8 C) 98.5 F (36.9 C)  TempSrc: Oral Oral   SpO2: 100% 100% 100%  Weight: 69 kg    Height: 5' 10 (1.778 m)     Eyes: PERRL ENMT: Mucous membranes are moist. .  Neck: normal, supple Respiratory: clear to auscultation bilaterally, no wheezing, no crackles Cardiovascular: Regular rate and rhythm, no murmurs / rubs / gallops. RLE with displaced HD cath - intact distal pulses Abdomen: no tenderness, no masses palpated. Musculoskeletal: no clubbing / cyanosis.  Skin: no rashes, lesions, ulcers. No induration Neurologic: CN 2-12 grossly intact. Moving all extremities Psychiatric: Normal judgment and insight. Alert and oriented x 3. Normal mood.   Labs on Admission: I have personally  reviewed following labs and imaging studies  CBC: Recent Labs  Lab 08/27/23 2049  WBC 3.8*  HGB 9.4*  HCT 29.2*  MCV 81.6  PLT 135*    Basic Metabolic Panel: Recent Labs  Lab 08/27/23 2049  CREATININE 15.83*    GFR: Estimated Creatinine Clearance: 5.7 mL/min (Nathan Castaneda) (by C-G formula based on SCr of 15.83 mg/dL (H)).  Liver Function Tests: No results for input(s): AST, ALT, ALKPHOS, BILITOT, PROT, ALBUMIN in the last 168 hours.  Urine analysis: No results found for: COLORURINE, APPEARANCEUR, LABSPEC, PHURINE, GLUCOSEU, HGBUR, BILIRUBINUR, KETONESUR, PROTEINUR, UROBILINOGEN, NITRITE, LEUKOCYTESUR  Radiological Exams on Admission: No results found.  EKG: Independently reviewed. pending  Assessment/Plan Principal Problem:   Complication of vascular dialysis catheter Active Problems:   Problem with dialysis access [facility])    Assessment and Plan:  Complication of Dialysis Catheter Displaced Right Sided Tunneled Dialysis Cathter Presented as  transfer from [place]  Dialysis catheter noted to be displaced Sunday Vascular notified via secure chat  ESRD (MWF HD) Please call renal in the AM Last dialysis was Friday Doesn't appear he's taking his auryxia  or lokelma   Anemia Pancytopenia Trend  Chronic Abdominal Pain Exam is benign, he notes pain for months Bowel regimen  Additional w/u as indicated  Hx Lupus Not on any meds  HTN Not on any meds    DVT prophylaxis: heparin   Code Status:   full  Family Communication:  none  Disposition Plan:   Patient is from:  [employer] DC to:  home  Anticipated DC date:  pending  Anticipated DC barriers: Pending vascular c/s, resolution of access issues  Consults called:  Vascular via secure chat  Admission status:  inpt   Severity of Illness: The appropriate patient status for this patient is INPATIENT. Inpatient status is judged to be reasonable and necessary in  order to provide the required intensity of service to ensure the patient's safety. The patient's presenting symptoms, physical exam findings, and initial radiographic and laboratory data in the context of their chronic comorbidities is felt to place them at high risk for further clinical deterioration. Furthermore, it is not anticipated that the patient will be medically stable for discharge from the hospital within 2 midnights of admission.   * I certify that at the point of admission it is my clinical judgment that the patient will require inpatient hospital care spanning beyond 2 midnights from the point of admission due to high intensity of service, high risk for further deterioration and high frequency of surveillance required.Nathan Meliton Monte MD [facility]  How to contact the [facility] Attending or Consulting provider 7A - 7P or covering provider during after hours 7P -7A, for this patient?   Check the care team in [facility] and look for Dulcy Sida) attending/consulting TRH provider listed and b) the TRH team listed Log into [email or web address] and use [place]'s universal password to access. If you do not have the password, please contact the hospital operator. Locate the TRH provider you are looking for under [facility] and page to Lynnsey Barbara number that you can be directly reached. If you still have difficulty reaching the provider, please page the [facility] (Director on Call) for the Hospitalists listed on amion for assistance.  08/28/2023, 5:13 AM

## 2023-08-28 NOTE — Consult Note (Signed)
 Renal Service Consult Note [facility] Nathan JONETTA Fret, MD  Patient: Nathan Castaneda 08/28/2023 Requesting Physician: Dr. Odell Castor  Reason for Consult: ESRD pt w/ dislodged HD catheter HPI: The patient is a 48 y.o. year-old w/ PMH as below who presented to [facility]) on Sunday due to dislodged HD catheter. They didn't have the right size to replace it at [facility], so patient was transferred to [facility]. Pt has long hx of access issues. Pt was seen by VVS here and went to OR and had a new femoral TDC placed by Dr Lanis this morning. We are asked to see for ESRD.    Pt seen in room. Pt states his last HD was last week on Friday. He denies any LE edema, or SOB. Wants to go home and get his regular  HD tomorrow at 10am.    ROS - denies CP, no joint pain, no HA, no blurry vision, no rash, no diarrhea, no nausea/ vomiting   Past Medical History  Past Medical History:  Diagnosis Date   Anemia    Anxiety    History of recent blood transfusion    Hypertension    Lupus    Renal disease    Past Surgical History  Past Surgical History:  Procedure Laterality Date   ARTERY REPAIR Left 07/06/2023   Procedure: PRIMARY END TO END ANASTOMOSIS OF LEFT BRACHIAL ARTERY;  Surgeon: Lanis Fonda BRAVO, MD;  Location: [facility] OR;  Service: Vascular;  Laterality: Left;   AV FISTULA PLACEMENT     ESOPHAGOGASTRODUODENOSCOPY (EGD) WITH PROPOFOL  N/A 04/08/2021   Procedure: ESOPHAGOGASTRODUODENOSCOPY (EGD) WITH PROPOFOL ;  Surgeon: Rollin Dover, MD;  Location: [facility] ENDOSCOPY;  Service: Gastroenterology;  Laterality: N/A;   EXCHANGE OF A DIALYSIS CATHETER Right 07/06/2023   Procedure: EXCHANGE OF RIGHT FEMORAL VEIN DIALYSIS CATHETER;  Surgeon: Lanis Fonda BRAVO, MD;  Location: [facility] OR;  Service: Vascular;  Laterality: Right;   IR FLUORO GUIDE CV LINE RIGHT  09/23/2021   IR FLUORO GUIDE CV LINE RIGHT  02/23/2022   PERITONEAL CATHETER INSERTION     REMOVAL OF GRAFT Left 07/06/2023   Procedure: REMOVAL LEFT  ARM HERO GRAFT;  Surgeon: Lanis Fonda BRAVO, MD;  Location: [facility] OR;  Service: Vascular;  Laterality: Left;   REVISON OF ARTERIOVENOUS FISTULA Right 09/12/2019   Procedure: 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 artery with reversed saphenous vein from left ankle;  Surgeon: Oris Krystal FALCON, MD;  Location: [facility] OR;  Service: Vascular;  Laterality: Right;   TUNNELLED CATHETER EXCHANGE N/A 07/10/2023   Procedure: TUNNELLED CATHETER EXCHANGE;  Surgeon: Serene Gaile ORN, MD;  Location: [facility] INVASIVE CV LAB;  Service: Cardiovascular;  Laterality: N/A;   VEIN HARVEST Left 09/12/2019   Procedure: SAPHENOUS VEIN HARVEST;  Surgeon: Oris Krystal FALCON, MD;  Location: [facility] OR;  Service: Vascular;  Laterality: Left;   VEIN HARVEST Right 07/06/2023   Procedure: RIGHT GREATER SAPHENECTOMY;  Surgeon: Lanis Fonda BRAVO, MD;  Location: [facility] OR;  Service: Vascular;  Laterality: Right;   WOUND DEBRIDEMENT Right 09/26/2019   Procedure: RIGHT UPPER EXTREMITY WOUND WASHOUT;  Surgeon: Oris Krystal FALCON, MD;  Location: [facility] OR;  Service: Vascular;  Laterality: Right;   Family History History reviewed. No pertinent family history. Social History  reports that he has been smoking cigarettes. He has a 7.5 pack-year smoking history. He has never used smokeless tobacco. He reports that he does not drink alcohol and does not use drugs. Allergies  Allergies  Allergen Reactions  Firvanq  [Vancomycin ] Itching   Home medications Prior to Admission medications   Medication Sig Start Date End Date Taking? Authorizing Provider  acetaminophen  (TYLENOL ) 500 MG tablet Take 1,000 mg by mouth 2 (two) times daily as needed for moderate pain (pain score 4-6), fever or headache.   Yes [provider]  ferric citrate  (AURYXIA ) 1 GM 210 MG(Fe) tablet Take 630 mg by mouth in the morning, at noon, and at bedtime.   Yes [provider]  LOKELMA  10 g PACK packet Take 10 g by mouth daily.   Yes [provider]   ondansetron  (ZOFRAN ) 4 MG tablet Take 4 mg by mouth every 8 (eight) hours as needed for nausea or vomiting.   Yes [provider]  pantoprazole  (PROTONIX ) 40 MG tablet Take 40 mg by mouth daily.   Yes [provider]  aspirin  EC 81 MG tablet Take 1 tablet (81 mg total) by mouth daily. Swallow whole. Patient not taking: Reported on 08/28/2023 07/11/23   Charlyne Reed, PA-C     Vitals:   08/28/23 1121 08/28/23 1130 08/28/23 1136 08/28/23 1145  BP: 118/83 123/88 123/85 123/88  Pulse: 75 68 64 63  Resp: 13 15 12 13   Temp: 97.7 F (36.5 C)   97.7 F (36.5 C)  TempSrc:      SpO2: 100% 99% 99% 98%  Weight:      Height:       Exam Gen alert, no distress No rash, cyanosis or gangrene Sclera anicteric, throat clear  No jvd or bruits Chest clear bilat to bases, no rales/ wheezing RRR no MRG Abd soft ntnd no mass or ascites +bs GU deferred MS no joint effusions or deformity Ext trace LE edema, no other edema Neuro is alert, Ox 3 , nf    R fem TDC intact   Home bp meds:    OP HD: MWF [facility] 4h  B400  70kg  2K bath   fem TDC  Heparin  none Last OP HD 8/15, post wt 70.1 Has low gains and gets to his dry wt S/o early at 3.5h about half of the sessions  Labs -> Na 144  K 4.2  BUN 41  creat 16   Hb 9.3     Assessment/ Plan: Dislodged HD cath: [facility] dislodged this weekend. Went to [facility] who sent him here for [facility] replacement. VVS Dr Lanis replaced his R fem TDC this morning.  ESRD: on HD MWF. Last HD Friday. Pt is stable, no vol or electrolyte issues. Has OP HD tomorrow at 10 am. He has very good OP HD compliance. Does not need to wait for hospital dialysis today (won't be dialyzed til the evening at the soonest). Recommend d/c patient and he will go to his OP session tomorrow morning.  BP: bp's low-normal, not on any BP lowering meds at home.  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       Nathan Fret  MD  [facility] 08/28/2023, 1:02 PM  Recent Labs  Lab 08/27/23 2049 08/28/23 0520  HGB 9.4* 9.3*  ALBUMIN  --  2.7*  CALCIUM  --  9.0  PHOS  --  5.3*  CREATININE 15.83* 16.26*  K  --  4.2   Inpatient medications:  heparin   5,000 Units Subcutaneous Q8H   pantoprazole   40 mg Oral Daily   polyethylene glycol  17 g Oral Daily    ceFAZolin   acetaminophen , ceFAZolin , oxyCODONE -acetaminophen 

## 2023-08-28 NOTE — Care Management Obs Status (Signed)
 MEDICARE OBSERVATION STATUS NOTIFICATION   Patient Details  Name: Nathan Castaneda MRN: [id number] Date of Birth: 26-Dec-1975   Medicare Observation Status Notification Given:  Yes    Tom-Johnson, Harvest Muskrat, RN 08/28/2023, 3:55 PM

## 2023-08-28 NOTE — Plan of Care (Signed)
  Problem: Fluid Volume: Goal: Compliance with measures to maintain balanced fluid volume will improve Outcome: Progressing  Evaluate response to treatment Monitor signs of fluid imbalance Monitor signs and symptoms of electrolyte imbalance Support fluid restrictions compliance

## 2023-08-28 NOTE — Transfer of Care (Signed)
 Immediate Anesthesia Transfer of Care Note  Patient: Nathan Castaneda  Procedure(s) Performed: TUNNELED DIALYSIS CATHETER EXCHANGE (Right: Groin)  Patient Location: PACU  Anesthesia Type:MAC  Level of Consciousness: drowsy  Airway & Oxygen Therapy: Patient Spontanous Breathing  Post-op Assessment: Report given to RN and Post -op Vital signs reviewed and stable  Post vital signs: Reviewed and stable  Last Vitals:  Vitals Value Taken Time  BP 118/83 08/28/23 11:21  Temp 97.7   Pulse 74 08/28/23 11:22  Resp 11 08/28/23 11:22  SpO2 99 % 08/28/23 11:22  Vitals shown include unfiled device data.  Last Pain:  Vitals:   08/28/23 1019  TempSrc:   PainSc: 0-No pain      Patients Stated Pain Goal: 0 (08/28/23 0407)  Complications: No notable events documented.

## 2023-08-28 NOTE — Discharge Planning (Signed)
 [facility] Patient Discharge Orders- [facility] CLINIC: [place]  Patient's name: Nathan Castaneda Admit/DC Dates: 08/27/2023 - 08/28/2023  Discharge Diagnoses: Dislodged dialysis catheter. Replaced today per VVS.     Outpatient Dialysis Orders:  Did not receive dialysis this admission. No change to outpatient orders.   Anemia Aranesp : Given: --   Date of last dose/amount: --   PRBC's Given: -- Date/# of units: --   Recent Labs  Lab 08/28/23 0520  HGB 9.3*  K 4.2  CALCIUM 9.0  PHOS 5.3*  ALBUMIN 2.7*   Access intervention/Change:  Exchange of right femoral dialysis catheter 08/28/23. Dr. Lanis   Medications: -IV Antibiotics: -- -Anticoagulation:--  OTHER/APPTS/LABS    Completed by: Maisie Ronnald Acosta PA-C   D/C Meds to be reconciled by nurse after every discharge.    Reviewed by: MD:______ RN_______

## 2023-08-28 NOTE — Progress Notes (Signed)
 Contacted by nephrologist to say pt will d/c today and resume care at [facility] tomorrow. Contacted clinic to be advised of pt's d/c today and that pt should resume care tomorrow.   Randine Mungo Dialysis Navigator [phone]

## 2023-08-28 NOTE — Anesthesia Postprocedure Evaluation (Signed)
 Anesthesia Post Note  Patient: Nathan Castaneda  Procedure(s) Performed: TUNNELED DIALYSIS CATHETER EXCHANGE (Right: Groin)     Patient location during evaluation: PACU Anesthesia Type: MAC Level of consciousness: awake and alert Pain management: pain level controlled Vital Signs Assessment: post-procedure vital signs reviewed and stable Respiratory status: spontaneous breathing, nonlabored ventilation, respiratory function stable and patient connected to nasal cannula oxygen Cardiovascular status: stable and blood pressure returned to baseline Postop Assessment: no apparent nausea or vomiting Anesthetic complications: no   No notable events documented.  Last Vitals:  Vitals:   08/28/23 1136 08/28/23 1145  BP: 123/85 123/88  Pulse: 64 63  Resp: 12 13  Temp:  36.5 C  SpO2: 99% 98%    Last Pain:  Vitals:   08/28/23 1136  TempSrc:   PainSc: 0-No pain                 Thom [name]

## 2023-08-28 NOTE — TOC Transition Note (Signed)
 Transition of Care [facility]) - Discharge Note   Patient Details  Name: Nathan Castaneda MRN: [id number] Date of Birth: 04-Feb-1975  Transition of Care [facility]) CM/SW Contact:  Tom-Johnson, Tayjon Halladay Daphne, RN Phone Number: 08/28/2023, 3:33 PM   Clinical Narrative:     Patient is scheduled for discharge today.  Readmission Risk Assessment done. Patient requested for a cab, Therapist, nutritional and Release of Liability form explained to patient with understanding verbalized, form signed and placed in patient's chart. Cab voucher given to RN. No further TOC needs noted.      Final next level of care: Home/Self Care Barriers to Discharge: Barriers Resolved   Patient Goals and CMS Choice Patient states their goals for this hospitalization and ongoing recovery are:: To return home CMS Medicare.gov Compare Post Acute Care list provided to:: Patient Choice offered to / list presented to : Patient      Discharge Placement                Patient to be transferred to facility by: [facility]      Discharge Plan and Services Additional resources added to the After Visit Summary for                  DME Arranged: N/A DME Agency: NA       HH Arranged: NA HH Agency: NA        Social Drivers of Health (SDOH) Interventions SDOH Screenings   Food Insecurity: No Food Insecurity (08/27/2023)  Housing: Low Risk  (08/27/2023)  Transportation Needs: No Transportation Needs (08/27/2023)  Recent Concern: Transportation Needs - Unmet Transportation Needs (07/06/2023)  Utilities: Not At Risk (08/27/2023)  Financial Resource Strain: Medium Risk (01/04/2021)   Received from [facility]  Social Connections: Unknown (05/11/2021)   Received from [facility]  Stress: Stress Concern Present (01/04/2021)   Received from [facility]  Tobacco Use: High Risk (08/28/2023)     Readmission Risk Interventions    08/28/2023    3:31 PM  Readmission Risk Prevention Plan  Transportation Screening Complete   PCP or Specialist Appt within 5-7 Days Complete  Home Care Screening Complete  Medication Review (RN CM) Referral to Pharmacy

## 2023-08-28 NOTE — Discharge Summary (Signed)
 Physician Discharge Summary  Nathan Castaneda FMW:[id number] DOB: 07/17/1975 DOA: 08/27/2023  PCP: Norine Manuelita LABOR, MD  Admit date: 08/27/2023 Discharge date: 08/28/2023  Admitted From: No  Disposition:  None   Recommendations for Outpatient Follow-up:  Follow up with Renal in 1-2 weeks Will go to his HD outpatient appointment on 8.20.2025  Home Health:No  Equipment/Devices:None   Discharge Condition:Stable  CODE STATUS:Full  Diet recommendation: Heart Healthy  Brief/Interim Summary: 48 y.o. male past medical history significant for end-stage renal disease, anemia essential hypertension presents with displaced tunneled right femoral HD catheter which happened 2 days prior to admission   Discharge Diagnoses:  Principal Problem:   Complication of vascular dialysis catheter Active Problems:   Problem with dialysis access [facility])  Complication of vascular left groin dialysis catheter/ Problem with dialysis access [facility]) Transfer from [facility] as HD catheter got displaced 2 days prior to admission. Vascular surgery consult,  cather exchanged on 8.19.2025   End-stage renal disease on hemodialysis Monday Wednesday and Friday, Renal has been notified, no indication for acute HD Can follow up with HD center on his regular date 8.20.2025   Pancytopenia: Appears to be stable. 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 He has anemia of chronic renal disease. He has had leukopenia since February 2024 which has remained stable since that time. Will need to be referred to oncology as an outpatient.   Chronic abdominal pain: Abdominal exam is benign.   History of lupus: Not on any meds.   Essential hypertension: Not on any meds.    Discharge Instructions  Discharge Instructions     Diet - low sodium heart healthy   Complete by: As directed    Increase activity slowly   Complete by: As directed    No wound care   Complete by: As directed       Allergies as  of 08/28/2023       Reactions   Firvanq  [vancomycin ] Itching        Medication List     TAKE these medications    acetaminophen  500 MG tablet Commonly known as: TYLENOL  Take 1,000 mg by mouth 2 (two) times daily as needed for moderate pain (pain score 4-6), fever or headache.   aspirin  EC 81 MG tablet Take 1 tablet (81 mg total) by mouth daily. Swallow whole.   ferric citrate  1 GM 210 MG(Fe) tablet Commonly known as: AURYXIA  Take 630 mg by mouth in the morning, at noon, and at bedtime.   Lokelma  10 g Pack packet Generic drug: sodium zirconium cyclosilicate  Take 10 g by mouth daily.   ondansetron  4 MG tablet Commonly known as: ZOFRAN  Take 4 mg by mouth every 8 (eight) hours as needed for nausea or vomiting.   pantoprazole  40 MG tablet Commonly known as: PROTONIX  Take 40 mg by mouth daily.        Allergies  Allergen Reactions   Firvanq  [Vancomycin ] Itching    Consultations: Vascular surgery Renal   Procedures/Studies: DG C-Arm 1-60 Min-No Report Result Date: 08/28/2023 Fluoroscopy was utilized by the requesting physician.  No radiographic interpretation.      Subjective: No complains  Discharge Exam: Vitals:   08/28/23 1136 08/28/23 1145  BP: 123/85 123/88  Pulse: 64 63  Resp: 12 13  Temp:  97.7 F (36.5 C)  SpO2: 99% 98%   Vitals:   08/28/23 1121 08/28/23 1130 08/28/23 1136 08/28/23 1145  BP: 118/83 123/88 123/85 123/88  Pulse: 75 68 64 63  Resp: 13 15  12 13  Temp: 97.7 F (36.5 C)   97.7 F (36.5 C)  TempSrc:      SpO2: 100% 99% 99% 98%  Weight:      Height:        General: Pt is alert, awake, not in acute distress Cardiovascular: RRR, S1/S2 +, no rubs, no gallops Respiratory: CTA bilaterally, no wheezing, no rhonchi Abdominal: Soft, NT, ND, bowel sounds + Extremities: no edema, no cyanosis    The results of significant diagnostics from this hospitalization (including imaging, microbiology, ancillary and laboratory) are listed  below for reference.     Microbiology: Recent Results (from the past 240 hours)  MRSA Next Gen by PCR, Nasal     Status: None   Collection Time: 08/28/23 12:15 AM   Specimen: Nasal Mucosa; Nasal Swab  Result Value Ref Range Status   MRSA by PCR Next Gen NOT DETECTED NOT DETECTED Final    Comment: (NOTE) The GeneXpert MRSA Assay (FDA approved for NASAL specimens only), is one component of a comprehensive MRSA colonization surveillance program. It is not intended to diagnose MRSA infection nor to guide or monitor treatment for MRSA infections. Test performance is not FDA approved in patients less than 13 years old. Performed at [facility], [address] [place], [place] [address]      Labs: BNP (last 3 results) No results for input(s): BNP in the last 8760 hours. Basic Metabolic Panel: Recent Labs  Lab 08/27/23 2049 08/28/23 0520  NA  --  144  K  --  4.2  CL  --  100  CO2  --  26  GLUCOSE  --  84  BUN  --  41*  CREATININE 15.83* 16.26*  CALCIUM  --  9.0  MG  --  1.9  PHOS  --  5.3*   Liver Function Tests: Recent Labs  Lab 08/28/23 0520  AST 9*  ALT 5  ALKPHOS 65  BILITOT 0.6  PROT 6.5  ALBUMIN 2.7*   No results for input(s): LIPASE, AMYLASE in the last 168 hours. No results for input(s): AMMONIA in the last 168 hours. CBC: Recent Labs  Lab 08/27/23 2049 08/28/23 0520  WBC 3.8* 3.9*  NEUTROABS  --  2.2  HGB 9.4* 9.3*  HCT 29.2* 28.4*  MCV 81.6 82.1  PLT 135* 143*   Cardiac Enzymes: No results for input(s): CKTOTAL, CKMB, CKMBINDEX, TROPONINI in the last 168 hours. BNP: Invalid input(s): POCBNP CBG: No results for input(s): GLUCAP in the last 168 hours. D-Dimer No results for input(s): DDIMER in the last 72 hours. Hgb A1c No results for input(s): HGBA1C in the last 72 hours. Lipid Profile No results for input(s): CHOL, HDL, LDLCALC, TRIG, CHOLHDL, LDLDIRECT in the last 72 hours. Thyroid function  studies No results for input(s): TSH, T4TOTAL, T3FREE, THYROIDAB in the last 72 hours.  Invalid input(s): FREET3 Anemia work up No results for input(s): VITAMINB12, FOLATE, FERRITIN, TIBC, IRON , RETICCTPCT in the last 72 hours. Urinalysis No results found for: COLORURINE, APPEARANCEUR, LABSPEC, PHURINE, GLUCOSEU, HGBUR, BILIRUBINUR, KETONESUR, PROTEINUR, UROBILINOGEN, NITRITE, LEUKOCYTESUR Sepsis Labs Recent Labs  Lab 08/27/23 2049 08/28/23 0520  WBC 3.8* 3.9*   Microbiology Recent Results (from the past 240 hours)  MRSA Next Gen by PCR, Nasal     Status: None   Collection Time: 08/28/23 12:15 AM   Specimen: Nasal Mucosa; Nasal Swab  Result Value Ref Range Status   MRSA by PCR Next Gen NOT DETECTED NOT DETECTED Final  Comment: (NOTE) The GeneXpert MRSA Assay (FDA approved for NASAL specimens only), is one component of a comprehensive MRSA colonization surveillance program. It is not intended to diagnose MRSA infection nor to guide or monitor treatment for MRSA infections. Test performance is not FDA approved in patients less than 35 years old. Performed at [facility], [address], [place], [place] [address]      Time coordinating discharge: Over 35 minutes  SIGNED:   Erle Odell Castor, MD  [facility] 08/28/2023, 3:17 PM Pager   If 7PM-7AM, please contact night-coverage [email or web address] Password [email or web address]

## 2023-08-28 NOTE — Progress Notes (Signed)
 [facility] PROGRESS NOTE    Progress Note  Nathan Castaneda  FMW:[id number] DOB: [birth year] DOA: 08/27/2023 PCP: Norine Manuelita LABOR, MD     Brief Narrative:   Nathan Castaneda is an 48 y.o. male past medical history significant for end-stage renal disease, anemia essential hypertension presents with displaced tunneled right femoral HD catheter which happened 2 days prior to admission   Assessment/Plan:   Complication of vascular left groin dialysis catheter/ Problem with dialysis access [facility]) Transfer from [facility] as HD catheter got displaced 2 days prior to admission. Vascular surgery has been notified.  End-stage renal disease on hemodialysis Monday Wednesday and Friday, Renal has been notified. Last dialysis was 08/24/2023.  Pancytopenia: Appears to be stable continue to monitor. 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 He has anemia of chronic renal disease. He has had leukopenia since February 2024 which has remained stable since that time. Will need to be referred to oncology as an outpatient.  Chronic abdominal pain: Abdominal exam is benign.  History of lupus: Not on any meds.  Essential hypertension: Not on any meds.   DVT prophylaxis: lovenox Family Communication:none Status is: Inpatient Remains inpatient appropriate because: HD catheter complications    Code Status:     Code Status Orders  (From admission, onward)           Start     Ordered   08/28/23 0509  Full code  Continuous       Question:  By:  Answer:  Consent: discussion documented in EHR   08/28/23 0511           Code Status History     Date Active Date Inactive Code Status Order ID Comments User Context   08/27/2023 1920 08/28/2023 0511 Full Code [id number]  Georgina Basket, MD Inpatient   07/06/2023 1649 07/11/2023 1516 Full Code [id number]  Charlyne Teretha RIGGERS Inpatient   02/21/2022 2344 02/24/2022 0125 Full Code [id number]  Ricky Alfrieda DASEN, DO ED   03/31/2021 0324  04/10/2021 2307 Full Code [id number]  Mansy, Madison LABOR, MD Inpatient   09/12/2019 1224 09/14/2019 2128 Full Code [id number]  Vernon Velna SAUNDERS, MD Inpatient   09/01/2019 2328 09/03/2019 1811 Full Code [id number]  Alfornia Madison, MD ED         IV Access:   Peripheral IV   Procedures and diagnostic studies:   No results found.   Medical Consultants:   None.   Subjective:    Nathan Castaneda no complaints.  Objective:    Vitals:   08/27/23 1830 08/27/23 2053 08/28/23 0329  BP: (!) 159/90 (!) 163/86 (!) 183/99  Pulse: 64 60 (!) 59  Resp: 18 18 18   Temp: 98 F (36.7 C) 98.2 F (36.8 C) 98.5 F (36.9 C)  TempSrc: Oral Oral   SpO2: 100% 100% 100%  Weight: 69 kg    Height: 5' 10 (1.778 m)     SpO2: 100 %  No intake or output data in the 24 hours ending 08/28/23 0630 Filed Weights   08/27/23 1830  Weight: 69 kg    Exam: General exam: In no acute distress. Respiratory system: Good air movement and clear to auscultation. Cardiovascular system: S1 & S2 heard, RRR. No JVD.  Gastrointestinal system: Abdomen is nondistended, soft and nontender.  Extremities: No pedal edema, HD catheter on the left groin Skin: No rashes, lesions or ulcers Psychiatry: Judgement and insight appear normal. Mood & affect appropriate.    Data Reviewed:    Labs: Basic  Metabolic Panel: Recent Labs  Lab 08/27/23 2049  CREATININE 15.83*   GFR Estimated Creatinine Clearance: 5.7 mL/min (A) (by C-G formula based on SCr of 15.83 mg/dL (H)). Liver Function Tests: No results for input(s): AST, ALT, ALKPHOS, BILITOT, PROT, ALBUMIN in the last 168 hours. No results for input(s): LIPASE, AMYLASE in the last 168 hours. No results for input(s): AMMONIA in the last 168 hours. Coagulation profile No results for input(s): INR, PROTIME in the last 168 hours. COVID-19 Labs  No results for input(s): DDIMER, FERRITIN, LDH, CRP in the last 72 hours.  Lab Results  Component  Value Date   SARSCOV2NAA NEGATIVE 03/31/2021   SARSCOV2NAA NEGATIVE 09/26/2019   SARSCOV2NAA NEGATIVE 09/10/2019   SARSCOV2NAA NEGATIVE 09/01/2019    CBC: Recent Labs  Lab 08/27/23 2049 08/28/23 0520  WBC 3.8* 3.9*  NEUTROABS  --  2.2  HGB 9.4* 9.3*  HCT 29.2* 28.4*  MCV 81.6 82.1  PLT 135* 143*   Cardiac Enzymes: No results for input(s): CKTOTAL, CKMB, CKMBINDEX, TROPONINI in the last 168 hours. BNP (last 3 results) No results for input(s): PROBNP in the last 8760 hours. CBG: No results for input(s): GLUCAP in the last 168 hours. D-Dimer: No results for input(s): DDIMER in the last 72 hours. Hgb A1c: No results for input(s): HGBA1C in the last 72 hours. Lipid Profile: No results for input(s): CHOL, HDL, LDLCALC, TRIG, CHOLHDL, LDLDIRECT in the last 72 hours. Thyroid function studies: No results for input(s): TSH, T4TOTAL, T3FREE, THYROIDAB in the last 72 hours.  Invalid input(s): FREET3 Anemia work up: No results for input(s): VITAMINB12, FOLATE, FERRITIN, TIBC, IRON , RETICCTPCT in the last 72 hours. Sepsis Labs: Recent Labs  Lab 08/27/23 2049 08/28/23 0520  WBC 3.8* 3.9*   Microbiology Recent Results (from the past 240 hours)  MRSA Next Gen by PCR, Nasal     Status: None   Collection Time: 08/28/23 12:15 AM   Specimen: Nasal Mucosa; Nasal Swab  Result Value Ref Range Status   MRSA by PCR Next Gen NOT DETECTED NOT DETECTED Final    Comment: (NOTE) The GeneXpert MRSA Assay (FDA approved for NASAL specimens only), is one component of a comprehensive MRSA colonization surveillance program. It is not intended to diagnose MRSA infection nor to guide or monitor treatment for MRSA infections. Test performance is not FDA approved in patients less than 42 years old. Performed at [facility], [address] [place], [place] [address]      Medications:    heparin   5,000 Units Subcutaneous Q8H    pantoprazole   40 mg Oral Daily   polyethylene glycol  17 g Oral Daily   Continuous Infusions:    LOS: 1 day   Nathan Castaneda  [facility]  08/28/2023, 6:30 AM

## 2023-08-28 NOTE — Op Note (Signed)
    NAME: Nathan Castaneda    MRN: [id number] DOB: [birth year]    DATE OF OPERATION: 08/28/2023  PREOP DIAGNOSIS:    End-stage renal disease, dialysis catheter malfunction  POSTOP DIAGNOSIS:    Same  PROCEDURE:    Right common femoral vein tunneled dialysis catheter exchange 55cm palindrome  SURGEON: Fonda FORBES Rim  ASSIST: None  ANESTHESIA: Moderate  EBL: 5 mL  INDICATIONS:    Nathan Castaneda is a 48 y.o. male with end-stage renal disease and multiple upper extremity accesses.  He presents after dislodging his right femoral tunneled dialysis catheter.  FINDINGS:   The felt bumper was not incorporated.  There was concern for infection, therefore I retunneled a new catheter and a new plane.  TECHNIQUE:   Patient was brought to the OR laid in supine position.  General anesthesia was induced the patient was prepped draped in standard fashion.  Upon evaluation of the right sided tunneled dialysis catheter.  The bumper was out of the skin.  It appeared to have a film associated.  I was worried about infection, therefore I elected to make a new tunnel tract.  A counterincision was made in the right groin, and the catheter exposed.  It was transected and held in place with use of a Kelly clamp.  The catheter that was outside the body was taken off the field.  I changed gloves at this point to lower infection risk.  Next, a counterincision was made on the thigh and a new tunnel tract was made to the right groin.  The catheter was pulled through this tunnel track.  The catheter that was in the body was then wired out using Seldinger technique and the new 55cm catheter placed at the cavoatrial junction.  This flushed nicely.    Fonda FORBES Rim, MD [facility] DATE OF DICTATION:   08/28/2023

## 2023-08-28 NOTE — Progress Notes (Signed)
 Formal note to follow   Patient is end-stage renal disease with dislodged right femoral catheter.  Will replace today. Please make n.p.o.  Patient aware.  After discussing risks and benefits, he elects to proceed.   Fonda FORBES Rim MD

## 2023-08-29 ENCOUNTER — Encounter (HOSPITAL_COMMUNITY): Payer: Self-pay | Admitting: Vascular Surgery

## 2023-08-29 ENCOUNTER — Telehealth: Payer: Self-pay | Admitting: Nurse Practitioner

## 2023-08-29 NOTE — Telephone Encounter (Signed)
 Transition of care contact from inpatient facility  Date of Discharge: 08/28/23 Date of Contact: 08/29/23 Method of contact: Phone  Attempted to contact patient to discuss transition of care from inpatient admission. Patient did not answer the phone. Message was left on the patient's voicemail with call back number [phone].

## 2023-09-14 ENCOUNTER — Telehealth: Payer: Self-pay

## 2023-09-14 NOTE — Telephone Encounter (Signed)
 Edit - patient needs TDC exchange in surgery on a Tuesday per note from Dr. Norine.

## 2023-09-14 NOTE — Telephone Encounter (Signed)
 Attempted to call for surgery scheduling. LVM    Dr Norine referring for fistulagram on Tuesday (hospital w/ [facility]).

## 2023-11-15 ENCOUNTER — Encounter (HOSPITAL_COMMUNITY): Admission: RE | Payer: Self-pay | Source: Home / Self Care

## 2023-11-15 ENCOUNTER — Ambulatory Visit (HOSPITAL_COMMUNITY): Admission: RE | Admit: 2023-11-15 | Source: Home / Self Care | Admitting: Vascular Surgery

## 2023-11-15 SURGERY — TUNNELLED CATHETER EXCHANGE
Anesthesia: LOCAL | Site: Thigh | Laterality: Right

## 2023-12-21 IMAGING — DX DG ABD PORTABLE 1V
1 series · 1 of 1 positions shown · non-contrast
Comparison: 04/04/2021

CLINICAL DATA: Abdominal pain

EXAM:
PORTABLE ABDOMEN - 1 VIEW

[abdomen]
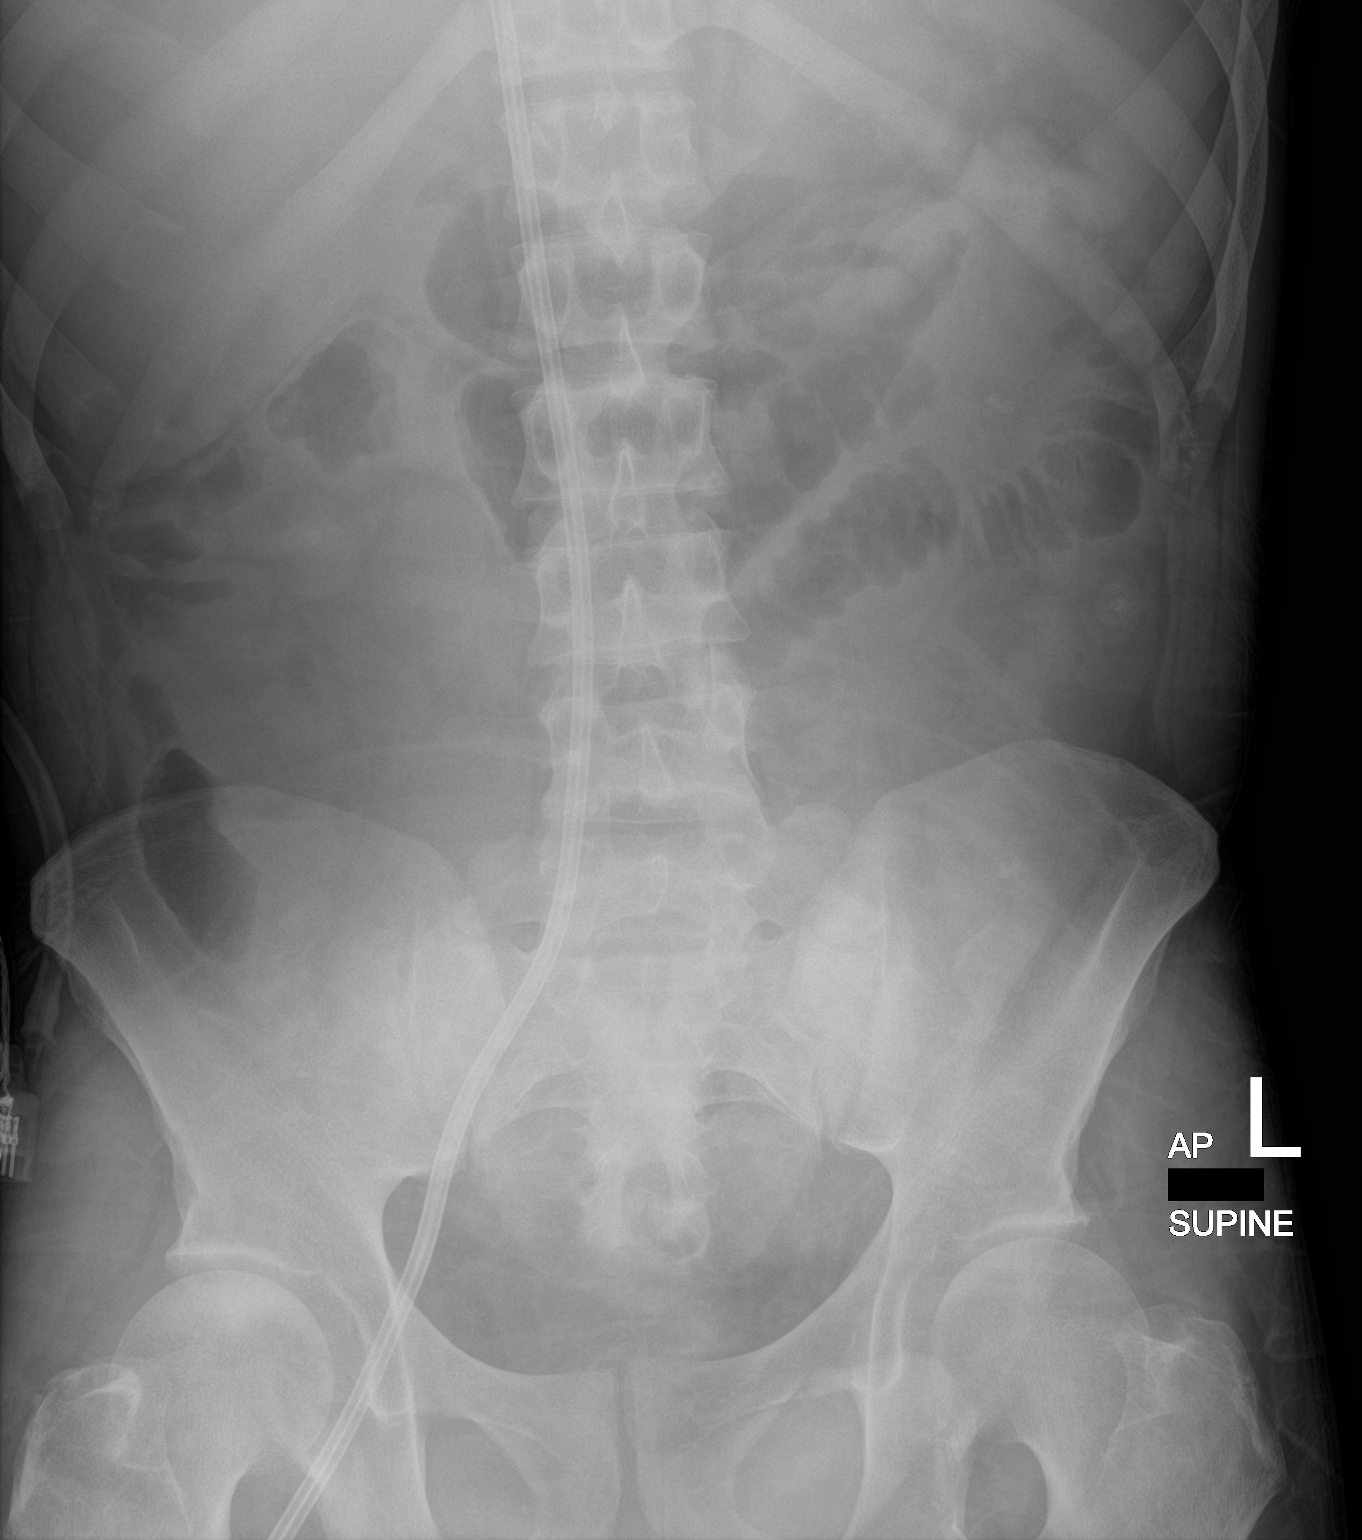

[1 of 1 positions shown; findings below may reference images not displayed]

FINDINGS: Nonobstructive pattern of bowel gas with gas-filled, nondistended
loops of small bowel in the central abdomen and gas present to the
descending colon. No radio-opaque calculi or other significant
radiographic abnormality are seen. Partially imaged large-bore right
femoral multi lumen vascular catheter.
IMPRESSION: Nonobstructive pattern of bowel gas with gas-filled, nondistended
loops of small bowel in the central abdomen and gas present to the
descending colon. No free air on supine radiographs.

## 2023-12-23 IMAGING — CR DG ABDOMEN 1V
2 series · 2 of 2 positions shown · non-contrast
Comparison: Abdominal radiograph dated 04/05/2021.

CLINICAL DATA: Vomiting.  Concern for small bowel obstruction.

EXAM:
ABDOMEN - 1 VIEW

[abdomen kub (1 of 2)]
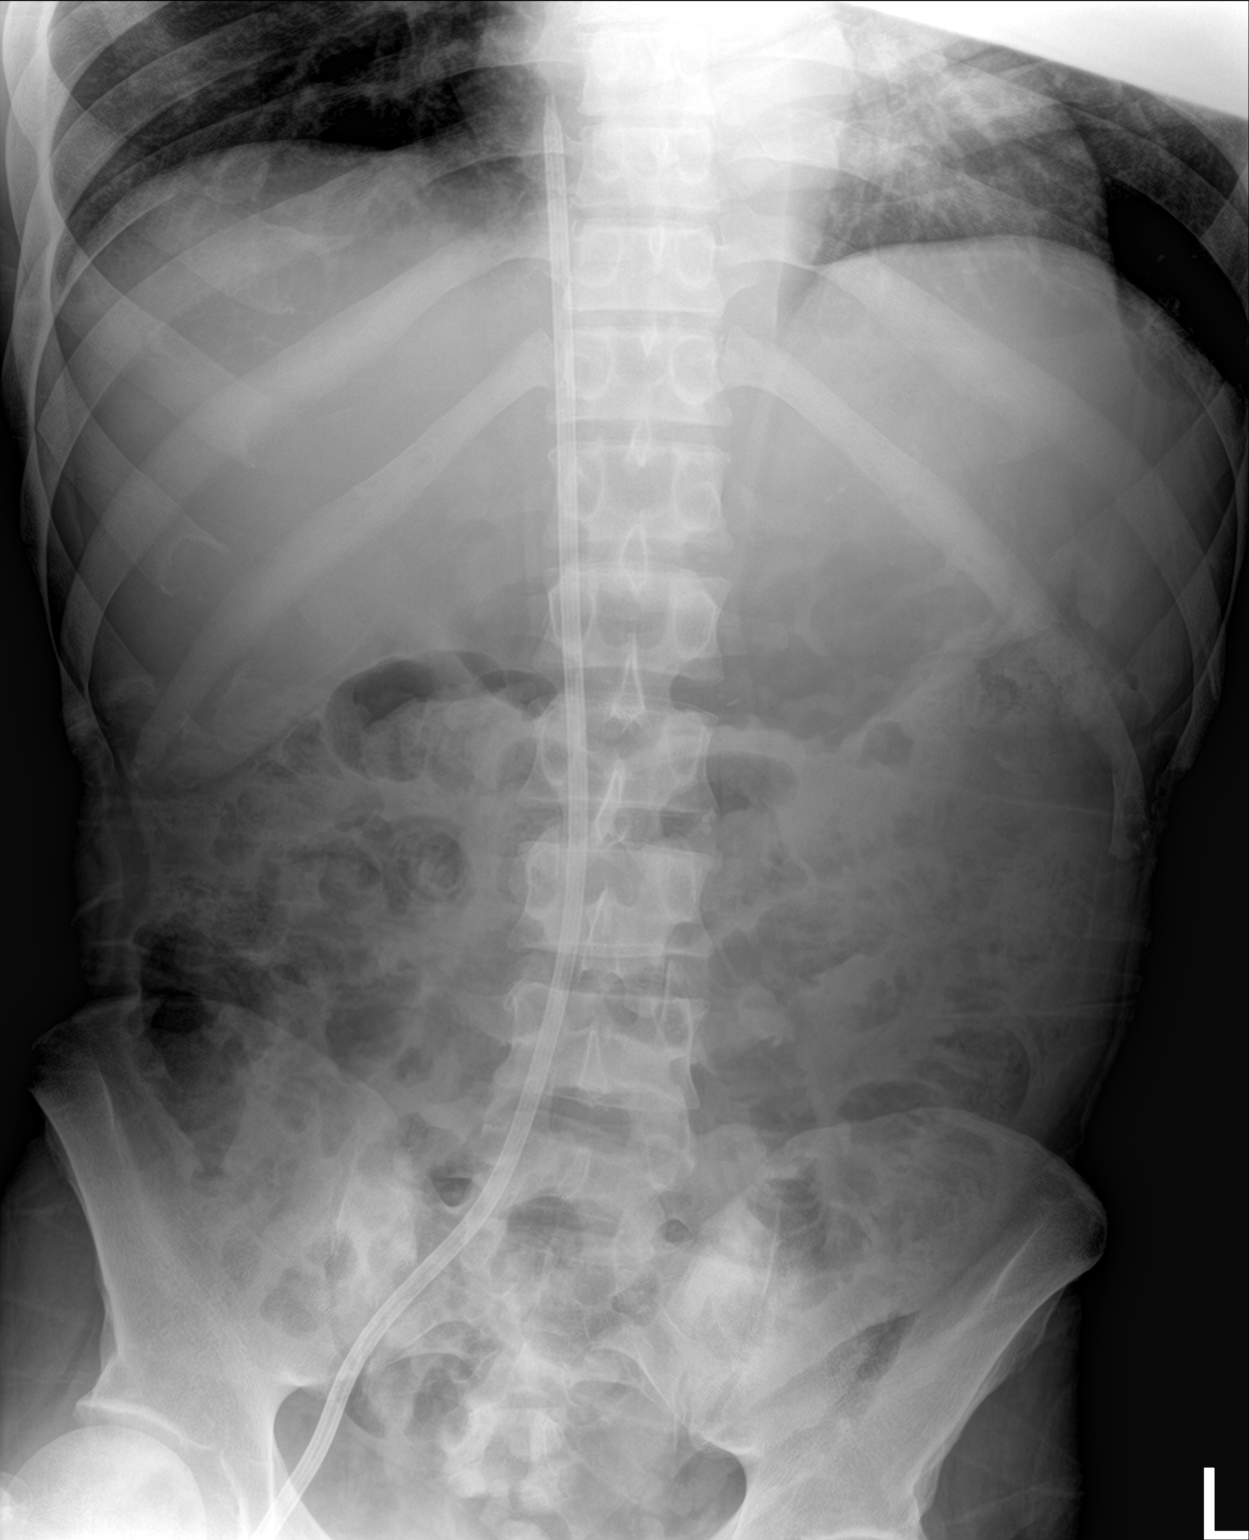

[abdomen kub (2 of 2)]
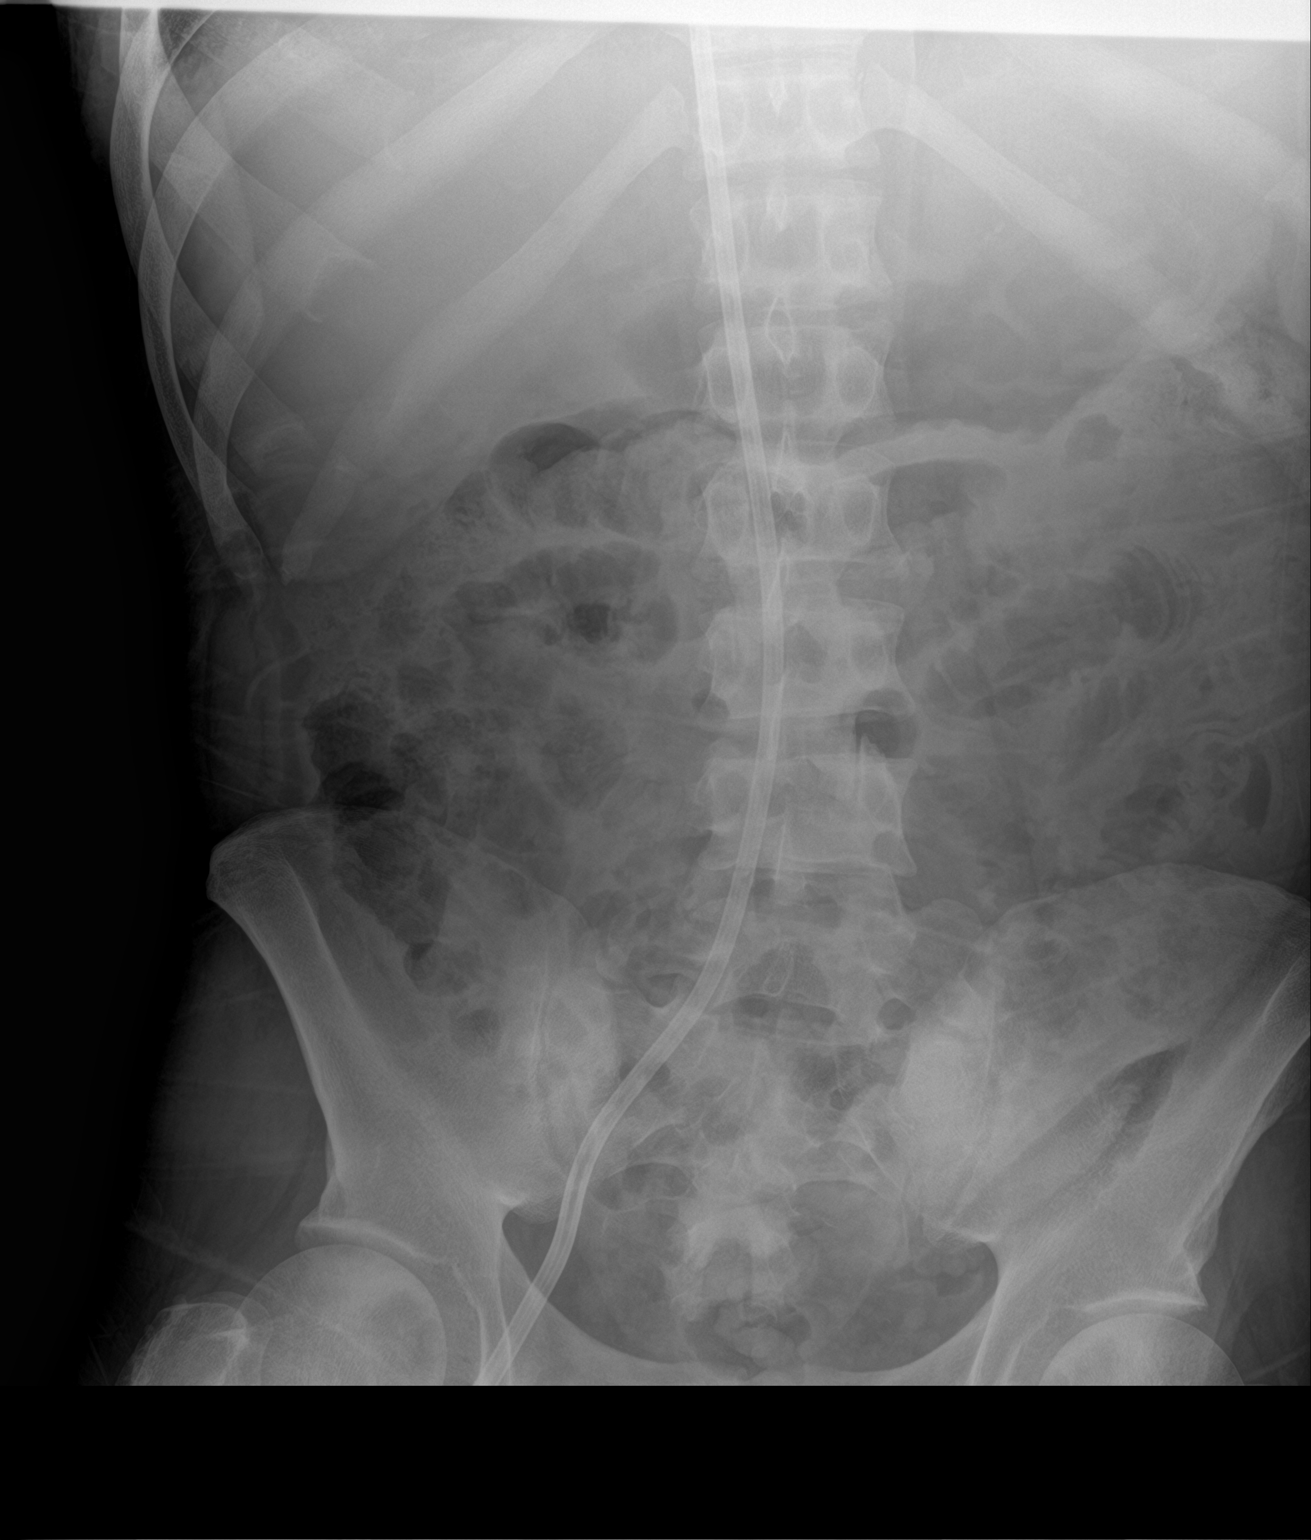

[2 of 2 positions shown; findings below may reference images not displayed]

FINDINGS: Multiple normal caliber air-filled loops of small bowel,
nonspecific. Clinical correlation is recommended to evaluate for
possibility of enteritis. No bowel dilatation or evidence of
obstruction. No free air or radiopaque calculi. The osseous
structures are intact. The soft tissues are unremarkable. Right
femoral approach catheter with tip at the inferior vena and right
atrium junction.
IMPRESSION: No bowel dilatation.

## 2023-12-26 ENCOUNTER — Encounter (HOSPITAL_COMMUNITY): Payer: Self-pay | Admitting: Vascular Surgery

## 2023-12-27 ENCOUNTER — Other Ambulatory Visit: Payer: Self-pay

## 2023-12-27 ENCOUNTER — Ambulatory Visit (HOSPITAL_COMMUNITY)
Admission: RE | Admit: 2023-12-27 | Discharge: 2023-12-27 | Disposition: A | Discharge status: Home / Self Care | Attending: Vascular Surgery | Admitting: Vascular Surgery

## 2023-12-27 ENCOUNTER — Encounter (HOSPITAL_COMMUNITY): Payer: Self-pay | Admitting: Vascular Surgery

## 2023-12-27 ENCOUNTER — Encounter (HOSPITAL_COMMUNITY)
Admission: RE | Disposition: A | Discharge status: Home / Self Care | Payer: Self-pay | Source: Home / Self Care | Attending: Vascular Surgery

## 2023-12-27 DIAGNOSIS — I120 Hypertensive chronic kidney disease with stage 5 chronic kidney disease or end stage renal disease: Secondary | ICD-10-CM | POA: Insufficient documentation

## 2023-12-27 DIAGNOSIS — Y839 Surgical procedure, unspecified as the cause of abnormal reaction of the patient, or of later complication, without mention of misadventure at the time of the procedure: Secondary | ICD-10-CM | POA: Diagnosis not present

## 2023-12-27 DIAGNOSIS — N186 End stage renal disease: Secondary | ICD-10-CM | POA: Diagnosis not present

## 2023-12-27 DIAGNOSIS — F17210 Nicotine dependence, cigarettes, uncomplicated: Secondary | ICD-10-CM | POA: Diagnosis not present

## 2023-12-27 DIAGNOSIS — Z992 Dependence on renal dialysis: Secondary | ICD-10-CM | POA: Diagnosis not present

## 2023-12-27 DIAGNOSIS — T8242XA Displacement of vascular dialysis catheter, initial encounter: Secondary | ICD-10-CM | POA: Insufficient documentation

## 2023-12-27 HISTORY — PX: TUNNELLED CATHETER EXCHANGE: CATH118373

## 2023-12-27 SURGERY — TUNNELLED CATHETER EXCHANGE
Anesthesia: LOCAL | Site: Thigh | Laterality: Right

## 2023-12-27 MED ORDER — HEPARIN SODIUM (PORCINE) 1000 UNIT/ML IJ SOLN
INTRAMUSCULAR | Status: AC
  Filled 2023-12-27: qty 10

## 2023-12-27 MED ORDER — HEPARIN (PORCINE) IN NACL 1000-0.9 UT/500ML-% IV SOLN
INTRAVENOUS | Status: DC | PRN
  Administered 2023-12-27: 09:00:00 500 mL

## 2023-12-27 MED ORDER — LIDOCAINE HCL (PF) 1 % IJ SOLN
INTRAMUSCULAR | Status: AC
  Filled 2023-12-27: qty 30

## 2023-12-27 MED ORDER — HEPARIN SODIUM (PORCINE) 1000 UNIT/ML IJ SOLN
INTRAMUSCULAR | Status: DC | PRN
  Administered 2023-12-27: 09:00:00 4600 [IU] via INTRAVENOUS

## 2023-12-27 MED ORDER — IODIXANOL 320 MG/ML IV SOLN
INTRAVENOUS | Status: DC | PRN
  Administered 2023-12-27: 09:00:00 10 mL

## 2023-12-27 MED ORDER — LIDOCAINE HCL (PF) 1 % IJ SOLN
INTRAMUSCULAR | Status: DC | PRN
  Administered 2023-12-27: 09:00:00 5 mL via SUBCUTANEOUS

## 2023-12-27 SURGICAL SUPPLY — 3 items
CATH PALINDROME 14.5FR 50X33 (CATHETERS) IMPLANT
KIT PV (KITS) ×1 IMPLANT
TRAY PV CATH (CUSTOM PROCEDURE TRAY) ×1 IMPLANT

## 2023-12-27 NOTE — H&P (Signed)
 H+P  History of Present Illness: This is a 48 y.o. male with end-stage renal disease on dialysis via right femoral tunneled dialysis catheter.  He has undergone multiple previous upper extremity accesses and has been considered catheter dependent.  Catheter was recently dislodged and is now falling out of right side he was however able to use this yesterday for dialysis.  He is not having any fevers or chills.  Past Medical History:  Diagnosis Date   Anemia    Anxiety    History of recent blood transfusion    Hypertension    Lupus    Renal disease     Past Surgical History:  Procedure Laterality Date   ARTERY REPAIR Left 07/06/2023   Procedure: PRIMARY END TO END ANASTOMOSIS OF LEFT BRACHIAL ARTERY;  Surgeon: Lanis Fonda BRAVO, MD;  Location: [facility] OR;  Service: Vascular;  Laterality: Left;   AV FISTULA PLACEMENT     ESOPHAGOGASTRODUODENOSCOPY (EGD) WITH PROPOFOL  N/A 04/08/2021   Procedure: ESOPHAGOGASTRODUODENOSCOPY (EGD) WITH PROPOFOL ;  Surgeon: Rollin Dover, MD;  Location: [facility] ENDOSCOPY;  Service: Gastroenterology;  Laterality: N/A;   EXCHANGE OF A DIALYSIS CATHETER Right 07/06/2023   Procedure: EXCHANGE OF RIGHT FEMORAL VEIN DIALYSIS CATHETER;  Surgeon: Lanis Fonda BRAVO, MD;  Location: [facility] OR;  Service: Vascular;  Laterality: Right;   INSERTION OF DIALYSIS CATHETER Right 08/28/2023   Procedure: TUNNELED DIALYSIS CATHETER EXCHANGE;  Surgeon: Pearline Norman RAMAN, MD;  Location: [facility] OR;  Service: Vascular;  Laterality: Right;   IR FLUORO GUIDE CV LINE RIGHT  09/23/2021   IR FLUORO GUIDE CV LINE RIGHT  02/23/2022   PERITONEAL CATHETER INSERTION     REMOVAL OF GRAFT Left 07/06/2023   Procedure: REMOVAL LEFT ARM HERO GRAFT;  Surgeon: Lanis Fonda BRAVO, MD;  Location: [facility] OR;  Service: Vascular;  Laterality: Left;   REVISON OF ARTERIOVENOUS FISTULA Right 09/12/2019   Procedure: 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 artery with reversed saphenous vein from left ankle;   Surgeon: Oris Krystal FALCON, MD;  Location: [facility] OR;  Service: Vascular;  Laterality: Right;   TUNNELLED CATHETER EXCHANGE N/A 07/10/2023   Procedure: TUNNELLED CATHETER EXCHANGE;  Surgeon: Serene Gaile ORN, MD;  Location: [facility] INVASIVE CV LAB;  Service: Cardiovascular;  Laterality: N/A;   VEIN HARVEST Left 09/12/2019   Procedure: SAPHENOUS VEIN HARVEST;  Surgeon: Oris Krystal FALCON, MD;  Location: [facility] OR;  Service: Vascular;  Laterality: Left;   VEIN HARVEST Right 07/06/2023   Procedure: RIGHT GREATER SAPHENECTOMY;  Surgeon: Lanis Fonda BRAVO, MD;  Location: [facility] OR;  Service: Vascular;  Laterality: Right;   WOUND DEBRIDEMENT Right 09/26/2019   Procedure: RIGHT UPPER EXTREMITY WOUND WASHOUT;  Surgeon: Oris Krystal FALCON, MD;  Location: [facility] OR;  Service: Vascular;  Laterality: Right;    Allergies[1]  Prior to Admission medications  Medication Sig Start Date End Date Taking? Authorizing Provider  acetaminophen  (TYLENOL ) 500 MG tablet Take 1,000 mg by mouth 2 (two) times daily as needed for moderate pain (pain score 4-6), fever or headache.    [provider]  aspirin  EC 81 MG tablet Take 1 tablet (81 mg total) by mouth daily. Swallow whole. Patient not taking: Reported on 08/28/2023 07/11/23   Charlyne Reed, PA-C  ferric citrate  (AURYXIA ) 1 GM 210 MG(Fe) tablet Take 630 mg by mouth in the morning, at noon, and at bedtime.    [provider]  LOKELMA  10 g PACK packet Take 10 g by mouth daily.    [provider]  ondansetron  (ZOFRAN ) 4 MG tablet Take 4 mg by mouth every 8 (eight) hours as needed for nausea or vomiting.    [provider]  pantoprazole  (PROTONIX ) 40 MG tablet Take 40 mg by mouth daily.    [provider]    Social History   Socioeconomic History   Marital status: Single    Spouse name: Not on file   Number of children: Not on file   Years of education: 12   Highest education level: Not on file  Occupational History   Occupation: Disabled  Tobacco Use    Smoking status: Every Day    Current packs/day: 0.50    Average packs/day: 0.5 packs/day for 15.0 years (7.5 ttl pk-yrs)    Types: Cigarettes   Smokeless tobacco: Never  Vaping Use   Vaping status: Never Used  Substance and Sexual Activity   Alcohol use: Never   Drug use: Never   Sexual activity: Yes    Birth control/protection: Condom  Other Topics Concern   Not on file  Social History Narrative   Not on file   Social Drivers of Health   Tobacco Use: High Risk (08/28/2023)   Patient History    Smoking Tobacco Use: Every Day    Smokeless Tobacco Use: Never    Passive Exposure: Not on file  Financial Resource Strain: Medium Risk (01/04/2021)   Received from [facility]   Overall Financial Resource Strain (CARDIA)    Difficulty of Paying Living Expenses: Somewhat hard  Food Insecurity: No Food Insecurity (08/27/2023)   Epic    Worried About Radiation Protection Practitioner of Food in the Last Year: Never true    Ran Out of Food in the Last Year: Never true  Transportation Needs: No Transportation Needs (08/27/2023)   Epic    Lack of Transportation (Medical): No    Lack of Transportation (Non-Medical): No  Recent Concern: Transportation Needs - Unmet Transportation Needs (07/06/2023)   Epic    Lack of Transportation (Medical): Yes    Lack of Transportation (Non-Medical): Yes  Physical Activity: Not on file  Stress: Stress Concern Present (01/04/2021)   Received from [facility] of Occupational Health - Occupational Stress Questionnaire    Feeling of Stress : To some extent  Social Connections: Not on file  Intimate Partner Violence: Not At Risk (08/27/2023)   Epic    Fear of Current or Ex-Partner: No    Emotionally Abused: No    Physically Abused: No    Sexually Abused: No  Depression (PHQ2-9): Not on file  Alcohol Screen: Not on file  Housing: Low Risk (08/27/2023)   Epic    Unable to Pay for Housing in the Last Year: No    Number of Times Moved in the Last  Year: 0    Homeless in the Last Year: No  Utilities: Not At Risk (08/27/2023)   Epic    Threatened with loss of utilities: No  Health Literacy: Not on file     No family history on file.  Review of Systems  Constitutional: Negative.   HENT: Negative.    Eyes: Negative.   Respiratory: Negative.    Cardiovascular: Negative.   Gastrointestinal: Negative.   Musculoskeletal:        Dislodged right femoral catheter  Skin: Negative.   Neurological: Negative.   Endo/Heme/Allergies: Negative.   Psychiatric/Behavioral: Negative.        Physical Examination  Vitals:   12/27/23 0734  BP: (!) 174/109  Pulse:  72  Resp: 16  SpO2: 100%     Physical Exam HENT:     Head: Normocephalic.  Eyes:     Pupils: Pupils are equal, round, and reactive to light.  Cardiovascular:     Rate and Rhythm: Normal rate.  Pulmonary:     Effort: Pulmonary effort is normal.  Abdominal:     General: Abdomen is flat.  Musculoskeletal:        General: Normal range of motion.     Cervical back: Normal range of motion.     Comments: Right femoral catheter with dressing in place, no evidence of infection  Skin:    General: Skin is warm.  Neurological:     Mental Status: He is alert.      CBC    Component Value Date/Time   WBC 3.9 (L) 08/28/2023 0520   RBC 3.46 (L) 08/28/2023 0520   HGB 9.3 (L) 08/28/2023 0520   HCT 28.4 (L) 08/28/2023 0520   PLT 143 (L) 08/28/2023 0520   MCV 82.1 08/28/2023 0520   MCH 26.9 08/28/2023 0520   MCHC 32.7 08/28/2023 0520   RDW 17.6 (H) 08/28/2023 0520   LYMPHSABS 1.4 08/28/2023 0520   MONOABS 0.3 08/28/2023 0520   EOSABS 0.1 08/28/2023 0520   BASOSABS 0.0 08/28/2023 0520    BMET    Component Value Date/Time   NA 144 08/28/2023 0520   K 4.2 08/28/2023 0520   CL 100 08/28/2023 0520   CO2 26 08/28/2023 0520   GLUCOSE 84 08/28/2023 0520   BUN 41 (H) 08/28/2023 0520   CREATININE 16.26 (H) 08/28/2023 0520   CALCIUM 9.0 08/28/2023 0520   GFRNONAA 3  (L) 08/28/2023 0520   GFRAA 3 (L) 09/13/2019 0429    COAGS: No results found for: INR, PROTIME  ASSESSMENT/PLAN: This is a 48 y.o. male with end-stage renal disease on dialysis via right femoral tunneled dialysis catheter that has become dislodged.  We discussed his options he has elected to proceed with exchange for a new right femoral catheter.  All questions were answered he demonstrates good understanding consent was signed.  Shaquna Geigle C. Sheree, MD [facility] [place] Office: [phone] Pager: [phone]     [1]  Allergies Allergen Reactions   Firvanq  [Vancomycin ] Itching

## 2023-12-27 NOTE — Op Note (Signed)
° ° °  Patient name: Nathan Castaneda MRN: [id number] DOB: [birth year] Sex: male  12/27/2023 Pre-operative Diagnosis: End-stage renal disease, dislodged right femoral tunneled dialysis catheter Post-operative diagnosis:  Same Surgeon:  Penne BROCKS. Sheree, MD Procedure Performed: 1.  Exchange right femoral tunneled dialysis catheter up to 33 cm palindrome 2.  IVC venogram  Indications: 48 year old male with history of end-stage renal disease without further options for permanent access.  He has been dialyzing via right femoral catheter which has become dislodged.  He is now indicated for replacement.  Findings: 33 cm was placed to the suprarenal IVC and IVC venogram demonstrated patency into the right atrium.   Procedure:  The patient was identified in the holding area and taken to heart and vascular procedure room.  The patient was then placed supine on the table and prepped and draped in the usual sterile fashion.  A time out was called.  The existing catheter was cut in half and a dilator was placed into the existing catheter followed by Glidewire under fluoroscopic guidance into the right atrium.  The existing catheter was then removed and pressure was held.  A new 33 cm catheter was then placed into the suprarenal IVC and central venogram demonstrated patency.  The catheter did flush and withdraw heparinized saline easily and was locked with 2.3 cc of concentrated heparin  both ports.  Catheter was then affixed to the skin after anesthetizing the skin with 1% lidocaine  and a sterile dressing was applied.  He tolerated procedure without any complication.  Contrast: 10cc   Zaria Taha C. Sheree, MD [facility] [place] Office: [phone] Pager: [phone]

## 2024-01-09 ENCOUNTER — Other Ambulatory Visit: Payer: Self-pay

## 2024-01-09 ENCOUNTER — Encounter (HOSPITAL_COMMUNITY): Payer: Self-pay | Admitting: Emergency Medicine

## 2024-01-09 ENCOUNTER — Inpatient Hospital Stay (HOSPITAL_COMMUNITY)
Admission: EM | Admit: 2024-01-09 | Discharge: 2024-01-16 | DRG: 356 | Disposition: A | Discharge status: Home / Self Care | Attending: Internal Medicine | Admitting: Internal Medicine

## 2024-01-09 ENCOUNTER — Emergency Department (HOSPITAL_COMMUNITY)

## 2024-01-09 DIAGNOSIS — Z79899 Other long term (current) drug therapy: Secondary | ICD-10-CM

## 2024-01-09 DIAGNOSIS — N2581 Secondary hyperparathyroidism of renal origin: Secondary | ICD-10-CM | POA: Diagnosis present

## 2024-01-09 DIAGNOSIS — I10 Essential (primary) hypertension: Secondary | ICD-10-CM | POA: Diagnosis present

## 2024-01-09 DIAGNOSIS — G8929 Other chronic pain: Secondary | ICD-10-CM | POA: Diagnosis present

## 2024-01-09 DIAGNOSIS — N186 End stage renal disease: Secondary | ICD-10-CM | POA: Diagnosis present

## 2024-01-09 DIAGNOSIS — R9431 Abnormal electrocardiogram [ECG] [EKG]: Secondary | ICD-10-CM | POA: Diagnosis present

## 2024-01-09 DIAGNOSIS — R109 Unspecified abdominal pain: Principal | ICD-10-CM

## 2024-01-09 DIAGNOSIS — F17210 Nicotine dependence, cigarettes, uncomplicated: Secondary | ICD-10-CM | POA: Diagnosis present

## 2024-01-09 DIAGNOSIS — R933 Abnormal findings on diagnostic imaging of other parts of digestive tract: Secondary | ICD-10-CM

## 2024-01-09 DIAGNOSIS — K660 Peritoneal adhesions (postprocedural) (postinfection): Principal | ICD-10-CM | POA: Diagnosis present

## 2024-01-09 DIAGNOSIS — R1084 Generalized abdominal pain: Secondary | ICD-10-CM | POA: Diagnosis present

## 2024-01-09 DIAGNOSIS — D509 Iron deficiency anemia, unspecified: Secondary | ICD-10-CM | POA: Diagnosis present

## 2024-01-09 DIAGNOSIS — Z862 Personal history of diseases of the blood and blood-forming organs and certain disorders involving the immune mechanism: Secondary | ICD-10-CM

## 2024-01-09 DIAGNOSIS — Z992 Dependence on renal dialysis: Secondary | ICD-10-CM

## 2024-01-09 DIAGNOSIS — Z888 Allergy status to other drugs, medicaments and biological substances status: Secondary | ICD-10-CM

## 2024-01-09 DIAGNOSIS — Z881 Allergy status to other antibiotic agents status: Secondary | ICD-10-CM

## 2024-01-09 DIAGNOSIS — R1010 Upper abdominal pain, unspecified: Secondary | ICD-10-CM

## 2024-01-09 DIAGNOSIS — E876 Hypokalemia: Secondary | ICD-10-CM | POA: Diagnosis present

## 2024-01-09 DIAGNOSIS — K56609 Unspecified intestinal obstruction, unspecified as to partial versus complete obstruction: Secondary | ICD-10-CM

## 2024-01-09 DIAGNOSIS — R112 Nausea with vomiting, unspecified: Secondary | ICD-10-CM | POA: Diagnosis present

## 2024-01-09 DIAGNOSIS — D61818 Other pancytopenia: Secondary | ICD-10-CM | POA: Diagnosis present

## 2024-01-09 DIAGNOSIS — R634 Abnormal weight loss: Secondary | ICD-10-CM

## 2024-01-09 DIAGNOSIS — D631 Anemia in chronic kidney disease: Secondary | ICD-10-CM | POA: Diagnosis present

## 2024-01-09 DIAGNOSIS — I120 Hypertensive chronic kidney disease with stage 5 chronic kidney disease or end stage renal disease: Secondary | ICD-10-CM | POA: Diagnosis present

## 2024-01-09 LAB — CBC WITH DIFFERENTIAL/PLATELET
Abs Immature Granulocytes: 0.01 K/uL (ref 0.00–0.07)
Basophils Absolute: 0 K/uL (ref 0.0–0.1)
Basophils Relative: 0 %
Eosinophils Absolute: 0 K/uL (ref 0.0–0.5)
Eosinophils Relative: 0 %
HCT: 33.4 % — ABNORMAL LOW (ref 39.0–52.0)
Hemoglobin: 10.7 g/dL — ABNORMAL LOW (ref 13.0–17.0)
Immature Granulocytes: 0 %
Lymphocytes Relative: 38 %
Lymphs Abs: 1.3 K/uL (ref 0.7–4.0)
MCH: 24.7 pg — ABNORMAL LOW (ref 26.0–34.0)
MCHC: 32 g/dL (ref 30.0–36.0)
MCV: 77.1 fL — ABNORMAL LOW (ref 80.0–100.0)
Monocytes Absolute: 0.3 K/uL (ref 0.1–1.0)
Monocytes Relative: 10 %
Neutro Abs: 1.7 K/uL (ref 1.7–7.7)
Neutrophils Relative %: 52 %
Platelets: 149 K/uL — ABNORMAL LOW (ref 150–400)
RBC: 4.33 MIL/uL (ref 4.22–5.81)
RDW: 15.9 % — ABNORMAL HIGH (ref 11.5–15.5)
WBC: 3.3 K/uL — ABNORMAL LOW (ref 4.0–10.5)
nRBC: 0 % (ref 0.0–0.2)

## 2024-01-09 LAB — LIPASE, BLOOD: Lipase: 153 U/L — ABNORMAL HIGH (ref 11–51)

## 2024-01-09 LAB — COMPREHENSIVE METABOLIC PANEL WITH GFR
ALT: 6 U/L (ref 0–44)
AST: 16 U/L (ref 15–41)
Albumin: 4.1 g/dL (ref 3.5–5.0)
Alkaline Phosphatase: 79 U/L (ref 38–126)
Anion gap: 17 — ABNORMAL HIGH (ref 5–15)
BUN: 24 mg/dL — ABNORMAL HIGH (ref 6–20)
CO2: 29 mmol/L (ref 22–32)
Calcium: 9.6 mg/dL (ref 8.9–10.3)
Chloride: 94 mmol/L — ABNORMAL LOW (ref 98–111)
Creatinine, Ser: 8.34 mg/dL — ABNORMAL HIGH (ref 0.61–1.24)
GFR, Estimated: 7 mL/min — ABNORMAL LOW
Glucose, Bld: 94 mg/dL (ref 70–99)
Potassium: 2.7 mmol/L — CL (ref 3.5–5.1)
Sodium: 139 mmol/L (ref 135–145)
Total Bilirubin: 0.8 mg/dL (ref 0.0–1.2)
Total Protein: 8.3 g/dL — ABNORMAL HIGH (ref 6.5–8.1)

## 2024-01-09 LAB — MAGNESIUM: Magnesium: 1.9 mg/dL (ref 1.7–2.4)

## 2024-01-09 MED ORDER — HYDROMORPHONE HCL 1 MG/ML IJ SOLN
0.5000 mg | Freq: Once | INTRAMUSCULAR | Status: AC
  Administered 2024-01-09: 0.5 mg via SUBCUTANEOUS
  Filled 2024-01-09: qty 1

## 2024-01-09 MED ORDER — ONDANSETRON HCL 4 MG/2ML IJ SOLN
4.0000 mg | Freq: Once | INTRAMUSCULAR | Status: AC
  Administered 2024-01-09: 4 mg via INTRAVENOUS
  Filled 2024-01-09: qty 2

## 2024-01-09 MED ORDER — LACTATED RINGERS IV BOLUS
500.0000 mL | Freq: Once | INTRAVENOUS | Status: AC
  Administered 2024-01-09: 500 mL via INTRAVENOUS

## 2024-01-09 NOTE — ED Provider Triage Note (Signed)
 Emergency Medicine Provider Triage Evaluation Note  Nathan Castaneda , a 48 y.o. male  was evaluated in triage.  Patient reports that he has previously been diagnosed with a bowel obstruction that they are managing nonoperatively.  Reports that he has had nausea, vomiting, abdominal pain.  Reports that he has not tolerated p.o. in several days.  Reports that it has been 3 days since his most recent bowel movement.  Reports that the pain which was previously intermittent is now constant.  Reports that he does have a history of ESRD on dialysis, no recent missed treatments.  Reports that he does still make urine every day, though it is a small amount.  Reports that he has lost 15 to 20 pounds over the past month which he attributes to p.o. intolerance.  Denies fever, chills, chest pain.  Review of Systems  Positive: Per above Negative: Per above  Physical Exam  Ht 5' 10 (1.778 m)   Wt 69 kg   BMI 21.83 kg/m  Gen:   Awake, uncomfortable appearing, however no distress   Resp:  Normal effort  MSK:   Moves extremities without difficulty  Other:  Abdomen soft, diffusely tender to palpation  Medical Decision Making  Medically screening exam initiated at 10:26 PM.  Appropriate orders placed.  Nathan Castaneda was informed that the remainder of the evaluation will be completed by another provider, this initial triage assessment does not replace that evaluation, and the importance of remaining in the ED until their evaluation is complete.   Nathan Jerilynn RAMAN, MD 01/09/24 2227

## 2024-01-09 NOTE — ED Triage Notes (Signed)
 Pt from home with c/o abd pain x 1 month, with recent bowel obstruction- without surgery. Pt has been having N/V after any PO intake. Dialysis MWF and reports that he has not missed any treatments.   BP 178/94 HR 89 Spo2 94% RA CBG 138

## 2024-01-10 DIAGNOSIS — R112 Nausea with vomiting, unspecified: Secondary | ICD-10-CM | POA: Diagnosis not present

## 2024-01-10 DIAGNOSIS — E876 Hypokalemia: Secondary | ICD-10-CM

## 2024-01-10 DIAGNOSIS — R1084 Generalized abdominal pain: Secondary | ICD-10-CM | POA: Diagnosis not present

## 2024-01-10 DIAGNOSIS — Z992 Dependence on renal dialysis: Secondary | ICD-10-CM | POA: Diagnosis not present

## 2024-01-10 DIAGNOSIS — N189 Chronic kidney disease, unspecified: Secondary | ICD-10-CM | POA: Diagnosis not present

## 2024-01-10 DIAGNOSIS — N186 End stage renal disease: Secondary | ICD-10-CM | POA: Diagnosis not present

## 2024-01-10 DIAGNOSIS — R9431 Abnormal electrocardiogram [ECG] [EKG]: Secondary | ICD-10-CM

## 2024-01-10 DIAGNOSIS — Z862 Personal history of diseases of the blood and blood-forming organs and certain disorders involving the immune mechanism: Secondary | ICD-10-CM | POA: Diagnosis not present

## 2024-01-10 DIAGNOSIS — D61818 Other pancytopenia: Secondary | ICD-10-CM | POA: Diagnosis not present

## 2024-01-10 DIAGNOSIS — I10 Essential (primary) hypertension: Secondary | ICD-10-CM | POA: Diagnosis not present

## 2024-01-10 LAB — URINALYSIS, W/ REFLEX TO CULTURE (INFECTION SUSPECTED)
Bilirubin Urine: NEGATIVE
Glucose, UA: NEGATIVE mg/dL
Ketones, ur: NEGATIVE mg/dL
Nitrite: NEGATIVE
Protein, ur: >=300 mg/dL — AB
RBC / HPF: >50 RBC/hpf (ref 0–5)
Specific Gravity, Urine: 1.022 (ref 1.005–1.030)
WBC, UA: >50 WBC/hpf (ref 0–5)
pH: 7 (ref 5.0–8.0)

## 2024-01-10 MED ORDER — DIATRIZOATE MEGLUMINE & SODIUM 66-10 % PO SOLN
40.0000 mL | Freq: Once | ORAL | Status: AC
  Administered 2024-01-10: 40 mL via NASOGASTRIC

## 2024-01-10 MED ORDER — NALOXONE HCL 0.4 MG/ML IJ SOLN: 0.4000 mg | INTRAMUSCULAR | Status: DC | PRN

## 2024-01-10 MED ORDER — HYDRALAZINE HCL 20 MG/ML IJ SOLN
10.0000 mg | INTRAMUSCULAR | Status: DC | PRN
  Administered 2024-01-11: 10 mg via INTRAVENOUS
  Filled 2024-01-10: qty 1

## 2024-01-10 MED ORDER — POTASSIUM CHLORIDE 10 MEQ/100ML IV SOLN: 10.0000 meq | INTRAVENOUS | Status: DC

## 2024-01-10 MED ORDER — ACETAMINOPHEN 325 MG PO TABS: 650.0000 mg | ORAL_TABLET | Freq: Four times a day (QID) | ORAL | Status: DC | PRN

## 2024-01-10 MED ORDER — PANTOPRAZOLE SODIUM 40 MG IV SOLR
40.0000 mg | Freq: Two times a day (BID) | INTRAVENOUS | Status: DC
  Administered 2024-01-10 – 2024-01-16 (×12): 40 mg via INTRAVENOUS
  Filled 2024-01-10 (×12): qty 10

## 2024-01-10 MED ORDER — LORAZEPAM 2 MG/ML IJ SOLN
0.5000 mg | INTRAMUSCULAR | Status: DC | PRN
  Administered 2024-01-10: 0.5 mg via INTRAVENOUS
  Filled 2024-01-10: qty 1

## 2024-01-10 MED ORDER — POTASSIUM CHLORIDE 10 MEQ/100ML IV SOLN
10.0000 meq | INTRAVENOUS | Status: AC
  Administered 2024-01-10: 10 meq via INTRAVENOUS
  Filled 2024-01-10: qty 100

## 2024-01-10 MED ORDER — POTASSIUM CHLORIDE 10 MEQ/100ML IV SOLN
10.0000 meq | INTRAVENOUS | Status: DC
  Administered 2024-01-10: 10 meq via INTRAVENOUS
  Filled 2024-01-10: qty 100

## 2024-01-10 MED ORDER — ACETAMINOPHEN 650 MG RE SUPP: 650.0000 mg | Freq: Four times a day (QID) | RECTAL | Status: DC | PRN

## 2024-01-10 MED ORDER — HYDROMORPHONE HCL 1 MG/ML IJ SOLN
0.5000 mg | Freq: Once | INTRAMUSCULAR | Status: AC
  Administered 2024-01-10: 0.5 mg via SUBCUTANEOUS
  Filled 2024-01-10: qty 1

## 2024-01-10 MED ORDER — HYDROMORPHONE HCL 1 MG/ML IJ SOLN
0.5000 mg | INTRAMUSCULAR | Status: DC | PRN
  Administered 2024-01-10 – 2024-01-16 (×20): 0.5 mg via INTRAVENOUS
  Filled 2024-01-10 (×12): qty 0.5
  Filled 2024-01-10: qty 1
  Filled 2024-01-10 (×5): qty 0.5

## 2024-01-10 NOTE — ED Notes (Signed)
 Pt states he makes very little urine UA unable to be obtained at this time

## 2024-01-10 NOTE — Consult Note (Signed)
 Reason for Consult:ab pain Referring Physician: Dr Pickering  Nathan Castaneda is an 49 y.o. male.  HPI: 72 yom with prior pd catheter and did PD for one year.  This was stopped due to peritonitis.  He has had a sbo before. He is here today for ab pain. This pain has been present for months. It has gotten worse in the last month. He has had n/v for past month.  He has epigastric pain five minutes after eating. He is passing flatus as well as bowel movements (last two days ago).  He has undergone labs here that show K 2.7, lipase 153, wbc of 3.3, and a ct that shows some mild dilated sb in the right abdomen that the report states can be internal hernia. Although this looks similar to prior. No real evidence of obstruction.  We were asked to see him.    Past Medical History:  Diagnosis Date   Anemia    Anxiety    History of recent blood transfusion    Hypertension    Lupus    Renal disease     Past Surgical History:  Procedure Laterality Date   ARTERY REPAIR Left 07/06/2023   Procedure: PRIMARY END TO END ANASTOMOSIS OF LEFT BRACHIAL ARTERY;  Surgeon: Lanis Fonda BRAVO, MD;  Location: [facility] OR;  Service: Vascular;  Laterality: Left;   AV FISTULA PLACEMENT     ESOPHAGOGASTRODUODENOSCOPY (EGD) WITH PROPOFOL  N/A 04/08/2021   Procedure: ESOPHAGOGASTRODUODENOSCOPY (EGD) WITH PROPOFOL ;  Surgeon: Rollin Dover, MD;  Location: [facility] ENDOSCOPY;  Service: Gastroenterology;  Laterality: N/A;   EXCHANGE OF A DIALYSIS CATHETER Right 07/06/2023   Procedure: EXCHANGE OF RIGHT FEMORAL VEIN DIALYSIS CATHETER;  Surgeon: Lanis Fonda BRAVO, MD;  Location: [facility] OR;  Service: Vascular;  Laterality: Right;   INSERTION OF DIALYSIS CATHETER Right 08/28/2023   Procedure: TUNNELED DIALYSIS CATHETER EXCHANGE;  Surgeon: Pearline Norman RAMAN, MD;  Location: [facility] OR;  Service: Vascular;  Laterality: Right;   IR FLUORO GUIDE CV LINE RIGHT  09/23/2021   IR FLUORO GUIDE CV LINE RIGHT  02/23/2022   PERITONEAL CATHETER INSERTION     REMOVAL  OF GRAFT Left 07/06/2023   Procedure: REMOVAL LEFT ARM HERO GRAFT;  Surgeon: Lanis Fonda BRAVO, MD;  Location: [facility] OR;  Service: Vascular;  Laterality: Left;   REVISON OF ARTERIOVENOUS FISTULA Right 09/12/2019   Procedure: 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 artery with reversed saphenous vein from left ankle;  Surgeon: Oris Krystal FALCON, MD;  Location: [facility] OR;  Service: Vascular;  Laterality: Right;   TUNNELLED CATHETER EXCHANGE N/A 07/10/2023   Procedure: TUNNELLED CATHETER EXCHANGE;  Surgeon: Serene Gaile ORN, MD;  Location: [facility] INVASIVE CV LAB;  Service: Cardiovascular;  Laterality: N/A;   TUNNELLED CATHETER EXCHANGE Right 12/27/2023   Procedure: TUNNELLED CATHETER EXCHANGE;  Surgeon: Sheree Penne Bruckner, MD;  Location: [facility] LAB;  Service: Cardiovascular;  Laterality: Right;   VEIN HARVEST Left 09/12/2019   Procedure: SAPHENOUS VEIN HARVEST;  Surgeon: Oris Krystal FALCON, MD;  Location: [facility] OR;  Service: Vascular;  Laterality: Left;   VEIN HARVEST Right 07/06/2023   Procedure: RIGHT GREATER SAPHENECTOMY;  Surgeon: Lanis Fonda BRAVO, MD;  Location: [facility] OR;  Service: Vascular;  Laterality: Right;   WOUND DEBRIDEMENT Right 09/26/2019   Procedure: RIGHT UPPER EXTREMITY WOUND WASHOUT;  Surgeon: Oris Krystal FALCON, MD;  Location: [facility] OR;  Service: Vascular;  Laterality: Right;    History reviewed. No pertinent family history.  Social History:  reports that  he has been smoking cigarettes. He has a 7.5 pack-year smoking history. He has never used smokeless tobacco. He reports that he does not drink alcohol and does not use drugs.  Allergies: Allergies[1]  Medications: Prior to Admission: (Not in a hospital admission)   Results for orders placed or performed during the hospital encounter of 01/09/24 (from the past 48 hours)  Urinalysis, w/ Reflex to Culture (Infection Suspected) -Urine, Clean Catch     Status: Abnormal   Collection Time: 01/09/24 12:24 PM  Result Value Ref Range    Specimen Source URINE, CLEAN CATCH    Color, Urine YELLOW YELLOW   APPearance TURBID (A) CLEAR   Specific Gravity, Urine 1.022 1.005 - 1.030   pH 7.0 5.0 - 8.0   Glucose, UA NEGATIVE NEGATIVE mg/dL   Hgb urine dipstick SMALL (A) NEGATIVE   Bilirubin Urine NEGATIVE NEGATIVE   Ketones, ur NEGATIVE NEGATIVE mg/dL   Protein, ur >=699 (A) NEGATIVE mg/dL   Nitrite NEGATIVE NEGATIVE   Leukocytes,Ua SMALL (A) NEGATIVE   RBC / HPF >50 0 - 5 RBC/hpf   WBC, UA >50 0 - 5 WBC/hpf    Comment:        Reflex urine culture not performed if WBC <=10, OR if Squamous epithelial cells >5. If Squamous epithelial cells >5 suggest recollection.    Bacteria, UA MANY (A) NONE SEEN   Squamous Epithelial / HPF 6-10 0 - 5 /HPF   Hyaline Casts, UA PRESENT    Non Squamous Epithelial 0-5 (A) NONE SEEN    Comment: Performed at [facility], [address] [place], [place] [address]  CBC with Differential     Status: Abnormal   Collection Time: 01/09/24 10:39 PM  Result Value Ref Range   WBC 3.3 (L) 4.0 - 10.5 K/uL   RBC 4.33 4.22 - 5.81 MIL/uL   Hemoglobin 10.7 (L) 13.0 - 17.0 g/dL   HCT 66.5 (L) 60.9 - 47.9 %   MCV 77.1 (L) 80.0 - 100.0 fL   MCH 24.7 (L) 26.0 - 34.0 pg   MCHC 32.0 30.0 - 36.0 g/dL   RDW 84.0 (H) 88.4 - 84.4 %   Platelets 149 (L) 150 - 400 K/uL   nRBC 0.0 0.0 - 0.2 %   Neutrophils Relative % 52 %   Neutro Abs 1.7 1.7 - 7.7 K/uL   Lymphocytes Relative 38 %   Lymphs Abs 1.3 0.7 - 4.0 K/uL   Monocytes Relative 10 %   Monocytes Absolute 0.3 0.1 - 1.0 K/uL   Eosinophils Relative 0 %   Eosinophils Absolute 0.0 0.0 - 0.5 K/uL   Basophils Relative 0 %   Basophils Absolute 0.0 0.0 - 0.1 K/uL   Immature Granulocytes 0 %   Abs Immature Granulocytes 0.01 0.00 - 0.07 K/uL    Comment: Performed at [facility], [address], [place], [place] [address]  Comprehensive metabolic panel     Status: Abnormal   Collection Time: 01/09/24 10:39 PM  Result Value Ref Range   Sodium 139  135 - 145 mmol/L   Potassium 2.7 (LL) 3.5 - 5.1 mmol/L    Comment: Critical Value, Read Back and verified with Toothman, B. RN at 2335 01/09/24 [place]   Chloride 94 (L) 98 - 111 mmol/L   CO2 29 22 - 32 mmol/L   Glucose, Bld 94 70 - 99 mg/dL    Comment: Glucose reference range applies only to samples taken after fasting for at least 8  hours.   BUN 24 (H) 6 - 20 mg/dL   Creatinine, Ser 1.65 (H) 0.61 - 1.24 mg/dL   Calcium 9.6 8.9 - 89.6 mg/dL   Total Protein 8.3 (H) 6.5 - 8.1 g/dL   Albumin 4.1 3.5 - 5.0 g/dL   AST 16 15 - 41 U/L   ALT 6 0 - 44 U/L   Alkaline Phosphatase 79 38 - 126 U/L   Total Bilirubin 0.8 0.0 - 1.2 mg/dL   GFR, Estimated 7 (L) >60 mL/min    Comment: (NOTE) Calculated using the CKD-EPI Creatinine Equation (2021)    Anion gap 17 (H) 5 - 15    Comment: Performed at [facility], [address], [place], [place] [address]  Magnesium      Status: None   Collection Time: 01/09/24 10:39 PM  Result Value Ref Range   Magnesium  1.9 1.7 - 2.4 mg/dL    Comment: Performed at [facility], [address], [place], [place] [address]  Lipase, blood     Status: Abnormal   Collection Time: 01/09/24 10:39 PM  Result Value Ref Range   Lipase 153 (H) 11 - 51 U/L    Comment: Performed at [facility], [address] [place], [place] [address]    CT ABDOMEN PELVIS WO CONTRAST Result Date: 01/09/2024 EXAM: CT ABDOMEN AND PELVIS WITHOUT CONTRAST 01/09/2024 10:48:00 PM TECHNIQUE: CT of the abdomen and pelvis was performed without the administration of intravenous contrast. Multiplanar reformatted images are provided for review. Automated exposure control, iterative reconstruction, and/or weight-based adjustment of the mA/kV was utilized to reduce the radiation dose to as low as reasonably achievable. COMPARISON: CT abdomen and pelvis 08/24/2023. CLINICAL HISTORY: Abdominal pain, acute, nonlocalized. FINDINGS: LOWER CHEST: No acute abnormality. LIVER: The liver is  unremarkable. GALLBLADDER AND BILE DUCTS: Gallbladder is unremarkable. No biliary ductal dilatation. SPLEEN: No acute abnormality. PANCREAS: No acute abnormality. ADRENAL GLANDS: No acute abnormality. KIDNEYS, URETERS AND BLADDER: Bilateral renal atrophy is again noted. There are numerous cysts and cysts with calcifications similar to the prior study. The largest is in the right kidney measuring 3.1 cm. There are additional mildly hyperdense areas in both kidneys which appear similar to prior. There is no hydronephrosis. No perinephric or periureteral stranding. The bladder is decompressed. GI AND BOWEL: Stomach demonstrates no acute abnormality. The appendix is not definitively seen. There is a cluster of dilated small bowel in the right mid abdomen with surrounding inflammatory stranding and mesenteric edema. These bowel loops have air fluid levels. Otherwise, small bowel loops and colon are nondilated. There is no pneumatosis. PERITONEUM AND RETROPERITONEUM: No ascites. No free air. VASCULATURE: Atherosclerotic calcifications throughout the aorta. Right femoral venous catheter tip ends in the IVC. LYMPH NODES: No lymphadenopathy. REPRODUCTIVE ORGANS: No acute abnormality. 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 Bypass graft partially visualized in the anterior left thigh. No acute osseous abnormality. No focal soft tissue abnormality. IMPRESSION: 1. Cluster of dilated small bowel in the right mid abdomen with surrounding inflammatory stranding and mesenteric edema. This appears similar to the prior study and is most likely related to internal hernia. No bowel obstruction. 2. Bilateral renal atrophy with numerous renal cysts, some calcified, similar to the prior study. No hydronephrosis. Mildly hyperdense renal lesions are also stable. . Prominent Electronically signed by: Greig Pique MD 01/09/2024 11:31 PM EST RP Workstation: [id number]     Review of Systems  Constitutional:  Negative for fever.  Gastrointestinal:  Positive for abdominal pain, constipation and nausea.  All other systems reviewed and are negative.  Blood pressure (!) 147/97, pulse (!) 59, temperature (!) 97.5 F (36.4 C), resp. rate 13, height 5' 10 (1.778 m), weight 69 kg, SpO2 98%. Physical Exam Vitals reviewed.  Constitutional:      Appearance: He is well-developed.  Eyes:     General: No scleral icterus. Cardiovascular:     Rate and Rhythm: Normal rate and regular rhythm.  Pulmonary:     Effort: Pulmonary effort is normal.  Abdominal:     General: There is no distension.     Palpations: Abdomen is soft.     Tenderness: There is no abdominal tenderness.     Hernia: No hernia is present.  Skin:    General: Skin is warm and dry.     Capillary Refill: Capillary refill takes less than 2 seconds.  Neurological:     General: No focal deficit present.     Mental Status: He is alert.  Psychiatric:        Mood and Affect: Mood is anxious.     Assessment/Plan: Abdominal pain -he is afraid to eat now as he feels nausea very shortly after eating. -his abdomen is benign and time course certainly argues against an general surgery emergency.   -I think the best plan is for admission by medicine- would consider GI eval for possible ulcer disease, obstruction. Can also consider a CTA for this to evaluate vasculature -I will give him 40 cc of gastrograffin orally to see if this traverses to his colon -will follow with you  I reviewed last 24 h vitals and pain scores, last 48 h intake and output, last 24 h labs and trends, and last 24 h imaging results.     Donnice Bury 01/10/2024, 5:24 PM         [1]  Allergies Allergen Reactions   Firvanq  [Vancomycin ] Itching

## 2024-01-10 NOTE — ED Provider Notes (Signed)
 " [place] EMERGENCY DEPARTMENT AT [facility] Provider Note   CSN: [id number] Arrival date & time: 01/09/24  2220     Patient presents with: Abdominal Pain, Emesis, and Nausea  HPI Nathan Castaneda is a 49 y.o. male with ESRD, CHF, Crohn's, Lupus, also reports recent diagnosis of small bowel obstruction presenting for abdominal pain and vomiting.  Symptoms have been going on for about a month.  Pain is all over the abdomen.  He states he cannot keep anything down.  He states every time he eats he throws it right back up.  Had a normal bowel movement 3 days ago and is still passing gas.  Denies urinary symptoms.  Last dialysis was Tuesday.  Denies chest pain or shortness of breath.    Abdominal Pain Associated symptoms: vomiting   Emesis Associated symptoms: abdominal pain        Prior to Admission medications  Medication Sig Start Date End Date Taking? Authorizing Provider  acetaminophen  (TYLENOL ) 500 MG tablet Take 1,000 mg by mouth 2 (two) times daily as needed for moderate pain (pain score 4-6), fever or headache.   Yes [provider]  ferric citrate  (AURYXIA ) 1 GM 210 MG(Fe) tablet Take 630 mg by mouth in the morning, at noon, and at bedtime.   Yes [provider]  LOKELMA  10 g PACK packet Take 10 g by mouth daily.   Yes [provider]  pantoprazole  (PROTONIX ) 40 MG tablet Take 40 mg by mouth daily.   Yes [provider]  sevelamer carbonate (RENVELA) 800 MG tablet 1,600 mg 3 (three) times daily with meals. And snacks 12/18/23 12/17/24 Yes [provider]    Allergies: Firvanq  [vancomycin ]    Review of Systems  Gastrointestinal:  Positive for abdominal pain and vomiting.    Updated Vital Signs BP (!) 151/90   Pulse 76   Temp 97.8 F (36.6 C) (Oral)   Resp 20   Ht 5' 10 (1.778 m)   Wt 69 kg   SpO2 96%   BMI 21.83 kg/m   Physical Exam Vitals and nursing note reviewed.  HENT:     Head: Normocephalic and  atraumatic.     Mouth/Throat:     Mouth: Mucous membranes are moist.  Eyes:     General:        Right eye: No discharge.        Left eye: No discharge.     Conjunctiva/sclera: Conjunctivae normal.  Cardiovascular:     Rate and Rhythm: Normal rate and regular rhythm.     Pulses: Normal pulses.     Heart sounds: Normal heart sounds.  Pulmonary:     Effort: Pulmonary effort is normal.     Breath sounds: Normal breath sounds.  Abdominal:     General: Abdomen is flat.     Palpations: Abdomen is soft.     Tenderness: There is generalized abdominal tenderness.  Skin:    General: Skin is warm and dry.  Neurological:     General: No focal deficit present.  Psychiatric:        Mood and Affect: Mood normal.     (all labs ordered are listed, but only abnormal results are displayed) Labs Reviewed  CBC WITH DIFFERENTIAL/PLATELET - Abnormal; Notable for the following components:      Result Value   WBC 3.3 (*)    Hemoglobin 10.7 (*)    HCT 33.4 (*)    MCV 77.1 (*)    [facility]  24.7 (*)    RDW 15.9 (*)    Platelets 149 (*)    All other components within normal limits  COMPREHENSIVE METABOLIC PANEL WITH GFR - Abnormal; Notable for the following components:   Potassium 2.7 (*)    Chloride 94 (*)    BUN 24 (*)    Creatinine, Ser 8.34 (*)    Total Protein 8.3 (*)    GFR, Estimated 7 (*)    Anion gap 17 (*)    All other components within normal limits  LIPASE, BLOOD - Abnormal; Notable for the following components:   Lipase 153 (*)    All other components within normal limits  URINALYSIS, W/ REFLEX TO CULTURE (INFECTION SUSPECTED) - Abnormal; Notable for the following components:   APPearance TURBID (*)    Hgb urine dipstick SMALL (*)    Protein, ur >=300 (*)    Leukocytes,Ua SMALL (*)    Bacteria, UA MANY (*)    Non Squamous Epithelial 0-5 (*)    All other components within normal limits  MAGNESIUM   CBC WITH DIFFERENTIAL/PLATELET  COMPREHENSIVE METABOLIC PANEL WITH GFR  MAGNESIUM    MAGNESIUM   PHOSPHORUS    EKG: None  Radiology: CT ABDOMEN PELVIS WO CONTRAST Result Date: 01/09/2024 EXAM: CT ABDOMEN AND PELVIS WITHOUT CONTRAST 01/09/2024 10:48:00 PM TECHNIQUE: CT of the abdomen and pelvis was performed without the administration of intravenous contrast. Multiplanar reformatted images are provided for review. Automated exposure control, iterative reconstruction, and/or weight-based adjustment of the mA/kV was utilized to reduce the radiation dose to as low as reasonably achievable. COMPARISON: CT abdomen and pelvis 08/24/2023. CLINICAL HISTORY: Abdominal pain, acute, nonlocalized. FINDINGS: LOWER CHEST: No acute abnormality. LIVER: The liver is unremarkable. GALLBLADDER AND BILE DUCTS: Gallbladder is unremarkable. No biliary ductal dilatation. SPLEEN: No acute abnormality. PANCREAS: No acute abnormality. ADRENAL GLANDS: No acute abnormality. KIDNEYS, URETERS AND BLADDER: Bilateral renal atrophy is again noted. There are numerous cysts and cysts with calcifications similar to the prior study. The largest is in the right kidney measuring 3.1 cm. There are additional mildly hyperdense areas in both kidneys which appear similar to prior. There is no hydronephrosis. No perinephric or periureteral stranding. The bladder is decompressed. GI AND BOWEL: Stomach demonstrates no acute abnormality. The appendix is not definitively seen. There is a cluster of dilated small bowel in the right mid abdomen with surrounding inflammatory stranding and mesenteric edema. These bowel loops have air fluid levels. Otherwise, small bowel loops and colon are nondilated. There is no pneumatosis. PERITONEUM AND RETROPERITONEUM: No ascites. No free air. VASCULATURE: Atherosclerotic calcifications throughout the aorta. Right femoral venous catheter tip ends in the IVC. LYMPH NODES: No lymphadenopathy. REPRODUCTIVE ORGANS: No acute abnormality. 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 Bypass graft partially visualized in the anterior left thigh. No acute osseous abnormality. No focal soft tissue abnormality. IMPRESSION: 1. Cluster of dilated small bowel in the right mid abdomen with surrounding inflammatory stranding and mesenteric edema. This appears similar to the prior study and is most likely related to internal hernia. No bowel obstruction. 2. Bilateral renal atrophy with numerous renal cysts, some calcified, similar to the prior study. No hydronephrosis. Mildly hyperdense renal lesions are also stable. . Prominent Electronically signed by: Greig Pique MD 01/09/2024 11:31 PM EST RP Workstation: [id number]     Procedures   Medications Ordered in the ED  LORazepam (ATIVAN) injection 0.5 mg (0.5 mg Intravenous Given 01/10/24 2025)  naloxone (NARCAN)  injection 0.4 mg (has no administration in time range)  HYDROmorphone  (DILAUDID ) injection 0.5 mg (0.5 mg Intravenous Given 01/10/24 2024)  acetaminophen  (TYLENOL ) tablet 650 mg (has no administration in time range)    Or  acetaminophen  (TYLENOL ) suppository 650 mg (has no administration in time range)  hydrALAZINE  (APRESOLINE ) injection 10 mg (has no administration in time range)  pantoprazole  (PROTONIX ) injection 40 mg (has no administration in time range)  ondansetron  (ZOFRAN ) injection 4 mg (4 mg Intravenous Given 01/09/24 2308)  lactated ringers  bolus 500 mL (0 mLs Intravenous Stopped 01/10/24 1545)  HYDROmorphone  (DILAUDID ) injection 0.5 mg (0.5 mg Subcutaneous Given 01/09/24 2307)  HYDROmorphone  (DILAUDID ) injection 0.5 mg (0.5 mg Subcutaneous Given 01/10/24 1543)  potassium chloride  10 mEq in 100 mL IVPB (10 mEq Intravenous New Bag/Given 01/10/24 1742)  diatrizoate  meglumine -sodium (GASTROGRAFIN ) 66-10 % solution 40 mL (40 mLs Per NG tube Given 01/10/24 1804)                                    Medical Decision Making Risk Prescription drug management. Decision regarding  hospitalization.   Initial Impression and Ddx 49 year old well-appearing male presenting for abdominal pain and vomiting.  Exam notable for generalized abdominal tenderness.  DDx includes bowel obstruction, intra-abdominal infection or inflammation, electrolyte derangement, other. Patient PMH that increases complexity of ED encounter:  ESRD, CHF, Crohn's, Lupus, also reports recent diagnosis of small bowel obstruction  Interpretation of Diagnostics - I independent reviewed and interpreted the labs as followed: potassium 2.7, mag 1.9, gap 17  - I independently visualized the following imaging with scope of interpretation limited to determining acute life threatening conditions related to emergency care: CT ab/pelvis, which revealed  1. Cluster of dilated small bowel in the right mid abdomen with surrounding inflammatory stranding and mesenteric edema. This appears similar to the prior study and is most likely related to internal hernia. No bowel obstruction. 2. Bilateral renal atrophy with numerous renal cysts, some calcified, similar to the prior study. No hydronephrosis. Mildly hyperdense renal lesions are also stable.  - I personally reviewed interpret EKG which revealed sinus rhythm, Qtc 521  Patient Reassessment and Ultimate Disposition/Management Evaluated by Dr. Ebbie of surgery and advised medical admission for now.  At this time not concern for obstructive process given his benign exam.  For his recommendations, see his note.  Has been stable on room air.  Admitted to hospital service with Dr. Marcene.  Patient management required discussion with the following services or consulting groups:  Hospitalist Service and General/Trauma Surgery  Complexity of Problems Addressed Acute complicated illness or Injury  Additional Data Reviewed and Analyzed Further history obtained from: Past medical history and medications listed in the EMR and Prior ED visit notes  Patient Encounter Risk  Assessment Consideration of hospitalization      Final diagnoses:  Abdominal pain, unspecified abdominal location    ED Discharge Orders     None          Gal Feldhaus K, PA-C 01/10/24 2310    Patsey Lot, MD 01/10/24 2330  "

## 2024-01-10 NOTE — H&P (View-Only) (Signed)
 Reason for Consult:ab pain Referring Physician: Dr Pickering  Nathan Castaneda is an 49 y.o. male.  HPI: 72 yom with prior pd catheter and did PD for one year.  This was stopped due to peritonitis.  He has had a sbo before. He is here today for ab pain. This pain has been present for months. It has gotten worse in the last month. He has had n/v for past month.  He has epigastric pain five minutes after eating. He is passing flatus as well as bowel movements (last two days ago).  He has undergone labs here that show K 2.7, lipase 153, wbc of 3.3, and a ct that shows some mild dilated sb in the right abdomen that the report states can be internal hernia. Although this looks similar to prior. No real evidence of obstruction.  We were asked to see him.    Past Medical History:  Diagnosis Date   Anemia    Anxiety    History of recent blood transfusion    Hypertension    Lupus    Renal disease     Past Surgical History:  Procedure Laterality Date   ARTERY REPAIR Left 07/06/2023   Procedure: PRIMARY END TO END ANASTOMOSIS OF LEFT BRACHIAL ARTERY;  Surgeon: Lanis Fonda BRAVO, MD;  Location: [facility] OR;  Service: Vascular;  Laterality: Left;   AV FISTULA PLACEMENT     ESOPHAGOGASTRODUODENOSCOPY (EGD) WITH PROPOFOL  N/A 04/08/2021   Procedure: ESOPHAGOGASTRODUODENOSCOPY (EGD) WITH PROPOFOL ;  Surgeon: Rollin Dover, MD;  Location: [facility] ENDOSCOPY;  Service: Gastroenterology;  Laterality: N/A;   EXCHANGE OF A DIALYSIS CATHETER Right 07/06/2023   Procedure: EXCHANGE OF RIGHT FEMORAL VEIN DIALYSIS CATHETER;  Surgeon: Lanis Fonda BRAVO, MD;  Location: [facility] OR;  Service: Vascular;  Laterality: Right;   INSERTION OF DIALYSIS CATHETER Right 08/28/2023   Procedure: TUNNELED DIALYSIS CATHETER EXCHANGE;  Surgeon: Pearline Norman RAMAN, MD;  Location: [facility] OR;  Service: Vascular;  Laterality: Right;   IR FLUORO GUIDE CV LINE RIGHT  09/23/2021   IR FLUORO GUIDE CV LINE RIGHT  02/23/2022   PERITONEAL CATHETER INSERTION     REMOVAL  OF GRAFT Left 07/06/2023   Procedure: REMOVAL LEFT ARM HERO GRAFT;  Surgeon: Lanis Fonda BRAVO, MD;  Location: [facility] OR;  Service: Vascular;  Laterality: Left;   REVISON OF ARTERIOVENOUS FISTULA Right 09/12/2019   Procedure: 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 artery with reversed saphenous vein from left ankle;  Surgeon: Oris Krystal FALCON, MD;  Location: [facility] OR;  Service: Vascular;  Laterality: Right;   TUNNELLED CATHETER EXCHANGE N/A 07/10/2023   Procedure: TUNNELLED CATHETER EXCHANGE;  Surgeon: Serene Gaile ORN, MD;  Location: [facility] INVASIVE CV LAB;  Service: Cardiovascular;  Laterality: N/A;   TUNNELLED CATHETER EXCHANGE Right 12/27/2023   Procedure: TUNNELLED CATHETER EXCHANGE;  Surgeon: Sheree Penne Bruckner, MD;  Location: [facility] LAB;  Service: Cardiovascular;  Laterality: Right;   VEIN HARVEST Left 09/12/2019   Procedure: SAPHENOUS VEIN HARVEST;  Surgeon: Oris Krystal FALCON, MD;  Location: [facility] OR;  Service: Vascular;  Laterality: Left;   VEIN HARVEST Right 07/06/2023   Procedure: RIGHT GREATER SAPHENECTOMY;  Surgeon: Lanis Fonda BRAVO, MD;  Location: [facility] OR;  Service: Vascular;  Laterality: Right;   WOUND DEBRIDEMENT Right 09/26/2019   Procedure: RIGHT UPPER EXTREMITY WOUND WASHOUT;  Surgeon: Oris Krystal FALCON, MD;  Location: [facility] OR;  Service: Vascular;  Laterality: Right;    History reviewed. No pertinent family history.  Social History:  reports that  he has been smoking cigarettes. He has a 7.5 pack-year smoking history. He has never used smokeless tobacco. He reports that he does not drink alcohol and does not use drugs.  Allergies: Allergies[1]  Medications: Prior to Admission: (Not in a hospital admission)   Results for orders placed or performed during the hospital encounter of 01/09/24 (from the past 48 hours)  Urinalysis, w/ Reflex to Culture (Infection Suspected) -Urine, Clean Catch     Status: Abnormal   Collection Time: 01/09/24 12:24 PM  Result Value Ref Range    Specimen Source URINE, CLEAN CATCH    Color, Urine YELLOW YELLOW   APPearance TURBID (A) CLEAR   Specific Gravity, Urine 1.022 1.005 - 1.030   pH 7.0 5.0 - 8.0   Glucose, UA NEGATIVE NEGATIVE mg/dL   Hgb urine dipstick SMALL (A) NEGATIVE   Bilirubin Urine NEGATIVE NEGATIVE   Ketones, ur NEGATIVE NEGATIVE mg/dL   Protein, ur >=699 (A) NEGATIVE mg/dL   Nitrite NEGATIVE NEGATIVE   Leukocytes,Ua SMALL (A) NEGATIVE   RBC / HPF >50 0 - 5 RBC/hpf   WBC, UA >50 0 - 5 WBC/hpf    Comment:        Reflex urine culture not performed if WBC <=10, OR if Squamous epithelial cells >5. If Squamous epithelial cells >5 suggest recollection.    Bacteria, UA MANY (A) NONE SEEN   Squamous Epithelial / HPF 6-10 0 - 5 /HPF   Hyaline Casts, UA PRESENT    Non Squamous Epithelial 0-5 (A) NONE SEEN    Comment: Performed at [facility], [address] [place], [place] [address]  CBC with Differential     Status: Abnormal   Collection Time: 01/09/24 10:39 PM  Result Value Ref Range   WBC 3.3 (L) 4.0 - 10.5 K/uL   RBC 4.33 4.22 - 5.81 MIL/uL   Hemoglobin 10.7 (L) 13.0 - 17.0 g/dL   HCT 66.5 (L) 60.9 - 47.9 %   MCV 77.1 (L) 80.0 - 100.0 fL   MCH 24.7 (L) 26.0 - 34.0 pg   MCHC 32.0 30.0 - 36.0 g/dL   RDW 84.0 (H) 88.4 - 84.4 %   Platelets 149 (L) 150 - 400 K/uL   nRBC 0.0 0.0 - 0.2 %   Neutrophils Relative % 52 %   Neutro Abs 1.7 1.7 - 7.7 K/uL   Lymphocytes Relative 38 %   Lymphs Abs 1.3 0.7 - 4.0 K/uL   Monocytes Relative 10 %   Monocytes Absolute 0.3 0.1 - 1.0 K/uL   Eosinophils Relative 0 %   Eosinophils Absolute 0.0 0.0 - 0.5 K/uL   Basophils Relative 0 %   Basophils Absolute 0.0 0.0 - 0.1 K/uL   Immature Granulocytes 0 %   Abs Immature Granulocytes 0.01 0.00 - 0.07 K/uL    Comment: Performed at [facility], [address], [place], [place] [address]  Comprehensive metabolic panel     Status: Abnormal   Collection Time: 01/09/24 10:39 PM  Result Value Ref Range   Sodium 139  135 - 145 mmol/L   Potassium 2.7 (LL) 3.5 - 5.1 mmol/L    Comment: Critical Value, Read Back and verified with Toothman, B. RN at 2335 01/09/24 [place]   Chloride 94 (L) 98 - 111 mmol/L   CO2 29 22 - 32 mmol/L   Glucose, Bld 94 70 - 99 mg/dL    Comment: Glucose reference range applies only to samples taken after fasting for at least 8  hours.   BUN 24 (H) 6 - 20 mg/dL   Creatinine, Ser 1.65 (H) 0.61 - 1.24 mg/dL   Calcium 9.6 8.9 - 89.6 mg/dL   Total Protein 8.3 (H) 6.5 - 8.1 g/dL   Albumin 4.1 3.5 - 5.0 g/dL   AST 16 15 - 41 U/L   ALT 6 0 - 44 U/L   Alkaline Phosphatase 79 38 - 126 U/L   Total Bilirubin 0.8 0.0 - 1.2 mg/dL   GFR, Estimated 7 (L) >60 mL/min    Comment: (NOTE) Calculated using the CKD-EPI Creatinine Equation (2021)    Anion gap 17 (H) 5 - 15    Comment: Performed at [facility], [address], [place], [place] [address]  Magnesium      Status: None   Collection Time: 01/09/24 10:39 PM  Result Value Ref Range   Magnesium  1.9 1.7 - 2.4 mg/dL    Comment: Performed at [facility], [address], [place], [place] [address]  Lipase, blood     Status: Abnormal   Collection Time: 01/09/24 10:39 PM  Result Value Ref Range   Lipase 153 (H) 11 - 51 U/L    Comment: Performed at [facility], [address] [place], [place] [address]    CT ABDOMEN PELVIS WO CONTRAST Result Date: 01/09/2024 EXAM: CT ABDOMEN AND PELVIS WITHOUT CONTRAST 01/09/2024 10:48:00 PM TECHNIQUE: CT of the abdomen and pelvis was performed without the administration of intravenous contrast. Multiplanar reformatted images are provided for review. Automated exposure control, iterative reconstruction, and/or weight-based adjustment of the mA/kV was utilized to reduce the radiation dose to as low as reasonably achievable. COMPARISON: CT abdomen and pelvis 08/24/2023. CLINICAL HISTORY: Abdominal pain, acute, nonlocalized. FINDINGS: LOWER CHEST: No acute abnormality. LIVER: The liver is  unremarkable. GALLBLADDER AND BILE DUCTS: Gallbladder is unremarkable. No biliary ductal dilatation. SPLEEN: No acute abnormality. PANCREAS: No acute abnormality. ADRENAL GLANDS: No acute abnormality. KIDNEYS, URETERS AND BLADDER: Bilateral renal atrophy is again noted. There are numerous cysts and cysts with calcifications similar to the prior study. The largest is in the right kidney measuring 3.1 cm. There are additional mildly hyperdense areas in both kidneys which appear similar to prior. There is no hydronephrosis. No perinephric or periureteral stranding. The bladder is decompressed. GI AND BOWEL: Stomach demonstrates no acute abnormality. The appendix is not definitively seen. There is a cluster of dilated small bowel in the right mid abdomen with surrounding inflammatory stranding and mesenteric edema. These bowel loops have air fluid levels. Otherwise, small bowel loops and colon are nondilated. There is no pneumatosis. PERITONEUM AND RETROPERITONEUM: No ascites. No free air. VASCULATURE: Atherosclerotic calcifications throughout the aorta. Right femoral venous catheter tip ends in the IVC. LYMPH NODES: No lymphadenopathy. REPRODUCTIVE ORGANS: No acute abnormality. 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 Bypass graft partially visualized in the anterior left thigh. No acute osseous abnormality. No focal soft tissue abnormality. IMPRESSION: 1. Cluster of dilated small bowel in the right mid abdomen with surrounding inflammatory stranding and mesenteric edema. This appears similar to the prior study and is most likely related to internal hernia. No bowel obstruction. 2. Bilateral renal atrophy with numerous renal cysts, some calcified, similar to the prior study. No hydronephrosis. Mildly hyperdense renal lesions are also stable. . Prominent Electronically signed by: Greig Pique MD 01/09/2024 11:31 PM EST RP Workstation: [id number]     Review of Systems  Constitutional:  Negative for fever.  Gastrointestinal:  Positive for abdominal pain, constipation and nausea.  All other systems reviewed and are negative.  Blood pressure (!) 147/97, pulse (!) 59, temperature (!) 97.5 F (36.4 C), resp. rate 13, height 5' 10 (1.778 m), weight 69 kg, SpO2 98%. Physical Exam Vitals reviewed.  Constitutional:      Appearance: He is well-developed.  Eyes:     General: No scleral icterus. Cardiovascular:     Rate and Rhythm: Normal rate and regular rhythm.  Pulmonary:     Effort: Pulmonary effort is normal.  Abdominal:     General: There is no distension.     Palpations: Abdomen is soft.     Tenderness: There is no abdominal tenderness.     Hernia: No hernia is present.  Skin:    General: Skin is warm and dry.     Capillary Refill: Capillary refill takes less than 2 seconds.  Neurological:     General: No focal deficit present.     Mental Status: He is alert.  Psychiatric:        Mood and Affect: Mood is anxious.     Assessment/Plan: Abdominal pain -he is afraid to eat now as he feels nausea very shortly after eating. -his abdomen is benign and time course certainly argues against an general surgery emergency.   -I think the best plan is for admission by medicine- would consider GI eval for possible ulcer disease, obstruction. Can also consider a CTA for this to evaluate vasculature -I will give him 40 cc of gastrograffin orally to see if this traverses to his colon -will follow with you  I reviewed last 24 h vitals and pain scores, last 48 h intake and output, last 24 h labs and trends, and last 24 h imaging results.     Donnice Bury 01/10/2024, 5:24 PM         [1]  Allergies Allergen Reactions   Firvanq  [Vancomycin ] Itching

## 2024-01-10 NOTE — H&P (Signed)
 " History and Physical      Nathan Castaneda FMW:[id number] DOB: 02-27-1975 DOA: 01/09/2024; DOS: 01/10/2024  PCP: Norine Manuelita LABOR, MD *** Patient coming from: home ***  I have personally briefly reviewed patient's old medical records in [facility] Health Link  Chief Complaint: ***  HPI: Nathan Castaneda is a 49 y.o. male with medical history significant for *** who is admitted to [facility] on 01/09/2024 with *** after presenting from home*** to [facility] ED complaining of ***.    ***       ***   ED Course:  Vital signs in the ED were notable for the following: ***  Labs were notable for the following: ***  Per my interpretation, EKG in ED demonstrated the following:  ***  Imaging in the ED, per corresponding formal radiology read, was notable for the following:  ***  EDP d/w on-call general general, Dr. Ebbie, who has evaluated the patient and is consulting. Dr. Ebbie feels that patient's abd discomfort is less likely due to the internal hernia, which appears unchanged from prior CT abd/pelvis, and that presentation is also less consistent with SBO, with consideration for PUD as potential source of prandial n/v abd pain. Dr. Ebbie recommends hospitalist admission and conveys that general surgery will continue follow. He has ordered gastrograffin small bowel follow through, with plain films pending at this time.   While in the ED, the following were administered: ***  Subsequently, the patient was admitted  ***  ***red    Review of Systems: As per HPI otherwise 10 point review of systems negative.   Past Medical History:  Diagnosis Date   Anemia    Anxiety    History of recent blood transfusion    Hypertension    Lupus    Renal disease     Past Surgical History:  Procedure Laterality Date   ARTERY REPAIR Left 07/06/2023   Procedure: PRIMARY END TO END ANASTOMOSIS OF LEFT BRACHIAL ARTERY;  Surgeon: Lanis Fonda BRAVO, MD;  Location: [facility] OR;  Service:  Vascular;  Laterality: Left;   AV FISTULA PLACEMENT     ESOPHAGOGASTRODUODENOSCOPY (EGD) WITH PROPOFOL  N/A 04/08/2021   Procedure: ESOPHAGOGASTRODUODENOSCOPY (EGD) WITH PROPOFOL ;  Surgeon: Rollin Dover, MD;  Location: [facility] ENDOSCOPY;  Service: Gastroenterology;  Laterality: N/A;   EXCHANGE OF A DIALYSIS CATHETER Right 07/06/2023   Procedure: EXCHANGE OF RIGHT FEMORAL VEIN DIALYSIS CATHETER;  Surgeon: Lanis Fonda BRAVO, MD;  Location: [facility] OR;  Service: Vascular;  Laterality: Right;   INSERTION OF DIALYSIS CATHETER Right 08/28/2023   Procedure: TUNNELED DIALYSIS CATHETER EXCHANGE;  Surgeon: Pearline Norman RAMAN, MD;  Location: [facility] OR;  Service: Vascular;  Laterality: Right;   IR FLUORO GUIDE CV LINE RIGHT  09/23/2021   IR FLUORO GUIDE CV LINE RIGHT  02/23/2022   PERITONEAL CATHETER INSERTION     REMOVAL OF GRAFT Left 07/06/2023   Procedure: REMOVAL LEFT ARM HERO GRAFT;  Surgeon: Lanis Fonda BRAVO, MD;  Location: [facility] OR;  Service: Vascular;  Laterality: Left;   REVISON OF ARTERIOVENOUS FISTULA Right 09/12/2019   Procedure: 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 artery with reversed saphenous vein from left ankle;  Surgeon: Oris Krystal FALCON, MD;  Location: [facility] OR;  Service: Vascular;  Laterality: Right;   TUNNELLED CATHETER EXCHANGE N/A 07/10/2023   Procedure: TUNNELLED CATHETER EXCHANGE;  Surgeon: Serene Gaile ORN, MD;  Location: [facility] INVASIVE CV LAB;  Service: Cardiovascular;  Laterality: N/A;   TUNNELLED CATHETER EXCHANGE Right 12/27/2023  Procedure: TUNNELLED CATHETER EXCHANGE;  Surgeon: Sheree Penne Bruckner, MD;  Location: [facility] LAB;  Service: Cardiovascular;  Laterality: Right;   VEIN HARVEST Left 09/12/2019   Procedure: SAPHENOUS VEIN HARVEST;  Surgeon: Oris Krystal FALCON, MD;  Location: [facility] OR;  Service: Vascular;  Laterality: Left;   VEIN HARVEST Right 07/06/2023   Procedure: RIGHT GREATER SAPHENECTOMY;  Surgeon: Lanis Fonda BRAVO, MD;  Location: [facility] OR;  Service: Vascular;   Laterality: Right;   WOUND DEBRIDEMENT Right 09/26/2019   Procedure: RIGHT UPPER EXTREMITY WOUND WASHOUT;  Surgeon: Oris Krystal FALCON, MD;  Location: [facility] OR;  Service: Vascular;  Laterality: Right;    Social History:  reports that he has been smoking cigarettes. He has a 7.5 pack-year smoking history. He has never used smokeless tobacco. He reports that he does not drink alcohol and does not use drugs.   Allergies[1]  History reviewed. No pertinent family history.  Family history reviewed and not pertinent ***   Prior to Admission medications  Medication Sig Start Date End Date Taking? Authorizing Provider  acetaminophen  (TYLENOL ) 500 MG tablet Take 1,000 mg by mouth 2 (two) times daily as needed for moderate pain (pain score 4-6), fever or headache.   Yes [provider]  ferric citrate  (AURYXIA ) 1 GM 210 MG(Fe) tablet Take 630 mg by mouth in the morning, at noon, and at bedtime.   Yes [provider]  LOKELMA  10 g PACK packet Take 10 g by mouth daily.   Yes [provider]  pantoprazole  (PROTONIX ) 40 MG tablet Take 40 mg by mouth daily.   Yes [provider]  sevelamer carbonate (RENVELA) 800 MG tablet 1,600 mg 3 (three) times daily with meals. And snacks 12/18/23 12/17/24 Yes [provider]     Objective    Physical Exam: Vitals:   01/10/24 1409 01/10/24 1711 01/10/24 1800 01/10/24 1926  BP: (!) 167/104 (!) 147/97 (!) 189/107 (!) 181/116  Pulse: 75 (!) 59 64 87  Resp: 19 13 14  (!) 22  Temp: (!) 97.5 F (36.4 C)  97.9 F (36.6 C)   TempSrc:   Oral   SpO2: 99% 98% 98% 100%  Weight:      Height:        General: appears to be stated age; alert, oriented Skin: warm, dry, no rash Head:  AT/[place] Mouth:  Oral mucosa membranes appear moist, normal dentition Neck: supple; trachea midline Heart:  RRR; did not appreciate any M/R/G Lungs: CTAB, did not appreciate any wheezes, rales, or rhonchi Abdomen: + BS; soft, ND, NT Vascular: 2+  pedal pulses b/l; 2+ radial pulses b/l Extremities: no peripheral edema, no muscle wasting   ***   *** Neuro: strength and sensation intact in upper and lower extremities b/l  *** Neuro: 5/5 strength of the proximal and distal flexors and extensors of the upper and lower extremities bilaterally; sensation intact in upper and lower extremities b/l; cranial nerves II through XII grossly intact; no pronator drift; no evidence suggestive of slurred speech, dysarthria, or facial droop; Normal muscle tone. No tremors. *** Neuro: In the setting of the patient's current mental status and associated inability to follow instructions, unable to perform full neurologic exam at this time.  As such, assessment of strength, sensation, and cranial nerves is limited at this time. Patient noted to spontaneously move all 4 extremities. No tremors.  ***        Labs on Admission: I have personally reviewed following labs and imaging studies  CBC: Recent  Labs  Lab 01/09/24 2239  WBC 3.3*  NEUTROABS 1.7  HGB 10.7*  HCT 33.4*  MCV 77.1*  PLT 149*   Basic Metabolic Panel: Recent Labs  Lab 01/09/24 2239  NA 139  K 2.7*  CL 94*  CO2 29  GLUCOSE 94  BUN 24*  CREATININE 8.34*  CALCIUM 9.6  MG 1.9   GFR: Estimated Creatinine Clearance: 10.7 mL/min (A) (by C-G formula based on SCr of 8.34 mg/dL (H)). Liver Function Tests: Recent Labs  Lab 01/09/24 2239  AST 16  ALT 6  ALKPHOS 79  BILITOT 0.8  PROT 8.3*  ALBUMIN 4.1   Recent Labs  Lab 01/09/24 2239  LIPASE 153*   No results for input(s): AMMONIA in the last 168 hours. Coagulation Profile: No results for input(s): INR, PROTIME in the last 168 hours. Cardiac Enzymes: No results for input(s): CKTOTAL, CKMB, CKMBINDEX, TROPONINI in the last 168 hours. BNP (last 3 results) No results for input(s): PROBNP in the last 8760 hours. HbA1C: No results for input(s): HGBA1C in the last 72 hours. CBG: No results for  input(s): GLUCAP in the last 168 hours. Lipid Profile: No results for input(s): CHOL, HDL, LDLCALC, TRIG, CHOLHDL, LDLDIRECT in the last 72 hours. Thyroid Function Tests: No results for input(s): TSH, T4TOTAL, FREET4, T3FREE, THYROIDAB in the last 72 hours. Anemia Panel: No results for input(s): VITAMINB12, FOLATE, FERRITIN, TIBC, IRON , RETICCTPCT in the last 72 hours. Urine analysis:    Component Value Date/Time   COLORURINE YELLOW 01/09/2024 1224   APPEARANCEUR TURBID (A) 01/09/2024 1224   LABSPEC 1.022 01/09/2024 1224   PHURINE 7.0 01/09/2024 1224   GLUCOSEU NEGATIVE 01/09/2024 1224   HGBUR SMALL (A) 01/09/2024 1224   BILIRUBINUR NEGATIVE 01/09/2024 1224   KETONESUR NEGATIVE 01/09/2024 1224   PROTEINUR >=300 (A) 01/09/2024 1224   NITRITE NEGATIVE 01/09/2024 1224   LEUKOCYTESUR SMALL (A) 01/09/2024 1224    Radiological Exams on Admission: CT ABDOMEN PELVIS WO CONTRAST Result Date: 01/09/2024 EXAM: CT ABDOMEN AND PELVIS WITHOUT CONTRAST 01/09/2024 10:48:00 PM TECHNIQUE: CT of the abdomen and pelvis was performed without the administration of intravenous contrast. Multiplanar reformatted images are provided for review. Automated exposure control, iterative reconstruction, and/or weight-based adjustment of the mA/kV was utilized to reduce the radiation dose to as low as reasonably achievable. COMPARISON: CT abdomen and pelvis 08/24/2023. CLINICAL HISTORY: Abdominal pain, acute, nonlocalized. FINDINGS: LOWER CHEST: No acute abnormality. LIVER: The liver is unremarkable. GALLBLADDER AND BILE DUCTS: Gallbladder is unremarkable. No biliary ductal dilatation. SPLEEN: No acute abnormality. PANCREAS: No acute abnormality. ADRENAL GLANDS: No acute abnormality. KIDNEYS, URETERS AND BLADDER: Bilateral renal atrophy is again noted. There are numerous cysts and cysts with calcifications similar to the prior study. The largest is in the right kidney measuring 3.1 cm.  There are additional mildly hyperdense areas in both kidneys which appear similar to prior. There is no hydronephrosis. No perinephric or periureteral stranding. The bladder is decompressed. GI AND BOWEL: Stomach demonstrates no acute abnormality. The appendix is not definitively seen. There is a cluster of dilated small bowel in the right mid abdomen with surrounding inflammatory stranding and mesenteric edema. These bowel loops have air fluid levels. Otherwise, small bowel loops and colon are nondilated. There is no pneumatosis. PERITONEUM AND RETROPERITONEUM: No ascites. No free air. VASCULATURE: Atherosclerotic calcifications throughout the aorta. Right femoral venous catheter tip ends in the IVC. LYMPH NODES: No lymphadenopathy. REPRODUCTIVE ORGANS: No acute abnormality. 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 Bypass graft partially visualized in the anterior left thigh. No acute osseous abnormality. No focal soft tissue abnormality. IMPRESSION: 1. Cluster of dilated small bowel in the right mid abdomen with surrounding inflammatory stranding and mesenteric edema. This appears similar to the prior study and is most likely related to internal hernia. No bowel obstruction. 2. Bilateral renal atrophy with numerous renal cysts, some calcified, similar to the prior study. No hydronephrosis. Mildly hyperdense renal lesions are also stable. . Prominent Electronically signed by: Greig Pique MD 01/09/2024 11:31 PM EST RP Workstation: [id number]      Assessment/Plan   Principal Problem:   Intractable nausea and  vomiting   ***            ***                     ***                      ***                     ***                     ***                     ***                      ***                     ***                     ***                     ***                     ***                    ***                   ***  DVT prophylaxis: SCD's ***  Code Status: Full code*** Family Communication: none*** Disposition Plan: Per Rounding Team Consults called: EDP d/w on-call general general, Dr. Ebbie, who has evaluated the patient and is consulting. Dr. Ebbie feels that patient's abd discomfort is less likely due to the internal hernia, which appears unchanged from prior CT abd/pelvis, and that presentation is also less consistent with SBO, with consideration for PUD as potential source of prandial n/v abd pain. Dr. Ebbie recommends hospitalist admission and conveys that general surgery will continue follow. He has ordered gastrograffin small bowel follow through, with plain films pending at this time. ***;  Admission status: ***     I SPENT GREATER THAN 75 *** MINUTES IN CLINICAL CARE TIME/MEDICAL DECISION-MAKING IN COMPLETING THIS ADMISSION.      Eva NOVAK Isabella Roemmich DO [facility]  From 7PM - 7AM   01/10/2024, 8:25 PM   ***     [1]  Allergies Allergen Reactions   Firvanq  [Vancomycin ] Itching   "

## 2024-01-11 ENCOUNTER — Observation Stay (HOSPITAL_COMMUNITY)

## 2024-01-11 ENCOUNTER — Encounter (HOSPITAL_COMMUNITY): Payer: Self-pay | Admitting: Internal Medicine

## 2024-01-11 DIAGNOSIS — Z79899 Other long term (current) drug therapy: Secondary | ICD-10-CM | POA: Diagnosis not present

## 2024-01-11 DIAGNOSIS — E876 Hypokalemia: Secondary | ICD-10-CM | POA: Diagnosis present

## 2024-01-11 DIAGNOSIS — I120 Hypertensive chronic kidney disease with stage 5 chronic kidney disease or end stage renal disease: Secondary | ICD-10-CM | POA: Diagnosis present

## 2024-01-11 DIAGNOSIS — G8929 Other chronic pain: Secondary | ICD-10-CM | POA: Diagnosis present

## 2024-01-11 DIAGNOSIS — D61818 Other pancytopenia: Secondary | ICD-10-CM | POA: Diagnosis present

## 2024-01-11 DIAGNOSIS — N2581 Secondary hyperparathyroidism of renal origin: Secondary | ICD-10-CM | POA: Diagnosis present

## 2024-01-11 DIAGNOSIS — F17210 Nicotine dependence, cigarettes, uncomplicated: Secondary | ICD-10-CM | POA: Diagnosis present

## 2024-01-11 DIAGNOSIS — R1084 Generalized abdominal pain: Secondary | ICD-10-CM | POA: Diagnosis not present

## 2024-01-11 DIAGNOSIS — R634 Abnormal weight loss: Secondary | ICD-10-CM | POA: Diagnosis not present

## 2024-01-11 DIAGNOSIS — D631 Anemia in chronic kidney disease: Secondary | ICD-10-CM | POA: Diagnosis present

## 2024-01-11 DIAGNOSIS — R1010 Upper abdominal pain, unspecified: Secondary | ICD-10-CM | POA: Diagnosis not present

## 2024-01-11 DIAGNOSIS — I10 Essential (primary) hypertension: Secondary | ICD-10-CM | POA: Diagnosis not present

## 2024-01-11 DIAGNOSIS — Z888 Allergy status to other drugs, medicaments and biological substances status: Secondary | ICD-10-CM | POA: Diagnosis not present

## 2024-01-11 DIAGNOSIS — I509 Heart failure, unspecified: Secondary | ICD-10-CM | POA: Diagnosis not present

## 2024-01-11 DIAGNOSIS — Z992 Dependence on renal dialysis: Secondary | ICD-10-CM | POA: Diagnosis not present

## 2024-01-11 DIAGNOSIS — K56609 Unspecified intestinal obstruction, unspecified as to partial versus complete obstruction: Secondary | ICD-10-CM | POA: Diagnosis present

## 2024-01-11 DIAGNOSIS — K469 Unspecified abdominal hernia without obstruction or gangrene: Secondary | ICD-10-CM | POA: Diagnosis not present

## 2024-01-11 DIAGNOSIS — D509 Iron deficiency anemia, unspecified: Secondary | ICD-10-CM | POA: Diagnosis present

## 2024-01-11 DIAGNOSIS — R933 Abnormal findings on diagnostic imaging of other parts of digestive tract: Secondary | ICD-10-CM | POA: Diagnosis not present

## 2024-01-11 DIAGNOSIS — N186 End stage renal disease: Secondary | ICD-10-CM | POA: Diagnosis present

## 2024-01-11 DIAGNOSIS — N189 Chronic kidney disease, unspecified: Secondary | ICD-10-CM | POA: Diagnosis not present

## 2024-01-11 DIAGNOSIS — Z881 Allergy status to other antibiotic agents status: Secondary | ICD-10-CM | POA: Diagnosis not present

## 2024-01-11 DIAGNOSIS — I130 Hypertensive heart and chronic kidney disease with heart failure and stage 1 through stage 4 chronic kidney disease, or unspecified chronic kidney disease: Secondary | ICD-10-CM | POA: Diagnosis not present

## 2024-01-11 DIAGNOSIS — Z862 Personal history of diseases of the blood and blood-forming organs and certain disorders involving the immune mechanism: Secondary | ICD-10-CM

## 2024-01-11 DIAGNOSIS — R112 Nausea with vomiting, unspecified: Secondary | ICD-10-CM | POA: Diagnosis present

## 2024-01-11 DIAGNOSIS — R9431 Abnormal electrocardiogram [ECG] [EKG]: Secondary | ICD-10-CM | POA: Diagnosis present

## 2024-01-11 DIAGNOSIS — K660 Peritoneal adhesions (postprocedural) (postinfection): Secondary | ICD-10-CM | POA: Diagnosis present

## 2024-01-11 LAB — CBC WITH DIFFERENTIAL/PLATELET
Abs Immature Granulocytes: 0.01 K/uL (ref 0.00–0.07)
Basophils Absolute: 0 K/uL (ref 0.0–0.1)
Basophils Relative: 0 %
Eosinophils Absolute: 0 K/uL (ref 0.0–0.5)
Eosinophils Relative: 1 %
HCT: 29.2 % — ABNORMAL LOW (ref 39.0–52.0)
Hemoglobin: 9.5 g/dL — ABNORMAL LOW (ref 13.0–17.0)
Immature Granulocytes: 0 %
Lymphocytes Relative: 38 %
Lymphs Abs: 1.2 K/uL (ref 0.7–4.0)
MCH: 24.7 pg — ABNORMAL LOW (ref 26.0–34.0)
MCHC: 32.5 g/dL (ref 30.0–36.0)
MCV: 76 fL — ABNORMAL LOW (ref 80.0–100.0)
Monocytes Absolute: 0.2 K/uL (ref 0.1–1.0)
Monocytes Relative: 8 %
Neutro Abs: 1.6 K/uL — ABNORMAL LOW (ref 1.7–7.7)
Neutrophils Relative %: 53 %
Platelets: 121 K/uL — ABNORMAL LOW (ref 150–400)
RBC: 3.84 MIL/uL — ABNORMAL LOW (ref 4.22–5.81)
RDW: 16.1 % — ABNORMAL HIGH (ref 11.5–15.5)
WBC: 3 K/uL — ABNORMAL LOW (ref 4.0–10.5)
nRBC: 0 % (ref 0.0–0.2)

## 2024-01-11 LAB — PHOSPHORUS: Phosphorus: 5.5 mg/dL — ABNORMAL HIGH (ref 2.5–4.6)

## 2024-01-11 LAB — COMPREHENSIVE METABOLIC PANEL WITH GFR
ALT: 7 U/L (ref 0–44)
AST: 15 U/L (ref 15–41)
Albumin: 3.8 g/dL (ref 3.5–5.0)
Alkaline Phosphatase: 74 U/L (ref 38–126)
Anion gap: 16 — ABNORMAL HIGH (ref 5–15)
BUN: 36 mg/dL — ABNORMAL HIGH (ref 6–20)
CO2: 25 mmol/L (ref 22–32)
Calcium: 8.8 mg/dL — ABNORMAL LOW (ref 8.9–10.3)
Chloride: 97 mmol/L — ABNORMAL LOW (ref 98–111)
Creatinine, Ser: 10.4 mg/dL — ABNORMAL HIGH (ref 0.61–1.24)
GFR, Estimated: 6 mL/min — ABNORMAL LOW
Glucose, Bld: 76 mg/dL (ref 70–99)
Potassium: 3 mmol/L — ABNORMAL LOW (ref 3.5–5.1)
Sodium: 138 mmol/L (ref 135–145)
Total Bilirubin: 0.7 mg/dL (ref 0.0–1.2)
Total Protein: 7.3 g/dL (ref 6.5–8.1)

## 2024-01-11 LAB — TECHNOLOGIST SMEAR REVIEW: Plt Morphology: NORMAL

## 2024-01-11 LAB — PROTIME-INR
INR: 1 (ref 0.8–1.2)
Prothrombin Time: 13.9 s (ref 11.4–15.2)

## 2024-01-11 LAB — MAGNESIUM: Magnesium: 2 mg/dL (ref 1.7–2.4)

## 2024-01-11 LAB — APTT: aPTT: 44 s — ABNORMAL HIGH (ref 24–36)

## 2024-01-11 MED ORDER — LORAZEPAM 1 MG PO TABS: 1.0000 mg | ORAL_TABLET | Freq: Four times a day (QID) | ORAL | Status: DC | PRN

## 2024-01-11 MED ORDER — SEVELAMER CARBONATE 800 MG PO TABS
1600.0000 mg | ORAL_TABLET | Freq: Three times a day (TID) | ORAL | Status: DC
  Administered 2024-01-11 – 2024-01-12 (×3): 1600 mg via ORAL
  Filled 2024-01-11 (×3): qty 2

## 2024-01-11 MED ORDER — CHLORHEXIDINE GLUCONATE CLOTH 2 % EX PADS: 6.0000 | MEDICATED_PAD | Freq: Every day | CUTANEOUS | Status: DC

## 2024-01-11 MED ORDER — TRIMETHOBENZAMIDE HCL 100 MG/ML IM SOLN
200.0000 mg | Freq: Four times a day (QID) | INTRAMUSCULAR | Status: DC | PRN
  Administered 2024-01-11 – 2024-01-13 (×3): 200 mg via INTRAMUSCULAR
  Filled 2024-01-11 (×4): qty 2

## 2024-01-11 MED ORDER — POTASSIUM CHLORIDE 10 MEQ/100ML IV SOLN
10.0000 meq | INTRAVENOUS | Status: AC
  Administered 2024-01-11 (×2): 10 meq via INTRAVENOUS
  Filled 2024-01-11 (×2): qty 100

## 2024-01-11 NOTE — Progress Notes (Signed)
 "    Subjective/Chief Complaint: Pt with pain after eating  Having BM s   Objective: Vital signs in last 24 hours: Temp:  [97.5 F (36.4 C)-97.9 F (36.6 C)] 97.7 F (36.5 C) (01/02 0859) Pulse Rate:  [59-87] 59 (01/02 1218) Resp:  [10-22] 11 (01/02 1218) BP: (110-189)/(78-116) 149/103 (01/02 1218) SpO2:  [93 %-100 %] 100 % (01/02 1218)    Intake/Output from previous day: 01/01 0701 - 01/02 0700 In: 601.6 [IV Piggyback:601.6] Out: -  Intake/Output this shift: No intake/output data recorded.  Abd: soft mild TTP throughout   Lab Results:  Recent Labs    01/09/24 2239 01/11/24 0503  WBC 3.3* 3.0*  HGB 10.7* 9.5*  HCT 33.4* 29.2*  PLT 149* 121*   BMET Recent Labs    01/09/24 2239 01/11/24 0503  NA 139 138  K 2.7* 3.0*  CL 94* 97*  CO2 29 25  GLUCOSE 94 76  BUN 24* 36*  CREATININE 8.34* 10.40*  CALCIUM 9.6 8.8*   PT/INR No results for input(s): LABPROT, INR in the last 72 hours. ABG No results for input(s): PHART, HCO3 in the last 72 hours.  Invalid input(s): PCO2, PO2  Studies/Results: DG Abd Portable 1V-Small Bowel Obstruction Protocol-initial, 8 hr delay Result Date: 01/11/2024 EXAM: 1 VIEW XRAY OF THE ABDOMEN 01/11/2024 02:00:25 AM COMPARISON: 05/16/2022 CLINICAL HISTORY: FINDINGS: LINES, TUBES AND DEVICES: Right femoral dialysis catheter in place. BOWEL: Gas-filled small bowel loops in left abdomen upper normal in caliber. Contrast material in the ascending colon. Nonobstructive bowel gas pattern. SOFT TISSUES: Aortoiliac calcified atherosclerosis. BONES: No acute fracture. IMPRESSION: 1. No acute findings. Electronically signed by: Franky Stanford MD 01/11/2024 02:23 AM EST RP Workstation: [id number]   CT ABDOMEN PELVIS WO CONTRAST Result Date: 01/09/2024 EXAM: CT ABDOMEN AND PELVIS WITHOUT CONTRAST 01/09/2024 10:48:00 PM TECHNIQUE: CT of the abdomen and pelvis was performed without the administration of intravenous contrast. Multiplanar  reformatted images are provided for review. Automated exposure control, iterative reconstruction, and/or weight-based adjustment of the mA/kV was utilized to reduce the radiation dose to as low as reasonably achievable. COMPARISON: CT abdomen and pelvis 08/24/2023. CLINICAL HISTORY: Abdominal pain, acute, nonlocalized. FINDINGS: LOWER CHEST: No acute abnormality. LIVER: The liver is unremarkable. GALLBLADDER AND BILE DUCTS: Gallbladder is unremarkable. No biliary ductal dilatation. SPLEEN: No acute abnormality. PANCREAS: No acute abnormality. ADRENAL GLANDS: No acute abnormality. KIDNEYS, URETERS AND BLADDER: Bilateral renal atrophy is again noted. There are numerous cysts and cysts with calcifications similar to the prior study. The largest is in the right kidney measuring 3.1 cm. There are additional mildly hyperdense areas in both kidneys which appear similar to prior. There is no hydronephrosis. No perinephric or periureteral stranding. The bladder is decompressed. GI AND BOWEL: Stomach demonstrates no acute abnormality. The appendix is not definitively seen. There is a cluster of dilated small bowel in the right mid abdomen with surrounding inflammatory stranding and mesenteric edema. These bowel loops have air fluid levels. Otherwise, small bowel loops and colon are nondilated. There is no pneumatosis. PERITONEUM AND RETROPERITONEUM: No ascites. No free air. VASCULATURE: Atherosclerotic calcifications throughout the aorta. Right femoral venous catheter tip ends in the IVC. LYMPH NODES: No lymphadenopathy. REPRODUCTIVE ORGANS: No acute abnormality. 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 Bypass graft partially visualized in the anterior left thigh. No acute osseous abnormality. No focal soft tissue abnormality. IMPRESSION: 1. Cluster of dilated small bowel in the right mid abdomen with surrounding  inflammatory stranding and mesenteric edema.  This appears similar to the prior study and is most likely related to internal hernia. No bowel obstruction. 2. Bilateral renal atrophy with numerous renal cysts, some calcified, similar to the prior study. No hydronephrosis. Mildly hyperdense renal lesions are also stable. . Prominent Electronically signed by: Greig Pique MD 01/09/2024 11:31 PM EST RP Workstation: [id number]    Anti-infectives: Anti-infectives (From admission, onward)    None       Assessment/Plan: Abdominal pain Contrast in the colon No signs of SBO but may have low grade chronic obstruction Continue diet for now  No acute need for operative intervention DOW surgeon to follow up   LOS: 0 days    Debby DELENA Shipper MD  01/11/2024 Low complexity  "

## 2024-01-11 NOTE — ED Notes (Signed)
PT given crackers and  ginger ale.

## 2024-01-11 NOTE — Progress Notes (Signed)
 " Progress Note   Patient: Nathan Castaneda Humble FMW:[id number] DOB: 11-16-1975 DOA: 01/09/2024     0 DOS: the patient was seen and examined on 01/11/2024    Brief hospital course:  Nathan Castaneda is a 49 y.o. male with PMH of ESRD on HD MWF, anemia of chronic disease with baseline hemoglobin 8-11, who is admitted to [facility] on 01/09/2024 with intractable nausea/vomiting for 4 to 5 days.  There was concern that this was related to an internal hernia and surgery was consulted.  They felt this was an unlikely etiology of his symptoms.  Assessment and Plan:  Intractable nausea, vomiting Unclear etiology. CT abdomen pelvis Showed a cluster of dilated small bowel in the right midabdomen surrounding inflammatory stranding and mesenteric edema, similar to a prior study and likely related to internal hernia. General surgery was consulted due to concern for inguinal hernia being the cause of his symptoms.  Felt unlikely. Small bowel follow-through was done and showed normal passage of contrast to the colon. - If symptoms persist, will consult GI and consider CTA to evaluate vasculature. - Diet resumed.  ESRD on HD MWF Nephrology consulted. - Continue hemodialysis per nephrology. -Continue home sevelamer  Hypokalemia Patient presented with potassium of 2.7.   Likely due to nausea vomiting as well as Lokelma  use. - Hold home Lokelma . - Replete potassium with caution, given ESRD.      Subjective: Patient states he has not vomited since admission to hospital since he has not eaten anything.  Physical Exam: BP (!) 149/103   Pulse (!) 59   Temp (!) 97.5 F (36.4 C) (Oral)   Resp 11   Ht 5' 10 (1.778 m)   Wt 69 kg   SpO2 100%   BMI 21.83 kg/m     General: Alert, oriented X3  Eyes: Pupils equal, reactive  Oral cavity: moist mucous membranes  Head: Atraumatic, normocephalic  Neck: supple  Chest: clear to auscultation. No crackles, no wheezes  CVS: S1,S2 RRR. No murmurs   Abd: No distention, soft, non-tender. No masses palpable  Extr: No edema   MSK: No joint deformities or swelling  Neurological: Grossly intact.    Data Reviewed:    Latest Ref Rng & Units 01/11/2024    5:03 AM 01/09/2024   10:39 PM 08/28/2023    5:20 AM  CBC  WBC 4.0 - 10.5 K/uL 3.0  3.3  3.9   Hemoglobin 13.0 - 17.0 g/dL 9.5  89.2  9.3   Hematocrit 39.0 - 52.0 % 29.2  33.4  28.4   Platelets 150 - 400 K/uL 121  149  143       Latest Ref Rng & Units 01/11/2024    5:03 AM 01/09/2024   10:39 PM 08/28/2023    5:20 AM  BMP  Glucose 70 - 99 mg/dL 76  94  84   BUN 6 - 20 mg/dL 36  24  41   Creatinine 0.61 - 1.24 mg/dL 89.59  1.65  83.73   Sodium 135 - 145 mmol/L 138  139  144   Potassium 3.5 - 5.1 mmol/L 3.0  2.7  4.2   Chloride 98 - 111 mmol/L 97  94  100   CO2 22 - 32 mmol/L 25  29  26    Calcium 8.9 - 10.3 mg/dL 8.8  9.6  9.0      Family Communication: n/a  Disposition: Status is: Inpatient  DVT ppx: SCDs      Author: MDALA-GAUSI,  GOLDEN PILLOW, MD 01/11/2024 1:59 PM  For on call review [email or web address].    "

## 2024-01-11 NOTE — Progress Notes (Signed)
" ° °  Brief Progress Note   _____________________________________________________________________________________________________________  Patient Name: Nathan Castaneda Patient DOB: 1975/11/09 Date: @TODAY @      Data: 49 y.o. male currently awaiting admission to a Progressive bed at [facility].     Action: Reviewed the patient's vital signs, lab results, and clinical notes to assess if patient appropriate to downgrade.    Response:  Pt with ESRD on HD. Due to have HD today. Will reassess potential for downgrade after HD today.  _____________________________________________________________________________________________________________  [facility] RN Expeditor Kelsye Loomer S Marq Rebello Please contact us  directly via secure chat (search for [facility]) or by calling us  at [phone] [facility]).  "

## 2024-01-11 NOTE — ED Notes (Signed)
 Pt changed into hospital gown and non slip socks. Pt transferred to hospital bed with new sheets and given warm blankets

## 2024-01-11 NOTE — ED Notes (Signed)
 Pt ambulated to bathroom

## 2024-01-11 NOTE — ED Notes (Signed)
 Surgeon at bedside.

## 2024-01-11 NOTE — ED Notes (Signed)
 PT is breathing is even and unlabored.  Call light within reach encouraged to use when needs arise.

## 2024-01-12 ENCOUNTER — Inpatient Hospital Stay (HOSPITAL_COMMUNITY)

## 2024-01-12 DIAGNOSIS — R112 Nausea with vomiting, unspecified: Secondary | ICD-10-CM | POA: Diagnosis not present

## 2024-01-12 DIAGNOSIS — Z862 Personal history of diseases of the blood and blood-forming organs and certain disorders involving the immune mechanism: Secondary | ICD-10-CM

## 2024-01-12 DIAGNOSIS — N189 Chronic kidney disease, unspecified: Secondary | ICD-10-CM

## 2024-01-12 DIAGNOSIS — N186 End stage renal disease: Secondary | ICD-10-CM | POA: Diagnosis not present

## 2024-01-12 DIAGNOSIS — Z992 Dependence on renal dialysis: Secondary | ICD-10-CM

## 2024-01-12 DIAGNOSIS — R933 Abnormal findings on diagnostic imaging of other parts of digestive tract: Secondary | ICD-10-CM | POA: Diagnosis not present

## 2024-01-12 DIAGNOSIS — I10 Essential (primary) hypertension: Secondary | ICD-10-CM | POA: Diagnosis not present

## 2024-01-12 DIAGNOSIS — R1084 Generalized abdominal pain: Secondary | ICD-10-CM | POA: Diagnosis not present

## 2024-01-12 LAB — CBC
HCT: 27.2 % — ABNORMAL LOW (ref 39.0–52.0)
Hemoglobin: 9 g/dL — ABNORMAL LOW (ref 13.0–17.0)
MCH: 24.7 pg — ABNORMAL LOW (ref 26.0–34.0)
MCHC: 33.1 g/dL (ref 30.0–36.0)
MCV: 74.5 fL — ABNORMAL LOW (ref 80.0–100.0)
Platelets: 121 K/uL — ABNORMAL LOW (ref 150–400)
RBC: 3.65 MIL/uL — ABNORMAL LOW (ref 4.22–5.81)
RDW: 16 % — ABNORMAL HIGH (ref 11.5–15.5)
WBC: 3.6 K/uL — ABNORMAL LOW (ref 4.0–10.5)
nRBC: 0 % (ref 0.0–0.2)

## 2024-01-12 LAB — RENAL FUNCTION PANEL
Albumin: 3.5 g/dL (ref 3.5–5.0)
Anion gap: 16 — ABNORMAL HIGH (ref 5–15)
BUN: 43 mg/dL — ABNORMAL HIGH (ref 6–20)
CO2: 25 mmol/L (ref 22–32)
Calcium: 8.5 mg/dL — ABNORMAL LOW (ref 8.9–10.3)
Chloride: 96 mmol/L — ABNORMAL LOW (ref 98–111)
Creatinine, Ser: 12.6 mg/dL — ABNORMAL HIGH (ref 0.61–1.24)
GFR, Estimated: 4 mL/min — ABNORMAL LOW
Glucose, Bld: 82 mg/dL (ref 70–99)
Phosphorus: 5 mg/dL — ABNORMAL HIGH (ref 2.5–4.6)
Potassium: 2.9 mmol/L — ABNORMAL LOW (ref 3.5–5.1)
Sodium: 137 mmol/L (ref 135–145)

## 2024-01-12 LAB — HEPATITIS B SURFACE ANTIGEN: Hepatitis B Surface Ag: NONREACTIVE

## 2024-01-12 MED ORDER — HYDROMORPHONE HCL 1 MG/ML IJ SOLN
INTRAMUSCULAR | Status: AC
  Filled 2024-01-12: qty 0.5

## 2024-01-12 MED ORDER — NEPRO/CARBSTEADY PO LIQD: 237.0000 mL | ORAL | Status: DC | PRN

## 2024-01-12 MED ORDER — HEPARIN SODIUM (PORCINE) 1000 UNIT/ML IJ SOLN
INTRAMUSCULAR | Status: AC
  Filled 2024-01-12: qty 5

## 2024-01-12 MED ORDER — HEPARIN SODIUM (PORCINE) 1000 UNIT/ML IJ SOLN
4600.0000 [IU] | Freq: Once | INTRAMUSCULAR | Status: AC
  Administered 2024-01-12: 4600 [IU]

## 2024-01-12 MED ORDER — IOHEXOL 350 MG/ML SOLN
75.0000 mL | Freq: Once | INTRAVENOUS | Status: AC | PRN
  Administered 2024-01-12: 75 mL via INTRAVENOUS

## 2024-01-12 MED ORDER — LIDOCAINE-PRILOCAINE 2.5-2.5 % EX CREA: 1.0000 | TOPICAL_CREAM | CUTANEOUS | Status: DC | PRN

## 2024-01-12 MED ORDER — HEPARIN SODIUM (PORCINE) 1000 UNIT/ML DIALYSIS: 1000.0000 [IU] | INTRAMUSCULAR | Status: DC | PRN

## 2024-01-12 MED ORDER — ALTEPLASE 2 MG IJ SOLR: 2.0000 mg | Freq: Once | INTRAMUSCULAR | Status: DC | PRN

## 2024-01-12 MED ORDER — LIDOCAINE HCL (PF) 1 % IJ SOLN: 5.0000 mL | INTRAMUSCULAR | Status: DC | PRN

## 2024-01-12 MED ORDER — ANTICOAGULANT SODIUM CITRATE 4% (200MG/5ML) IV SOLN: 5.0000 mL | Status: DC | PRN

## 2024-01-12 MED ORDER — PENTAFLUOROPROP-TETRAFLUOROETH EX AERO: 1.0000 | INHALATION_SPRAY | CUTANEOUS | Status: DC | PRN

## 2024-01-12 MED ORDER — POTASSIUM CHLORIDE CRYS ER 20 MEQ PO TBCR
20.0000 meq | EXTENDED_RELEASE_TABLET | Freq: Once | ORAL | Status: AC
  Administered 2024-01-12: 20 meq via ORAL
  Filled 2024-01-12: qty 1

## 2024-01-12 MED ORDER — CHLORHEXIDINE GLUCONATE CLOTH 2 % EX PADS
6.0000 | MEDICATED_PAD | Freq: Every day | CUTANEOUS | Status: DC
  Administered 2024-01-12 – 2024-01-15 (×2): 6 via TOPICAL

## 2024-01-12 NOTE — Progress Notes (Signed)
 "                        PROGRESS NOTE        PATIENT DETAILS Name: Nathan Castaneda Age: 49 y.o. Sex: male Date of Birth: September 08, 1975 Admit Date: 01/09/2024 Admitting Physician Eva KATHEE Pore, DO ERE:[id number], Manuelita LABOR, MD  Brief Summary: Patient is a 49 y.o.  male with history of ESRD on HD-presented with abdominal pain/intractable nausea/vomiting.  Significant events: 1/1>> admit to [facility].  Significant studies: 12/31>> CT abdomen/pelvis: Cluster of dilated small bowel in the right mid abdomen with surrounding inflammatory stranding/mesenteric edema. 01/02>> x-ray of the abdomen: No acute findings  Significant microbiology data: None  Procedures: None  Consults: General surgery  Subjective: Lying comfortably in bed-denies any chest pain or shortness of breath.  Complains of abdominal pain-but this is a chronic issue that apparently has been going on for months.  Appears comfortable-no nausea or vomiting-apparently is tolerating advancement in diet.  Objective: Vitals: Blood pressure (!) 151/97, pulse 77, temperature 98 F (36.7 C), temperature source Oral, resp. rate 11, height 5' 10 (1.778 m), weight 65.1 kg, SpO2 94%.   Exam: Gen Exam:Alert awake-not in any distress HEENT:atraumatic, normocephalic Chest: B/L clear to auscultation anteriorly CVS:S1S2 regular Abdomen: Soft-mild diffuse tenderness in the mid abdominal area. Extremities:no edema Neurology: Non focal Skin: no rash  Pertinent Labs/Radiology:    Latest Ref Rng & Units 01/12/2024    6:23 AM 01/11/2024    5:03 AM 01/09/2024   10:39 PM  CBC  WBC 4.0 - 10.5 K/uL 3.6  3.0  3.3   Hemoglobin 13.0 - 17.0 g/dL 9.0  9.5  89.2   Hematocrit 39.0 - 52.0 % 27.2  29.2  33.4   Platelets 150 - 400 K/uL 121  121  149     Lab Results  Component Value Date   NA 137 01/12/2024   K 2.9 (L) 01/12/2024   CL 96 (L) 01/12/2024   CO2 25 01/12/2024      Assessment/Plan: Intractable nausea/vomiting Acute on chronic  abdominal pain Unclear etiology Symptomatically improved with supportive care Will await general surgical opinion.  ESRD on HD MWF Nephrology consulted 1/3  Microcytic anemia Chronic issue-probably some iron  deficiency and anemia of CKD contributing Defer Aranesp /iron  therapies to nephrology service  Hypokalemia Replete/recheck  Code status:   Code Status: Full Code   DVT Prophylaxis: SCDs Start: 01/10/24 2017   Family Communication: None at bedside   Disposition Plan: Status is: Inpatient Remains inpatient appropriate because: Severity of illness   Planned Discharge Destination:Home   Diet: Diet Order             Diet renal with fluid restriction Fluid restriction: 2000 mL Fluid; Room service appropriate? Yes; Fluid consistency: Thin  Diet effective now                     Antimicrobial agents: Anti-infectives (From admission, onward)    None        MEDICATIONS: Scheduled Meds:  Chlorhexidine  Gluconate Cloth  6 each Topical Q0600   pantoprazole  (PROTONIX ) IV  40 mg Intravenous Q12H   sevelamer  carbonate  1,600 mg Oral TID WC   Continuous Infusions: PRN Meds:.acetaminophen  **OR** acetaminophen , hydrALAZINE , HYDROmorphone  (DILAUDID ) injection, LORazepam , LORazepam , naLOXone  (NARCAN )  injection, trimethobenzamide    I have personally reviewed following labs and imaging studies  LABORATORY DATA: CBC: Recent Labs  Lab 01/09/24 2239 01/11/24 0503 01/12/24 0623  WBC 3.3* 3.0*  3.6*  NEUTROABS 1.7 1.6*  --   HGB 10.7* 9.5* 9.0*  HCT 33.4* 29.2* 27.2*  MCV 77.1* 76.0* 74.5*  PLT 149* 121* 121*    Basic Metabolic Panel: Recent Labs  Lab 01/09/24 2239 01/11/24 0503 01/12/24 0623  NA 139 138 137  K 2.7* 3.0* 2.9*  CL 94* 97* 96*  CO2 29 25 25   GLUCOSE 94 76 82  BUN 24* 36* 43*  CREATININE 8.34* 10.40* 12.60*  CALCIUM 9.6 8.8* 8.5*  MG 1.9 2.0  --   PHOS  --  5.5* 5.0*    GFR: Estimated Creatinine Clearance: 6.7 mL/min (A) (by C-G  formula based on SCr of 12.6 mg/dL (H)).  Liver Function Tests: Recent Labs  Lab 01/09/24 2239 01/11/24 0503 01/12/24 0623  AST 16 15  --   ALT 6 7  --   ALKPHOS 79 74  --   BILITOT 0.8 0.7  --   PROT 8.3* 7.3  --   ALBUMIN 4.1 3.8 3.5   Recent Labs  Lab 01/09/24 2239  LIPASE 153*   No results for input(s): AMMONIA in the last 168 hours.  Coagulation Profile: Recent Labs  Lab 01/11/24 1653  INR 1.0    Cardiac Enzymes: No results for input(s): CKTOTAL, CKMB, CKMBINDEX, TROPONINI in the last 168 hours.  BNP (last 3 results) No results for input(s): PROBNP in the last 8760 hours.  Lipid Profile: No results for input(s): CHOL, HDL, LDLCALC, TRIG, CHOLHDL, LDLDIRECT in the last 72 hours.  Thyroid Function Tests: No results for input(s): TSH, T4TOTAL, FREET4, T3FREE, THYROIDAB in the last 72 hours.  Anemia Panel: No results for input(s): VITAMINB12, FOLATE, FERRITIN, TIBC, IRON , RETICCTPCT in the last 72 hours.  Urine analysis:    Component Value Date/Time   COLORURINE YELLOW 01/09/2024 1224   APPEARANCEUR TURBID (A) 01/09/2024 1224   LABSPEC 1.022 01/09/2024 1224   PHURINE 7.0 01/09/2024 1224   GLUCOSEU NEGATIVE 01/09/2024 1224   HGBUR SMALL (A) 01/09/2024 1224   BILIRUBINUR NEGATIVE 01/09/2024 1224   KETONESUR NEGATIVE 01/09/2024 1224   PROTEINUR >=300 (A) 01/09/2024 1224   NITRITE NEGATIVE 01/09/2024 1224   LEUKOCYTESUR SMALL (A) 01/09/2024 1224    Sepsis Labs: Lactic Acid, Venous    Component Value Date/Time   LATICACIDVEN 1.7 02/21/2022 2300    MICROBIOLOGY: No results found for this or any previous visit (from the past 240 hours).  RADIOLOGY STUDIES/RESULTS: DG Abd Portable 1V-Small Bowel Obstruction Protocol-initial, 8 hr delay Result Date: 01/11/2024 EXAM: 1 VIEW XRAY OF THE ABDOMEN 01/11/2024 02:00:25 AM COMPARISON: 05/16/2022 CLINICAL HISTORY: FINDINGS: LINES, TUBES AND DEVICES: Right femoral  dialysis catheter in place. BOWEL: Gas-filled small bowel loops in left abdomen upper normal in caliber. Contrast material in the ascending colon. Nonobstructive bowel gas pattern. SOFT TISSUES: Aortoiliac calcified atherosclerosis. BONES: No acute fracture. IMPRESSION: 1. No acute findings. Electronically signed by: Franky Stanford MD 01/11/2024 02:23 AM EST RP Workstation: [id number]     LOS: 1 day   Donalda Applebaum, MD  [facility]    To contact the attending provider between 7A-7P or the covering provider during after hours 7P-7A, please log into the web site [email or web address] and access using universal [place] password for that web site. If you do not have the password, please call the hospital operator.  01/12/2024, 9:34 AM    "

## 2024-01-12 NOTE — Consult Note (Addendum)
 Renal Service Consult Note [facility] [name], MD  Patient: Nathan Castaneda Date: 01/12/2024 Requesting Physician: Dr. CANDIE Applebaum  Reason for Consult: ESRD pt w/ abd pain, N/V HPI: The patient is a 49 y.o. year-old w/ PMH as below who presented to ED 01/10/23 c/o abd pain and N/V for the last 4-5 days. Also has had abd pain for about 1 month. In ED pt afeb, HR 70, BP 160/80, RR 20, O2sat 95% on RA. K+ 2.9, creat stab;e. WBC 3.3, Hb 10. CT abd was done and gen surgery consulted, no acute surgical needs. Unsure cause of abd pain. Pt was admitted. We are asked to see for esrd.    Pt seen in room. Denies any severe pain. No SOB, leg swelling. No recent HD issues.    ROS - denies CP, no joint pain, no HA, no blurry vision, no rash, no diarrhea, no nausea/ vomiting   Past Medical History  Past Medical History:  Diagnosis Date   Anemia    Anxiety    History of recent blood transfusion    Hypertension    Lupus    Renal disease    Past Surgical History  Past Surgical History:  Procedure Laterality Date   ARTERY REPAIR Left 07/06/2023   Procedure: PRIMARY END TO END ANASTOMOSIS OF LEFT BRACHIAL ARTERY;  Surgeon: Lanis Fonda BRAVO, MD;  Location: [facility] OR;  Service: Vascular;  Laterality: Left;   AV FISTULA PLACEMENT     ESOPHAGOGASTRODUODENOSCOPY (EGD) WITH PROPOFOL  N/A 04/08/2021   Procedure: ESOPHAGOGASTRODUODENOSCOPY (EGD) WITH PROPOFOL ;  Surgeon: Rollin Dover, MD;  Location: [facility] ENDOSCOPY;  Service: Gastroenterology;  Laterality: N/A;   EXCHANGE OF A DIALYSIS CATHETER Right 07/06/2023   Procedure: EXCHANGE OF RIGHT FEMORAL VEIN DIALYSIS CATHETER;  Surgeon: Lanis Fonda BRAVO, MD;  Location: [facility] OR;  Service: Vascular;  Laterality: Right;   INSERTION OF DIALYSIS CATHETER Right 08/28/2023   Procedure: TUNNELED DIALYSIS CATHETER EXCHANGE;  Surgeon: Pearline Norman RAMAN, MD;  Location: [facility] OR;  Service: Vascular;  Laterality: Right;   IR FLUORO GUIDE CV LINE RIGHT  09/23/2021    IR FLUORO GUIDE CV LINE RIGHT  02/23/2022   PERITONEAL CATHETER INSERTION     REMOVAL OF GRAFT Left 07/06/2023   Procedure: REMOVAL LEFT ARM HERO GRAFT;  Surgeon: Lanis Fonda BRAVO, MD;  Location: [facility] OR;  Service: Vascular;  Laterality: Left;   REVISON OF ARTERIOVENOUS FISTULA Right 09/12/2019   Procedure: 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 artery with reversed saphenous vein from left ankle;  Surgeon: Oris Krystal FALCON, MD;  Location: [facility] OR;  Service: Vascular;  Laterality: Right;   TUNNELLED CATHETER EXCHANGE N/A 07/10/2023   Procedure: TUNNELLED CATHETER EXCHANGE;  Surgeon: Serene Gaile ORN, MD;  Location: [facility] INVASIVE CV LAB;  Service: Cardiovascular;  Laterality: N/A;   TUNNELLED CATHETER EXCHANGE Right 12/27/2023   Procedure: TUNNELLED CATHETER EXCHANGE;  Surgeon: Sheree Penne Bruckner, MD;  Location: [facility] PV LAB;  Service: Cardiovascular;  Laterality: Right;   VEIN HARVEST Left 09/12/2019   Procedure: SAPHENOUS VEIN HARVEST;  Surgeon: Oris Krystal FALCON, MD;  Location: [facility] OR;  Service: Vascular;  Laterality: Left;   VEIN HARVEST Right 07/06/2023   Procedure: RIGHT GREATER SAPHENECTOMY;  Surgeon: Lanis Fonda BRAVO, MD;  Location: [facility] OR;  Service: Vascular;  Laterality: Right;   WOUND DEBRIDEMENT Right 09/26/2019   Procedure: RIGHT UPPER EXTREMITY WOUND WASHOUT;  Surgeon: Oris Krystal FALCON, MD;  Location: [facility] OR;  Service: Vascular;  Laterality: Right;  Family History History reviewed. No pertinent family history. Social History  reports that he has been smoking cigarettes. He has a 7.5 pack-year smoking history. He has never used smokeless tobacco. He reports that he does not drink alcohol and does not use drugs. Allergies Allergies[1] Home medications Prior to Admission medications  Medication Sig Start Date End Date Taking? Authorizing Provider  acetaminophen  (TYLENOL ) 500 MG tablet Take 1,000 mg by mouth 2 (two) times daily as needed for moderate pain (pain score  4-6), fever or headache.   Yes [provider]  ferric citrate  (AURYXIA ) 1 GM 210 MG(Fe) tablet Take 630 mg by mouth in the morning, at noon, and at bedtime.   Yes [provider]  LOKELMA  10 g PACK packet Take 10 g by mouth daily.   Yes [provider]  pantoprazole  (PROTONIX ) 40 MG tablet Take 40 mg by mouth daily.   Yes [provider]  sevelamer  carbonate (RENVELA ) 800 MG tablet 1,600 mg 3 (three) times daily with meals. And snacks 12/18/23 12/17/24 Yes [provider]     Vitals:   01/12/24 0317 01/12/24 0322 01/12/24 0400 01/12/24 0834  BP:  (!) 167/106 (!) 145/97 (!) 151/97  Pulse:  72 73 77  Resp:  12 17 11   Temp:  98.1 F (36.7 C)  98 F (36.7 C)  TempSrc:  Oral  Oral  SpO2:  94% 93% 94%  Weight: 65.1 kg     Height:       Exam Gen alert, no distress Sclera anicteric, throat clear  No jvd or bruits Chest clear bilat to bases RRR no MRG Abd soft ntnd no mass or ascites +bs Ext no LE or UE edema, no other edema Neuro is alert, Ox 3 , nf    R thigh TDC intact  Home bp meds: none   OP HD: MWF [place] Aug 2025 -> 4h  B400  70kg  fem TDC  Heparin  none    Assessment/ Plan: ESRD: on HD MWF. Missed HD yesterday. HD today.  HTN: slightly high, follow.   Volume: euvolemic on exam, not been eating or drinking much. Small UF w/ HD.  Anemia of esrd: Hb 9-11 here, follow. Transfuse prn.  Abd pain/ N/V: per gen surg/ pmd   Myer Fret  MD [facility] 01/12/2024, 8:58 AM  Recent Labs  Lab 01/11/24 0503 01/12/24 0623  HGB 9.5* 9.0*  ALBUMIN 3.8 3.5  CALCIUM 8.8* 8.5*  PHOS 5.5* 5.0*  CREATININE 10.40* 12.60*  K 3.0* 2.9*   Inpatient medications:  Chlorhexidine  Gluconate Cloth  6 each Topical Daily   pantoprazole  (PROTONIX ) IV  40 mg Intravenous Q12H   sevelamer  carbonate  1,600 mg Oral TID WC    acetaminophen  **OR** acetaminophen , hydrALAZINE , HYDROmorphone  (DILAUDID ) injection, LORazepam , LORazepam , naLOXone  (NARCAN )   injection, trimethobenzamide       [1]  Allergies Allergen Reactions   Firvanq  [Vancomycin ] Itching

## 2024-01-12 NOTE — Progress Notes (Signed)
 Received patient in bed to unit.  Alert and oriented.  Informed consent signed and in chart.   TX duration:3 hours  Patient tolerated well.  Transported back to the room  Alert, without acute distress.  Hand-off given to patient's nurse.   Access used: R Femoral HD cath, dressing changed. Access issues: none  Total UF removed: 2L Medication(s) given: Dilaudid    01/12/24 1612  Vitals  Temp 97.8 F (36.6 C)  BP (!) 154/94  Pulse Rate 66  Resp 13  Weight 60.1 kg  Type of Weight Post-Dialysis  Oxygen Therapy  SpO2 96 %  O2 Device Room Air  Patient Activity (if Appropriate) In bed  Pulse Oximetry Type Continuous  Oximetry Probe Site Changed No  Post Treatment  Dialyzer Clearance Lightly streaked  Hemodialysis Intake (mL) 0 mL  Liters Processed 72  Fluid Removed (mL) 2000 mL  Tolerated HD Treatment Yes  Hemodialysis Catheter Right Femoral vein Double lumen Permanent (Tunneled)  Placement Date/Time: 12/27/23 0909   Placed prior to admission: No  Serial / Lot #: [id number]  Expiration Date: 03/08/28  Time Out: Correct patient;Correct site;Correct procedure  Maximum sterile barrier precautions: Hand hygiene;Large sterile sheet;C...  Site Condition No complications  Blue Lumen Status Flushed;Heparin  locked;Antimicrobial dead end cap  Red Lumen Status Flushed;Heparin  locked;Antimicrobial dead end cap  Purple Lumen Status N/A  Catheter fill solution Heparin  1000 units/ml  Catheter fill volume (Arterial) 2.3 cc  Catheter fill volume (Venous) 2.3  Dressing Type Transparent  Dressing Status Antimicrobial disc/dressing in place;Clean, Dry, Intact  Drainage Description None  Dressing Change Due 01/19/24     Camellia Brasil LPN [facility]

## 2024-01-12 NOTE — Progress Notes (Signed)
 "      Subjective: Patient moving his bowels.  No nausea, but tolerating his renal diet yesterday, but had pain following eating.  No pain right now, but hasn't eaten yet this morning.  Not bloated  ROS: See above, otherwise other systems negative  Objective: Vital signs in last 24 hours: Temp:  [97 F (36.1 C)-98.7 F (37.1 C)] 98 F (36.7 C) (01/03 0834) Pulse Rate:  [69-83] 77 (01/03 0834) Resp:  [10-17] 11 (01/03 0834) BP: (145-167)/(83-106) 151/97 (01/03 0834) SpO2:  [93 %-99 %] 94 % (01/03 0834) Weight:  [65.1 kg] 65.1 kg (01/03 0317)    Intake/Output from previous day: No intake/output data recorded. Intake/Output this shift: No intake/output data recorded.  PE: Gen: NAD, sitting on EOB Abd: soft, currently not tender, ND  Lab Results:  Recent Labs    01/11/24 0503 01/12/24 0623  WBC 3.0* 3.6*  HGB 9.5* 9.0*  HCT 29.2* 27.2*  PLT 121* 121*   BMET Recent Labs    01/11/24 0503 01/12/24 0623  NA 138 137  K 3.0* 2.9*  CL 97* 96*  CO2 25 25  GLUCOSE 76 82  BUN 36* 43*  CREATININE 10.40* 12.60*  CALCIUM 8.8* 8.5*   PT/INR Recent Labs    01/11/24 1653  LABPROT 13.9  INR 1.0   CMP     Component Value Date/Time   NA 137 01/12/2024 0623   K 2.9 (L) 01/12/2024 0623   CL 96 (L) 01/12/2024 0623   CO2 25 01/12/2024 0623   GLUCOSE 82 01/12/2024 0623   BUN 43 (H) 01/12/2024 0623   CREATININE 12.60 (H) 01/12/2024 0623   CALCIUM 8.5 (L) 01/12/2024 0623   PROT 7.3 01/11/2024 0503   ALBUMIN 3.5 01/12/2024 0623   AST 15 01/11/2024 0503   ALT 7 01/11/2024 0503   ALKPHOS 74 01/11/2024 0503   BILITOT 0.7 01/11/2024 0503   GFRNONAA 4 (L) 01/12/2024 0623   GFRAA 3 (L) 09/13/2019 0429   Lipase     Component Value Date/Time   LIPASE 153 (H) 01/09/2024 2239       Studies/Results: DG Abd Portable 1V-Small Bowel Obstruction Protocol-initial, 8 hr delay Result Date: 01/11/2024 EXAM: 1 VIEW XRAY OF THE ABDOMEN 01/11/2024 02:00:25 AM COMPARISON:  05/16/2022 CLINICAL HISTORY: FINDINGS: LINES, TUBES AND DEVICES: Right femoral dialysis catheter in place. BOWEL: Gas-filled small bowel loops in left abdomen upper normal in caliber. Contrast material in the ascending colon. Nonobstructive bowel gas pattern. SOFT TISSUES: Aortoiliac calcified atherosclerosis. BONES: No acute fracture. IMPRESSION: 1. No acute findings. Electronically signed by: Franky Stanford MD 01/11/2024 02:23 AM EST RP Workstation: [id number]    Anti-infectives: Anti-infectives (From admission, onward)    None        Assessment/Plan Post prandial abdominal pain -concern for possible SBO on initial imaging, but past protocol with no issues.  Moving his bowels -conts to have post prandial pain.  Would recommend CTA to rule out chronic mesenteric ischemia.  If this is negative, then would consider GI consult for evaluation of abdominal pain -no acute surgical needs at this time -d/w primary service.  We will be available as needed.   FEN - renal diet VTE - heparin  ID - none  ESRD Anemia HTN Lupus   I reviewed hospitalist notes, last 24 h vitals and pain scores, last 48 h intake and output, last 24 h labs and trends, and last 24 h imaging results.   LOS: 1 day    Burnard FORBES Banter , PA-C  [facility] 01/12/2024, 12:36 PM Please see Amion for pager number during day hours 7:00am-4:30pm or 7:00am -11:30am on weekends  "

## 2024-01-12 NOTE — Procedures (Signed)
 S: pt seen in [facility]. Tolerating HD well at this time.   Vitals:   01/12/24 1358 01/12/24 1400 01/12/24 1430 01/12/24 1500  BP:  (!) 191/110 (!) 173/104 (!) 166/104  Pulse: 63 (!) 59 (!) 59 60  Resp: 16 17 10 13   Temp:      TempSrc:      SpO2: 99% 100% 99% 100%  Weight:      Height:        Recent Labs  Lab 01/11/24 0503 01/12/24 0623  HGB 9.5* 9.0*  ALBUMIN 3.8 3.5  CALCIUM 8.8* 8.5*  PHOS 5.5* 5.0*  CREATININE 10.40* 12.60*  K 3.0* 2.9*    Inpatient medications:  Chlorhexidine  Gluconate Cloth  6 each Topical Q0600   heparin  sodium (porcine)  4,600 Units Intracatheter Once   pantoprazole  (PROTONIX ) IV  40 mg Intravenous Q12H   potassium chloride   20 mEq Oral Once   sevelamer  carbonate  1,600 mg Oral TID WC    anticoagulant sodium citrate      acetaminophen  **OR** acetaminophen , alteplase , anticoagulant sodium citrate , feeding supplement (NEPRO CARB STEADY), heparin , hydrALAZINE , HYDROmorphone  (DILAUDID ) injection, lidocaine  (PF), lidocaine -prilocaine , LORazepam , LORazepam , naLOXone  (NARCAN )  injection, pentafluoroprop-tetrafluoroeth, trimethobenzamide   I was present at the procedure, reviewed the HD regimen and made appropriate changes.   Myer Fret MD  [facility] 01/12/2024, 3:26 PM

## 2024-01-13 DIAGNOSIS — R634 Abnormal weight loss: Secondary | ICD-10-CM

## 2024-01-13 DIAGNOSIS — N186 End stage renal disease: Secondary | ICD-10-CM | POA: Diagnosis not present

## 2024-01-13 DIAGNOSIS — I10 Essential (primary) hypertension: Secondary | ICD-10-CM | POA: Diagnosis not present

## 2024-01-13 DIAGNOSIS — R1010 Upper abdominal pain, unspecified: Secondary | ICD-10-CM | POA: Diagnosis not present

## 2024-01-13 DIAGNOSIS — R112 Nausea with vomiting, unspecified: Secondary | ICD-10-CM | POA: Diagnosis not present

## 2024-01-13 DIAGNOSIS — N189 Chronic kidney disease, unspecified: Secondary | ICD-10-CM | POA: Diagnosis not present

## 2024-01-13 DIAGNOSIS — R933 Abnormal findings on diagnostic imaging of other parts of digestive tract: Secondary | ICD-10-CM | POA: Diagnosis not present

## 2024-01-13 LAB — RENAL FUNCTION PANEL
Albumin: 3.8 g/dL (ref 3.5–5.0)
Anion gap: 15 (ref 5–15)
BUN: 37 mg/dL — ABNORMAL HIGH (ref 6–20)
CO2: 25 mmol/L (ref 22–32)
Calcium: 8.8 mg/dL — ABNORMAL LOW (ref 8.9–10.3)
Chloride: 95 mmol/L — ABNORMAL LOW (ref 98–111)
Creatinine, Ser: 10.6 mg/dL — ABNORMAL HIGH (ref 0.61–1.24)
GFR, Estimated: 5 mL/min — ABNORMAL LOW
Glucose, Bld: 82 mg/dL (ref 70–99)
Phosphorus: 4.7 mg/dL — ABNORMAL HIGH (ref 2.5–4.6)
Potassium: 3.6 mmol/L (ref 3.5–5.1)
Sodium: 136 mmol/L (ref 135–145)

## 2024-01-13 LAB — MAGNESIUM: Magnesium: 1.9 mg/dL (ref 1.7–2.4)

## 2024-01-13 MED ORDER — HEPARIN SODIUM (PORCINE) 5000 UNIT/ML IJ SOLN
5000.0000 [IU] | Freq: Three times a day (TID) | INTRAMUSCULAR | Status: DC
  Administered 2024-01-13 – 2024-01-16 (×6): 5000 [IU] via SUBCUTANEOUS
  Filled 2024-01-13 (×6): qty 1

## 2024-01-13 MED ORDER — SUCRALFATE 1 GM/10ML PO SUSP
1.0000 g | Freq: Three times a day (TID) | ORAL | Status: DC
  Administered 2024-01-13 – 2024-01-15 (×6): 1 g via ORAL
  Filled 2024-01-13 (×8): qty 10

## 2024-01-13 MED ORDER — SODIUM CHLORIDE 0.9 % IV SOLN: 12.5000 mg | Freq: Three times a day (TID) | INTRAVENOUS | Status: DC | PRN

## 2024-01-13 MED ORDER — ONDANSETRON HCL 4 MG/2ML IJ SOLN
4.0000 mg | Freq: Four times a day (QID) | INTRAMUSCULAR | Status: DC | PRN
  Administered 2024-01-13 – 2024-01-15 (×2): 4 mg via INTRAVENOUS
  Filled 2024-01-13 (×2): qty 2

## 2024-01-13 NOTE — Consult Note (Addendum)
 "   Consultation  Referring Provider: TRH/Ghimire Primary Care Physician:  Norine Manuelita LABOR, MD Primary Gastroenterologist: Sampson  Reason for Consultation: Severe postprandial abdominal pain  HPI: Nathan Castaneda is a 49 y.o. male with end-stage renal disease on dialysis, history of lupus, hypertension.  Patient has previous history of peritoneal dialysis which was stopped secondary to peritonitis. He was admitted 4 days ago after presenting to the emergency room with progressively severe abdominal pain.  Patient relates that he has had this pain for months, initially it was intermittent would last for a day or 2 then improve and he may go for a week or 2 without any significant symptoms.  Over the past month or so it has gotten progressively worse and to the point where he has symptoms every day and after every meal including liquids and solids.  He describes severe pain in his mid abdomen that starts 5 to 10 minutes after eating and is followed by intense crampy type pain.  He usually vomits because he says this relieves some of the pressure on his abdomen and he will feel a bit better after vomiting though the pain does not completely resolve.  Has not had any hematemesis.  He had been having fairly normal bowel movements until over the past several days as not really eating much.  He relates that he has lost about 20 pounds since onset of symptoms.  Initial workup in the ED with CT abdomen and pelvis without contrast on 01/09/2024 shows a cluster of dilated small bowel in the right mid abdomen with surrounding inflammatory stranding and mesenteric edema bowel loops with air-fluid levels, otherwise small bowel loops and colon are nondilated, no pneumatosis, no ascites bilateral renal atrophy.  He was seen in consultation by surgery and then started on small bowel obstruction protocol. Abdominal films on 01/11/2024 shows no acute findings there were gas-filled loops of small bowel in the left  abdomen  CT angio was done yesterday to rule out any mesenteric vascular issues and he was again noted to have some's mild bowel wall thickening suggestive of enteritis, and persistent cluster of mildly dilated small bowel loops in the right mid abdomen possibly indicating an internal hernia without proximal obstruction  Labs today WBC 3.6/hemoglobin 9/hematocrit 27.2/MCV 74 Platelets 120 Potassium 2.9 BUN 43/creatinine 12.6 Drug screen pending  Lipase was elevated on admit at 153/LFTs normal on admission.   Past Medical History:  Diagnosis Date   Anemia    Anxiety    History of recent blood transfusion    Hypertension    Lupus    Renal disease     Past Surgical History:  Procedure Laterality Date   ARTERY REPAIR Left 07/06/2023   Procedure: PRIMARY END TO END ANASTOMOSIS OF LEFT BRACHIAL ARTERY;  Surgeon: Lanis Fonda BRAVO, MD;  Location: [facility] OR;  Service: Vascular;  Laterality: Left;   AV FISTULA PLACEMENT     ESOPHAGOGASTRODUODENOSCOPY (EGD) WITH PROPOFOL  N/A 04/08/2021   Procedure: ESOPHAGOGASTRODUODENOSCOPY (EGD) WITH PROPOFOL ;  Surgeon: Rollin Dover, MD;  Location: [facility] ENDOSCOPY;  Service: Gastroenterology;  Laterality: N/A;   EXCHANGE OF A DIALYSIS CATHETER Right 07/06/2023   Procedure: EXCHANGE OF RIGHT FEMORAL VEIN DIALYSIS CATHETER;  Surgeon: Lanis Fonda BRAVO, MD;  Location: [facility] OR;  Service: Vascular;  Laterality: Right;   INSERTION OF DIALYSIS CATHETER Right 08/28/2023   Procedure: TUNNELED DIALYSIS CATHETER EXCHANGE;  Surgeon: Pearline Norman RAMAN, MD;  Location: [facility] OR;  Service: Vascular;  Laterality: Right;   IR FLUORO GUIDE  CV LINE RIGHT  09/23/2021   IR FLUORO GUIDE CV LINE RIGHT  02/23/2022   PERITONEAL CATHETER INSERTION     REMOVAL OF GRAFT Left 07/06/2023   Procedure: REMOVAL LEFT ARM HERO GRAFT;  Surgeon: Lanis Fonda BRAVO, MD;  Location: [facility] OR;  Service: Vascular;  Laterality: Left;   REVISON OF ARTERIOVENOUS FISTULA Right 09/12/2019   Procedure: Exploration of  false aneurysm and replacement of right brachial artery with reversed saphenous vein from left ankle;  Surgeon: Oris Krystal FALCON, MD;  Location: [facility] OR;  Service: Vascular;  Laterality: Right;   TUNNELLED CATHETER EXCHANGE N/A 07/10/2023   Procedure: TUNNELLED CATHETER EXCHANGE;  Surgeon: Serene Gaile ORN, MD;  Location: [facility] INVASIVE CV LAB;  Service: Cardiovascular;  Laterality: N/A;   TUNNELLED CATHETER EXCHANGE Right 12/27/2023   Procedure: TUNNELLED CATHETER EXCHANGE;  Surgeon: Sheree Penne Bruckner, MD;  Location: [facility] PV LAB;  Service: Cardiovascular;  Laterality: Right;   VEIN HARVEST Left 09/12/2019   Procedure: SAPHENOUS VEIN HARVEST;  Surgeon: Oris Krystal FALCON, MD;  Location: [facility] OR;  Service: Vascular;  Laterality: Left;   VEIN HARVEST Right 07/06/2023   Procedure: RIGHT GREATER SAPHENECTOMY;  Surgeon: Lanis Fonda BRAVO, MD;  Location: [facility] OR;  Service: Vascular;  Laterality: Right;   WOUND DEBRIDEMENT Right 09/26/2019   Procedure: RIGHT UPPER EXTREMITY WOUND WASHOUT;  Surgeon: Oris Krystal FALCON, MD;  Location: [facility] OR;  Service: Vascular;  Laterality: Right;    Prior to Admission medications  Medication Sig Start Date End Date Taking? Authorizing Provider  acetaminophen  (TYLENOL ) 500 MG tablet Take 1,000 mg by mouth 2 (two) times daily as needed for moderate pain (pain score 4-6), fever or headache.   Yes [provider]  ferric citrate  (AURYXIA ) 1 GM 210 MG(Fe) tablet Take 630 mg by mouth in the morning, at noon, and at bedtime.   Yes [provider]  LOKELMA  10 g PACK packet Take 10 g by mouth daily.   Yes [provider]  pantoprazole  (PROTONIX ) 40 MG tablet Take 40 mg by mouth daily.   Yes [provider]  sevelamer  carbonate (RENVELA ) 800 MG tablet 1,600 mg 3 (three) times daily with meals. And snacks 12/18/23 12/17/24 Yes [provider]    Current Facility-Administered Medications  Medication Dose Route Frequency Provider Last Rate Last Admin    acetaminophen  (TYLENOL ) tablet 650 mg  650 mg Oral Q6H PRN Howerter, Justin B, DO       Or   acetaminophen  (TYLENOL ) suppository 650 mg  650 mg Rectal Q6H PRN Howerter, Justin B, DO       Chlorhexidine  Gluconate Cloth 2 % PADS 6 each  6 each Topical Q0600 Geralynn Charleston, MD   6 each at 01/12/24 1200   heparin  injection 5,000 Units  5,000 Units Subcutaneous Q8H Ghimire, Donalda HERO, MD   5,000 Units at 01/13/24 1308   hydrALAZINE  (APRESOLINE ) injection 10 mg  10 mg Intravenous Q4H PRN Howerter, Justin B, DO   10 mg at 01/11/24 1413   HYDROmorphone  (DILAUDID ) injection 0.5 mg  0.5 mg Intravenous Q2H PRN Howerter, Justin B, DO   0.5 mg at 01/13/24 0756   LORazepam  (ATIVAN ) injection 0.5 mg  0.5 mg Intravenous Q4H PRN Howerter, Justin B, DO   0.5 mg at 01/10/24 2025   LORazepam  (ATIVAN ) tablet 1 mg  1 mg Oral Q6H PRN Mdala-Gausi, Masiku Agatha, MD       naloxone  (NARCAN ) injection 0.4 mg  0.4 mg Intravenous PRN Howerter, Justin B, DO  ondansetron  (ZOFRAN ) injection 4 mg  4 mg Intravenous Q6H PRN Raenelle Donalda HERO, MD   4 mg at 01/13/24 1111   pantoprazole  (PROTONIX ) injection 40 mg  40 mg Intravenous Q12H Howerter, Justin B, DO   40 mg at 01/13/24 0958   promethazine  (PHENERGAN ) 12.5 mg in sodium chloride  0.9 % 50 mL IVPB  12.5 mg Intravenous Q8H PRN Ghimire, Donalda HERO, MD       sucralfate  (CARAFATE ) 1 GM/10ML suspension 1 g  1 g Oral TID WC & HS Ghimire, Donalda HERO, MD   1 g at 01/13/24 1309    Allergies as of 01/09/2024 - Review Complete 01/09/2024  Allergen Reaction Noted   Firvanq  [vancomycin ] Itching 01/18/2018    History reviewed. No pertinent family history.  Social History   Socioeconomic History   Marital status: Single    Spouse name: Not on file   Number of children: Not on file   Years of education: 12   Highest education level: Not on file  Occupational History   Occupation: Disabled  Tobacco Use   Smoking status: Every Day    Current packs/day: 0.50    Average  packs/day: 0.5 packs/day for 15.0 years (7.5 ttl pk-yrs)    Types: Cigarettes   Smokeless tobacco: Never  Vaping Use   Vaping status: Never Used  Substance and Sexual Activity   Alcohol use: Never   Drug use: Never   Sexual activity: Yes    Birth control/protection: Condom  Other Topics Concern   Not on file  Social History Narrative   Not on file   Social Drivers of Health   Tobacco Use: High Risk (01/11/2024)   Patient History    Smoking Tobacco Use: Every Day    Smokeless Tobacco Use: Never    Passive Exposure: Not on file  Financial Resource Strain: Not on file  Food Insecurity: No Food Insecurity (01/11/2024)   Epic    Worried About Programme Researcher, Broadcasting/film/video in the Last Year: Never true    Ran Out of Food in the Last Year: Never true  Transportation Needs: No Transportation Needs (01/11/2024)   Epic    Lack of Transportation (Medical): No    Lack of Transportation (Non-Medical): No  Physical Activity: Not on file  Stress: Not on file  Social Connections: Unknown (01/11/2024)   Social Connection and Isolation Panel    Frequency of Communication with Friends and Family: Not on file    Frequency of Social Gatherings with Friends and Family: Not on file    Attends Religious Services: Not on file    Active Member of Clubs or Organizations: Not on file    Attends Banker Meetings: Not on file    Marital Status: Never married  Intimate Partner Violence: Not At Risk (01/11/2024)   Epic    Fear of Current or Ex-Partner: No    Emotionally Abused: No    Physically Abused: No    Sexually Abused: No  Depression (PHQ2-9): Not on file  Alcohol Screen: Not on file  Housing: Low Risk (01/11/2024)   Epic    Unable to Pay for Housing in the Last Year: No    Number of Times Moved in the Last Year: 0    Homeless in the Last Year: No  Utilities: Not At Risk (01/11/2024)   Epic    Threatened with loss of utilities: No  Health Literacy: Not on file    Review of Systems: Pertinent  positive and negative review  of systems were noted in the above HPI section.  All other review of systems was otherwise negative.   Physical Exam: Vital signs in last 24 hours: Temp:  [97.8 F (36.6 C)-98.2 F (36.8 C)] 98.2 F (36.8 C) (01/04 1205) Pulse Rate:  [60-92] 76 (01/04 1205) Resp:  [11-17] 17 (01/04 1205) BP: (130-178)/(83-104) 157/100 (01/04 1205) SpO2:  [94 %-100 %] 96 % (01/04 1205) Weight:  [60.1 kg-60.6 kg] 60.6 kg (01/04 0500)   General:   Alert,  Well-developed, thin African-American male pleasant and cooperative in NAD uncomfortable appearing Head:  Normocephalic and atraumatic. Eyes:  Sclera clear, no icterus.   Conjunctiva pink. Ears:  Normal auditory acuity. Nose:  No deformity, discharge,  or lesions. Mouth:  No deformity or lesions.   Neck:  Supple; no masses or thyromegaly. Lungs:  Clear throughout to auscultation.   No wheezes, crackles, or rhonchi.  Heart:  Regular rate and rhythm; no murmurs, clicks, rubs,  or gallops. Abdomen: Thin, soft, bowel sounds currently very hyperactive with some rushes he has an area of rounded firmness just to the left of the umbilicus currently, and is tender across the periumbilical area no definite rebound. Rectal: Not done Msk:  Symmetrical without gross deformities. . Pulses:  Normal pulses noted. Extremities:  Without clubbing or edema. Neurologic:  Alert and  oriented x4;  grossly normal neurologically. Skin:  Intact without significant lesions or rashes.. Psych:  Alert and cooperative. Normal mood and affect.  Intake/Output from previous day: 01/03 0701 - 01/04 0700 In: -  Out: 2000  Intake/Output this shift: No intake/output data recorded.  Lab Results: Recent Labs    01/11/24 0503 01/12/24 0623  WBC 3.0* 3.6*  HGB 9.5* 9.0*  HCT 29.2* 27.2*  PLT 121* 121*   BMET Recent Labs    01/11/24 0503 01/12/24 0623 01/13/24 1045  NA 138 137 136  K 3.0* 2.9* 3.6  CL 97* 96* 95*  CO2 25 25 25   GLUCOSE 76  82 82  BUN 36* 43* 37*  CREATININE 10.40* 12.60* 10.60*  CALCIUM 8.8* 8.5* 8.8*   LFT Recent Labs    01/11/24 0503 01/12/24 0623 01/13/24 1045  PROT 7.3  --   --   ALBUMIN 3.8   < > 3.8  AST 15  --   --   ALT 7  --   --   ALKPHOS 74  --   --   BILITOT 0.7  --   --    < > = values in this interval not displayed.   PT/INR Recent Labs    01/11/24 1653  LABPROT 13.9  INR 1.0   Hepatitis Panel Recent Labs    01/12/24 0931  HEPBSAG NON REACTIVE     IMPRESSION:  #30 49 year old male with end-stage renal disease on dialysis-remote peritoneal dialysis stopped secondary to peritonitis  #2 progressively severe postprandial abdominal pain nausea and vomiting over the past 3 to 4 months now to the point of being constant and present with every meal solid or liquid.  Associated 20 pound weight loss  Both noncontrasted CT on admission and then CT angiography yesterday are very concerning for an internal hernia, suspect adhesive disease. Has a cluster of dilated small bowel loops in the right mid abdomen suggestive of internal hernia and some small bowel wall thickening suggestive of enteritis in this area as well.  I think his pain is secondary to the internal hernia with partial obstruction/incarceration postprandially Weight loss secondary to above  Plan; clear liquid diet He does not need endoscopy. Needs surgery to reevaluate and consider exploratory lap Will discuss with surgery and primary team     Amy Esterwood PA-C 01/13/2024, 2:30 PM  I have taken an interval history, thoroughly reviewed the chart and examined the patient. I agree with the Advanced Practitioner's note, impression and recommendations, and have recorded additional findings, impressions and recommendations below. I performed a substantive portion of this encounter (>50% time spent), including a complete performance of the medical decision making.  My additional thoughts are as follows:  Extensive  review performed, I also personally reviewed the images from both CT abdomens this admission.  Abdomen is definitely tender as described above, nondistended, not consistent with perforated viscus In addition to above exam findings, I also note that he has a right femoral dialysis catheter  Several months of progressively worsening postprandial upper abdominal pain leading to food avoidance and about a 20 pound weight loss.  Abnormal area of small bowel persistent on both scans.  No vascular compromise on CTA.  In a patient with previous peritoneal dialysis (now on HD), must be mindful of increased risk of intra-abdominal adhesions.  I think he probably does have an internal hernia as suggested by radiology, and is having intermittent PSBO related to that.  I spoke with Dr. Dann Hummer of the hospitalist service after my consultation and asked their service to kindly reevaluate this patient tomorrow.  No current plans for endoscopic testing  Our service will follow peripherally (Dr. Stacia starting on consult service tomorrow) _________________  This consultation required a moderate degree of medical decision making due to the nature and complexity of the acute condition(s) being evaluated as well as the patient's medical comorbidities.  Nathan Castaneda [phone]     "

## 2024-01-13 NOTE — Plan of Care (Signed)

## 2024-01-13 NOTE — Progress Notes (Signed)
 Per pt there is nothing wrong with his heart and does not need any tele monitor.Education concerning the importance of tele monitor given.MD made aware.Will continue to monitor pt and update accordingly.

## 2024-01-13 NOTE — Progress Notes (Addendum)
 "                        PROGRESS NOTE        PATIENT DETAILS Name: Nathan Castaneda Luck Age: 49 y.o. Sex: male Date of Birth: 1975/01/15 Admit Date: 01/09/2024 Admitting Physician Eva KATHEE Pore, DO ERE:[id number], Manuelita LABOR, MD  Brief Summary: Patient is a 49 y.o.  male with history of ESRD on HD-presented with abdominal pain/intractable nausea/vomiting.  Significant events: 1/1>> admit to [facility].  Significant studies: 12/31>> CT abdomen/pelvis: Cluster of dilated small bowel in the right mid abdomen with surrounding inflammatory stranding/mesenteric edema. 01/02>> x-ray of the abdomen: No acute findings 10/03>> CT angio abdomen/pelvis: No occlusion/hemodynamically significant stenosis-small bowel wall thickening suggesting enteritis without pneumatosis.  Significant microbiology data: None  Procedures: None  Consults: General surgery Renal GI   Subjective: Continues to have diffuse abdominal pain-worse postprandial/eating.  Some intermittent vomiting-but mostly nauseous this morning.  Objective: Vitals: Blood pressure 137/83, pulse 86, temperature 98 F (36.7 C), temperature source Oral, resp. rate 13, height 5' 10 (1.778 m), weight 60.6 kg, SpO2 96%.   Exam: Awake/alert Abdomen: Soft-with diffuse tenderness mostly in the mid abdominal area-no peritoneal signs. Extremities: No edema.  Pertinent Labs/Radiology:    Latest Ref Rng & Units 01/12/2024    6:23 AM 01/11/2024    5:03 AM 01/09/2024   10:39 PM  CBC  WBC 4.0 - 10.5 K/uL 3.6  3.0  3.3   Hemoglobin 13.0 - 17.0 g/dL 9.0  9.5  89.2   Hematocrit 39.0 - 52.0 % 27.2  29.2  33.4   Platelets 150 - 400 K/uL 121  121  149     Lab Results  Component Value Date   NA 137 01/12/2024   K 2.9 (L) 01/12/2024   CL 96 (L) 01/12/2024   CO2 25 01/12/2024      Assessment/Plan: Intractable nausea/vomiting Acute on chronic abdominal pain Unclear etiology-has had diffuse abdominal pain for months per history CT angio  abdomen negative for any signs of chronic mesenteric ischemia Since pain is mostly postprandial-continues in spite of being on PPI-GI consulted on 1/4 Will add Carafate  to see if this helps. Continue as needed antiemetics Downgrade to clear liquids today-as continues to have persistent nausea.  ESRD on HD MWF Nephrology following  Microcytic anemia Chronic issue-probably some iron  deficiency and anemia of CKD contributing Defer Aranesp /iron  therapies to nephrology service  Hypokalemia Await repeat labs  Prolonged QTc Resolved as of EKG on 1/4  Code status:   Code Status: Full Code   DVT Prophylaxis: SCDs Start: 01/10/24 2017   Family Communication: None at bedside   Disposition Plan: Status is: Inpatient Remains inpatient appropriate because: Severity of illness   Planned Discharge Destination:Home   Diet: Diet Order             Diet clear liquid Fluid consistency: Thin  Diet effective now                     Antimicrobial agents: Anti-infectives (From admission, onward)    None        MEDICATIONS: Scheduled Meds:  Chlorhexidine  Gluconate Cloth  6 each Topical Q0600   pantoprazole  (PROTONIX ) IV  40 mg Intravenous Q12H   sucralfate   1 g Oral TID WC & HS   Continuous Infusions:  promethazine  (PHENERGAN ) injection (IM or IVPB)     PRN Meds:.acetaminophen  **OR** acetaminophen , hydrALAZINE , HYDROmorphone  (DILAUDID ) injection, LORazepam , LORazepam , naLOXone  (NARCAN )  injection, ondansetron  (ZOFRAN ) IV, promethazine  (PHENERGAN ) injection (IM or IVPB)   I have personally reviewed following labs and imaging studies  LABORATORY DATA: CBC: Recent Labs  Lab 01/09/24 2239 01/11/24 0503 01/12/24 0623  WBC 3.3* 3.0* 3.6*  NEUTROABS 1.7 1.6*  --   HGB 10.7* 9.5* 9.0*  HCT 33.4* 29.2* 27.2*  MCV 77.1* 76.0* 74.5*  PLT 149* 121* 121*    Basic Metabolic Panel: Recent Labs  Lab 01/09/24 2239 01/11/24 0503 01/12/24 0623  NA 139 138 137  K 2.7*  3.0* 2.9*  CL 94* 97* 96*  CO2 29 25 25   GLUCOSE 94 76 82  BUN 24* 36* 43*  CREATININE 8.34* 10.40* 12.60*  CALCIUM 9.6 8.8* 8.5*  MG 1.9 2.0  --   PHOS  --  5.5* 5.0*    GFR: Estimated Creatinine Clearance: 6.2 mL/min (A) (by C-G formula based on SCr of 12.6 mg/dL (H)).  Liver Function Tests: Recent Labs  Lab 01/09/24 2239 01/11/24 0503 01/12/24 0623  AST 16 15  --   ALT 6 7  --   ALKPHOS 79 74  --   BILITOT 0.8 0.7  --   PROT 8.3* 7.3  --   ALBUMIN 4.1 3.8 3.5   Recent Labs  Lab 01/09/24 2239  LIPASE 153*   No results for input(s): AMMONIA in the last 168 hours.  Coagulation Profile: Recent Labs  Lab 01/11/24 1653  INR 1.0    Cardiac Enzymes: No results for input(s): CKTOTAL, CKMB, CKMBINDEX, TROPONINI in the last 168 hours.  BNP (last 3 results) No results for input(s): PROBNP in the last 8760 hours.  Lipid Profile: No results for input(s): CHOL, HDL, LDLCALC, TRIG, CHOLHDL, LDLDIRECT in the last 72 hours.  Thyroid Function Tests: No results for input(s): TSH, T4TOTAL, FREET4, T3FREE, THYROIDAB in the last 72 hours.  Anemia Panel: No results for input(s): VITAMINB12, FOLATE, FERRITIN, TIBC, IRON , RETICCTPCT in the last 72 hours.  Urine analysis:    Component Value Date/Time   COLORURINE YELLOW 01/09/2024 1224   APPEARANCEUR TURBID (A) 01/09/2024 1224   LABSPEC 1.022 01/09/2024 1224   PHURINE 7.0 01/09/2024 1224   GLUCOSEU NEGATIVE 01/09/2024 1224   HGBUR SMALL (A) 01/09/2024 1224   BILIRUBINUR NEGATIVE 01/09/2024 1224   KETONESUR NEGATIVE 01/09/2024 1224   PROTEINUR >=300 (A) 01/09/2024 1224   NITRITE NEGATIVE 01/09/2024 1224   LEUKOCYTESUR SMALL (A) 01/09/2024 1224    Sepsis Labs: Lactic Acid, Venous    Component Value Date/Time   LATICACIDVEN 1.7 02/21/2022 2300    MICROBIOLOGY: No results found for this or any previous visit (from the past 240 hours).  RADIOLOGY STUDIES/RESULTS: CT  Angio Abd/Pel w/ and/or w/o Result Date: 01/12/2024 EXAM: CTA ABDOMEN AND PELVIS WITH AND WITHOUT CONTRAST 01/12/2024 04:46:20 PM TECHNIQUE: CTA images of the abdomen and pelvis without and with intravenous contrast. Three-dimensional MIP/volume rendered formations were performed. Automated exposure control, iterative reconstruction, and/or weight based adjustment of the mA/kV was utilized to reduce the radiation dose to as low as reasonably achievable. CONTRAST: 75 mL of Omnipaque  350. COMPARISON: CT Abdomen Pelvis 01/09/2024. CLINICAL HISTORY: Mesenteric ischemia, chronic. FINDINGS: VASCULATURE: Multiple collateral vessels demonstrated in the right upper quadrant and right lateral chest wall and flank region. Right femoral central venous catheter is present with tip in the inferior vena cava. There appears to be a vascular graft in the left femoral region. No intraluminal contrast extravasation is identified. Mesenteric vessels appear patent. AORTA: Prominent calcification of the abdominal aorta without aneurysm  or dissection. CELIAC TRUNK: No acute finding. No occlusion or significant stenosis. SUPERIOR MESENTERIC ARTERY: No acute finding. No occlusion or significant stenosis. RENAL ARTERIES: No acute finding. No occlusion or significant stenosis. ILIAC ARTERIES: No acute finding. No occlusion or significant stenosis. LIVER: The liver is unremarkable. GALLBLADDER AND BILE DUCTS: Gallbladder is unremarkable. No biliary ductal dilatation. SPLEEN: The spleen is unremarkable. PANCREAS: The pancreas is unremarkable. ADRENAL GLANDS: Bilateral adrenal glands are unremarkable. KIDNEYS, URETERS AND BLADDER: Bilateral renal parenchymal atrophy with multiple parenchymal cysts bilaterally. Some of the cysts have thin calcified walls. Appearances are similar to the prior study. No imaging follow-up is indicated. No stones in the kidneys or ureters. No hydronephrosis. No perinephric or periureteral stranding. Urinary bladder  is unremarkable. GI AND BOWEL: The stomach, small bowel, and colon are not abnormally distended. There is evidence of small bowel wall thickening suggesting enteritis. This could be infectious or inflammatory. Ischemic process is not excluded, but no pneumatosis is demonstrated that would provide evidence of ischemia. A cluster of mildly dilated small bowel loops in the right mid-abdomen is again demonstrated without change since the prior study, possibly indicating an internal hernia. No proximal obstruction. REPRODUCTIVE: Mild prostate enlargement. PERITONEUM AND RETROPERITONEUM: No ascites or free air. Retroperitoneal lymph nodes are moderately prominent, measuring up to 10 mm short axis dimension. Nonspecific but likely reactive. LUNG BASE: Mild scarring or linear atelectasis in the lung bases. Calcified pleural plaques on the right. LYMPH NODES: Retroperitoneal lymph nodes are moderately prominent, measuring up to 10 mm short axis dimension. Nonspecific but likely reactive. BONES AND SOFT TISSUES: In the femoral head suggesting avascular necrosis. Increased bone mineral density may indicate renal osteodystrophy. No acute soft tissue abnormality. IMPRESSION: 1. No occlusion or hemodynamically significant stenosis of the arterial system of the abdomen or pelvis, with patent mesenteric vessels and no aortic dissection or abdominal aortic aneurysm. 2. Small bowel wall thickening suggesting enteritis, without pneumatosis. 3. Cluster of mildly dilated small bowel loops in the right mid-abdomen, possibly indicating an internal hernia, without proximal obstruction and unchanged since the prior study. Electronically signed by: Elsie Gravely MD 01/12/2024 08:27 PM EST RP Workstation: [id number]     LOS: 2 days   Donalda Applebaum, MD  [facility]    To contact the attending provider between 7A-7P or the covering provider during after hours 7P-7A, please log into the web site [email or web address] and access  using universal [place] password for that web site. If you do not have the password, please call the hospital operator.  01/13/2024, 10:51 AM    "

## 2024-01-13 NOTE — Progress Notes (Addendum)
 " Nathan Castaneda Progress Note   Subjective:   Seen in room - still having some abd pain. S/p HD yesterday with 2L off. CTA abd without mesenteric ischemia changes. Repeat K pending for this AM.  Objective Vitals:   01/12/24 2343 01/13/24 0410 01/13/24 0500 01/13/24 0822  BP: (!) 178/103 (!) 142/100  137/83  Pulse: 67 92  86  Resp:  16  13  Temp: 98.1 F (36.7 C) 98.1 F (36.7 C)  98 F (36.7 C)  TempSrc: Oral Oral  Oral  SpO2:      Weight:   60.6 kg   Height:       Physical Exam General: Thin, but well appearing man, NAD. Room air Heart: RRR Lungs: CTAB Abdomen: soft, non-tender Extremities: no LE edema Dialysis Access:  R femoral [facility]  Additional Objective Labs: Basic Metabolic Panel: Recent Labs  Lab 01/09/24 2239 01/11/24 0503 01/12/24 0623  NA 139 138 137  K 2.7* 3.0* 2.9*  CL 94* 97* 96*  CO2 29 25 25   GLUCOSE 94 76 82  BUN 24* 36* 43*  CREATININE 8.34* 10.40* 12.60*  CALCIUM 9.6 8.8* 8.5*  PHOS  --  5.5* 5.0*   Liver Function Tests: Recent Labs  Lab 01/09/24 2239 01/11/24 0503 01/12/24 0623  AST 16 15  --   ALT 6 7  --   ALKPHOS 79 74  --   BILITOT 0.8 0.7  --   PROT 8.3* 7.3  --   ALBUMIN 4.1 3.8 3.5   Recent Labs  Lab 01/09/24 2239  LIPASE 153*   CBC: Recent Labs  Lab 01/09/24 2239 01/11/24 0503 01/12/24 0623  WBC 3.3* 3.0* 3.6*  NEUTROABS 1.7 1.6*  --   HGB 10.7* 9.5* 9.0*  HCT 33.4* 29.2* 27.2*  MCV 77.1* 76.0* 74.5*  PLT 149* 121* 121*   Studies/Results: CT Angio Abd/Pel w/ and/or w/o Result Date: 01/12/2024 EXAM: CTA ABDOMEN AND PELVIS WITH AND WITHOUT CONTRAST 01/12/2024 04:46:20 PM TECHNIQUE: CTA images of the abdomen and pelvis without and with intravenous contrast. Three-dimensional MIP/volume rendered formations were performed. Automated exposure control, iterative reconstruction, and/or weight based adjustment of the mA/kV was utilized to reduce the radiation dose to as low as reasonably achievable. CONTRAST:  75 mL of Omnipaque  350. COMPARISON: CT Abdomen Pelvis 01/09/2024. CLINICAL HISTORY: Mesenteric ischemia, chronic. FINDINGS: VASCULATURE: Multiple collateral vessels demonstrated in the right upper quadrant and right lateral chest wall and flank region. Right femoral central venous catheter is present with tip in the inferior vena cava. There appears to be a vascular graft in the left femoral region. No intraluminal contrast extravasation is identified. Mesenteric vessels appear patent. AORTA: Prominent calcification of the abdominal aorta without aneurysm or dissection. CELIAC TRUNK: No acute finding. No occlusion or significant stenosis. SUPERIOR MESENTERIC ARTERY: No acute finding. No occlusion or significant stenosis. RENAL ARTERIES: No acute finding. No occlusion or significant stenosis. ILIAC ARTERIES: No acute finding. No occlusion or significant stenosis. LIVER: The liver is unremarkable. GALLBLADDER AND BILE DUCTS: Gallbladder is unremarkable. No biliary ductal dilatation. SPLEEN: The spleen is unremarkable. PANCREAS: The pancreas is unremarkable. ADRENAL GLANDS: Bilateral adrenal glands are unremarkable. KIDNEYS, URETERS AND BLADDER: Bilateral renal parenchymal atrophy with multiple parenchymal cysts bilaterally. Some of the cysts have thin calcified walls. Appearances are similar to the prior study. No imaging follow-up is indicated. No stones in the kidneys or ureters. No hydronephrosis. No perinephric or periureteral stranding. Urinary bladder is unremarkable. GI AND BOWEL: The stomach, small bowel, and  colon are not abnormally distended. There is evidence of small bowel wall thickening suggesting enteritis. This could be infectious or inflammatory. Ischemic process is not excluded, but no pneumatosis is demonstrated that would provide evidence of ischemia. A cluster of mildly dilated small bowel loops in the right mid-abdomen is again demonstrated without change since the prior study, possibly  indicating an internal hernia. No proximal obstruction. REPRODUCTIVE: Mild prostate enlargement. PERITONEUM AND RETROPERITONEUM: No ascites or free air. Retroperitoneal lymph nodes are moderately prominent, measuring up to 10 mm short axis dimension. Nonspecific but likely reactive. LUNG BASE: Mild scarring or linear atelectasis in the lung bases. Calcified pleural plaques on the right. LYMPH NODES: Retroperitoneal lymph nodes are moderately prominent, measuring up to 10 mm short axis dimension. Nonspecific but likely reactive. BONES AND SOFT TISSUES: In the femoral head suggesting avascular necrosis. Increased bone mineral density may indicate renal osteodystrophy. No acute soft tissue abnormality. IMPRESSION: 1. No occlusion or hemodynamically significant stenosis of the arterial system of the abdomen or pelvis, with patent mesenteric vessels and no aortic dissection or abdominal aortic aneurysm. 2. Small bowel wall thickening suggesting enteritis, without pneumatosis. 3. Cluster of mildly dilated small bowel loops in the right mid-abdomen, possibly indicating an internal hernia, without proximal obstruction and unchanged since the prior study. Electronically signed by: Elsie Gravely MD 01/12/2024 08:27 PM EST RP Workstation: [id number]   Medications:  promethazine  (PHENERGAN ) injection (IM or IVPB)      Chlorhexidine  Gluconate Cloth  6 each Topical Q0600   pantoprazole  (PROTONIX ) IV  40 mg Intravenous Q12H   sevelamer  carbonate  1,600 mg Oral TID WC   sucralfate   1 g Oral TID WC & HS   Dialysis Orders MWF - [place] 4:15hr, 400/800, EDW 68.6kg, 1K/2.5Ca bath, TDC, no heparin  - Last K 3.6 on 12/30, but previously high. Last HD 12/30 - left 63.8kg - Micera 200mcg IV q 2 weeks (last 12/28) - venofer 50mg  weekly (last 12/30) - Hectoral 5mcg IV q HD  Assessment/Plan: Abd pain: Hx SBO in past: Moving bowels, will hold sevelamer . ?Pancreatitis. On short course sucralfate . Hypokalemia: Prev high K  on home Lokelma  and 1K bath with HD - d/c Lokelma , s/p K here and used higher K bath with HD - repeat pending. Will need to change outpatient bath as well. ESRD: Next HD tomorrow 1/5 - using R femoral TDC HTN/volume: BP good, below prior EDW. Anemia of ESRD: Hgb 9 - not due for ESA Secondary HPTH: Ca/Phos ok - hold sevelamer , likely needs alt binder given Hx SBO.  Nutrition: Alb decent, start prot supps. SLE   Izetta Boehringer, PA-C 01/13/2024, 10:37 AM  [facility]    "

## 2024-01-14 DIAGNOSIS — R112 Nausea with vomiting, unspecified: Secondary | ICD-10-CM | POA: Diagnosis not present

## 2024-01-14 DIAGNOSIS — D61818 Other pancytopenia: Secondary | ICD-10-CM | POA: Diagnosis not present

## 2024-01-14 DIAGNOSIS — I120 Hypertensive chronic kidney disease with stage 5 chronic kidney disease or end stage renal disease: Secondary | ICD-10-CM

## 2024-01-14 DIAGNOSIS — I10 Essential (primary) hypertension: Secondary | ICD-10-CM | POA: Diagnosis not present

## 2024-01-14 DIAGNOSIS — N186 End stage renal disease: Secondary | ICD-10-CM | POA: Diagnosis not present

## 2024-01-14 DIAGNOSIS — R933 Abnormal findings on diagnostic imaging of other parts of digestive tract: Secondary | ICD-10-CM | POA: Diagnosis not present

## 2024-01-14 DIAGNOSIS — N189 Chronic kidney disease, unspecified: Secondary | ICD-10-CM | POA: Diagnosis not present

## 2024-01-14 DIAGNOSIS — Z992 Dependence on renal dialysis: Secondary | ICD-10-CM | POA: Diagnosis not present

## 2024-01-14 DIAGNOSIS — R1084 Generalized abdominal pain: Secondary | ICD-10-CM | POA: Diagnosis not present

## 2024-01-14 LAB — RENAL FUNCTION PANEL
Albumin: 3.6 g/dL (ref 3.5–5.0)
Anion gap: 14 (ref 5–15)
BUN: 43 mg/dL — ABNORMAL HIGH (ref 6–20)
CO2: 26 mmol/L (ref 22–32)
Calcium: 8.9 mg/dL (ref 8.9–10.3)
Chloride: 94 mmol/L — ABNORMAL LOW (ref 98–111)
Creatinine, Ser: 12.1 mg/dL — ABNORMAL HIGH (ref 0.61–1.24)
GFR, Estimated: 5 mL/min — ABNORMAL LOW
Glucose, Bld: 76 mg/dL (ref 70–99)
Phosphorus: 5.6 mg/dL — ABNORMAL HIGH (ref 2.5–4.6)
Potassium: 3.8 mmol/L (ref 3.5–5.1)
Sodium: 134 mmol/L — ABNORMAL LOW (ref 135–145)

## 2024-01-14 LAB — MAGNESIUM: Magnesium: 1.9 mg/dL (ref 1.7–2.4)

## 2024-01-14 NOTE — Progress Notes (Signed)
 "                        PROGRESS NOTE        PATIENT DETAILS Name: Nathan Castaneda Age: 49 y.o. Sex: male Date of Birth: 07-14-1975 Admit Date: 01/09/2024 Admitting Physician Eva KATHEE Pore, DO ERE:[id number], Nathan LABOR, MD  Brief Summary: Patient is a 49 y.o.  male with history of ESRD on HD-presented with abdominal pain/intractable nausea/vomiting.  Significant events: 1/1>> admit to [facility].  Significant studies: 12/31>> CT abdomen/pelvis: Cluster of dilated small bowel in the right mid abdomen with surrounding inflammatory stranding/mesenteric edema. 01/02>> x-ray of the abdomen: No acute findings 10/03>> CT angio abdomen/pelvis: No occlusion/hemodynamically significant stenosis-small bowel wall thickening suggesting enteritis without pneumatosis.  Significant microbiology data: None  Procedures: None  Consults: General surgery Renal GI  Subjective: Continues to have intermittent abdominal pain-numerous episodes of nausea-but no vomiting this morning.  Objective: Vitals: Blood pressure (!) 156/99, pulse 78, temperature 98.4 F (36.9 C), temperature source Oral, resp. rate 18, height 5' 10 (1.778 m), weight 60.6 kg, SpO2 96%.   Exam: Awake alert Abdomen: Soft-tenderness in the mid abdominal area without any peritoneal signs.  Pertinent Labs/Radiology:    Latest Ref Rng & Units 01/12/2024    6:23 AM 01/11/2024    5:03 AM 01/09/2024   10:39 PM  CBC  WBC 4.0 - 10.5 K/uL 3.6  3.0  3.3   Hemoglobin 13.0 - 17.0 g/dL 9.0  9.5  89.2   Hematocrit 39.0 - 52.0 % 27.2  29.2  33.4   Platelets 150 - 400 K/uL 121  121  149     Lab Results  Component Value Date   NA 134 (L) 01/14/2024   K 3.8 01/14/2024   CL 94 (L) 01/14/2024   CO2 26 01/14/2024      Assessment/Plan: Intractable nausea/vomiting Acute on chronic abdominal pain Unclear etiology-has had diffuse abdominal pain for months per history CT angio abdomen negative for any signs of chronic mesenteric  ischemia Per GI-no need for EGD-they think this is probably PSBO in the setting of internal hernia Continue PPI/Carafate  but continues to have pain General surgery to reevaluate today Continue clear liquids.  ESRD on HD MWF Nephrology following  Microcytic anemia Chronic issue-probably some iron  deficiency and anemia of CKD contributing Defer Aranesp /iron  therapies to nephrology service  Hypokalemia Await repeat labs  Prolonged QTc Resolved as of EKG on 1/4  Code status:   Code Status: Full Code   DVT Prophylaxis: heparin  injection 5,000 Units Start: 01/13/24 1400 SCDs Start: 01/10/24 2017   Family Communication: None at bedside   Disposition Plan: Status is: Inpatient Remains inpatient appropriate because: Severity of illness   Planned Discharge Destination:Home   Diet: Diet Order             Diet clear liquid Fluid consistency: Thin; Fluid restriction: 2000 mL Fluid  Diet effective now                     Antimicrobial agents: Anti-infectives (From admission, onward)    None        MEDICATIONS: Scheduled Meds:  Chlorhexidine  Gluconate Cloth  6 each Topical Q0600   heparin  injection (subcutaneous)  5,000 Units Subcutaneous Q8H   pantoprazole  (PROTONIX ) IV  40 mg Intravenous Q12H   sucralfate   1 g Oral TID WC & HS   Continuous Infusions:  promethazine  (PHENERGAN ) injection (IM or IVPB)     PRN  Meds:.acetaminophen  **OR** acetaminophen , hydrALAZINE , HYDROmorphone  (DILAUDID ) injection, LORazepam , LORazepam , naLOXone  (NARCAN )  injection, ondansetron  (ZOFRAN ) IV, promethazine  (PHENERGAN ) injection (IM or IVPB)   I have personally reviewed following labs and imaging studies  LABORATORY DATA: CBC: Recent Labs  Lab 01/09/24 2239 01/11/24 0503 01/12/24 0623  WBC 3.3* 3.0* 3.6*  NEUTROABS 1.7 1.6*  --   HGB 10.7* 9.5* 9.0*  HCT 33.4* 29.2* 27.2*  MCV 77.1* 76.0* 74.5*  PLT 149* 121* 121*    Basic Metabolic Panel: Recent Labs  Lab  01/09/24 2239 01/11/24 0503 01/12/24 0623 01/13/24 1045 01/14/24 0303  NA 139 138 137 136 134*  K 2.7* 3.0* 2.9* 3.6 3.8  CL 94* 97* 96* 95* 94*  CO2 29 25 25 25 26   GLUCOSE 94 76 82 82 76  BUN 24* 36* 43* 37* 43*  CREATININE 8.34* 10.40* 12.60* 10.60* 12.10*  CALCIUM 9.6 8.8* 8.5* 8.8* 8.9  MG 1.9 2.0  --  1.9 1.9  PHOS  --  5.5* 5.0* 4.7* 5.6*    GFR: Estimated Creatinine Clearance: 6.5 mL/min (A) (by C-G formula based on SCr of 12.1 mg/dL (H)).  Liver Function Tests: Recent Labs  Lab 01/09/24 2239 01/11/24 0503 01/12/24 0623 01/13/24 1045 01/14/24 0303  AST 16 15  --   --   --   ALT 6 7  --   --   --   ALKPHOS 79 74  --   --   --   BILITOT 0.8 0.7  --   --   --   PROT 8.3* 7.3  --   --   --   ALBUMIN 4.1 3.8 3.5 3.8 3.6   Recent Labs  Lab 01/09/24 2239  LIPASE 153*   No results for input(s): AMMONIA in the last 168 hours.  Coagulation Profile: Recent Labs  Lab 01/11/24 1653  INR 1.0    Cardiac Enzymes: No results for input(s): CKTOTAL, CKMB, CKMBINDEX, TROPONINI in the last 168 hours.  BNP (last 3 results) No results for input(s): PROBNP in the last 8760 hours.  Lipid Profile: No results for input(s): CHOL, HDL, LDLCALC, TRIG, CHOLHDL, LDLDIRECT in the last 72 hours.  Thyroid Function Tests: No results for input(s): TSH, T4TOTAL, FREET4, T3FREE, THYROIDAB in the last 72 hours.  Anemia Panel: No results for input(s): VITAMINB12, FOLATE, FERRITIN, TIBC, IRON , RETICCTPCT in the last 72 hours.  Urine analysis:    Component Value Date/Time   COLORURINE YELLOW 01/09/2024 1224   APPEARANCEUR TURBID (A) 01/09/2024 1224   LABSPEC 1.022 01/09/2024 1224   PHURINE 7.0 01/09/2024 1224   GLUCOSEU NEGATIVE 01/09/2024 1224   HGBUR SMALL (A) 01/09/2024 1224   BILIRUBINUR NEGATIVE 01/09/2024 1224   KETONESUR NEGATIVE 01/09/2024 1224   PROTEINUR >=300 (A) 01/09/2024 1224   NITRITE NEGATIVE 01/09/2024 1224    LEUKOCYTESUR SMALL (A) 01/09/2024 1224    Sepsis Labs: Lactic Acid, Venous    Component Value Date/Time   LATICACIDVEN 1.7 02/21/2022 2300    MICROBIOLOGY: No results found for this or any previous visit (from the past 240 hours).  RADIOLOGY STUDIES/RESULTS: CT Angio Abd/Pel w/ and/or w/o Result Date: 01/12/2024 EXAM: CTA ABDOMEN AND PELVIS WITH AND WITHOUT CONTRAST 01/12/2024 04:46:20 PM TECHNIQUE: CTA images of the abdomen and pelvis without and with intravenous contrast. Three-dimensional MIP/volume rendered formations were performed. Automated exposure control, iterative reconstruction, and/or weight based adjustment of the mA/kV was utilized to reduce the radiation dose to as low as reasonably achievable. CONTRAST: 75 mL of Omnipaque  350. COMPARISON: CT  Abdomen Pelvis 01/09/2024. CLINICAL HISTORY: Mesenteric ischemia, chronic. FINDINGS: VASCULATURE: Multiple collateral vessels demonstrated in the right upper quadrant and right lateral chest wall and flank region. Right femoral central venous catheter is present with tip in the inferior vena cava. There appears to be a vascular graft in the left femoral region. No intraluminal contrast extravasation is identified. Mesenteric vessels appear patent. AORTA: Prominent calcification of the abdominal aorta without aneurysm or dissection. CELIAC TRUNK: No acute finding. No occlusion or significant stenosis. SUPERIOR MESENTERIC ARTERY: No acute finding. No occlusion or significant stenosis. RENAL ARTERIES: No acute finding. No occlusion or significant stenosis. ILIAC ARTERIES: No acute finding. No occlusion or significant stenosis. LIVER: The liver is unremarkable. GALLBLADDER AND BILE DUCTS: Gallbladder is unremarkable. No biliary ductal dilatation. SPLEEN: The spleen is unremarkable. PANCREAS: The pancreas is unremarkable. ADRENAL GLANDS: Bilateral adrenal glands are unremarkable. KIDNEYS, URETERS AND BLADDER: Bilateral renal parenchymal atrophy with  multiple parenchymal cysts bilaterally. Some of the cysts have thin calcified walls. Appearances are similar to the prior study. No imaging follow-up is indicated. No stones in the kidneys or ureters. No hydronephrosis. No perinephric or periureteral stranding. Urinary bladder is unremarkable. GI AND BOWEL: The stomach, small bowel, and colon are not abnormally distended. There is evidence of small bowel wall thickening suggesting enteritis. This could be infectious or inflammatory. Ischemic process is not excluded, but no pneumatosis is demonstrated that would provide evidence of ischemia. A cluster of mildly dilated small bowel loops in the right mid-abdomen is again demonstrated without change since the prior study, possibly indicating an internal hernia. No proximal obstruction. REPRODUCTIVE: Mild prostate enlargement. PERITONEUM AND RETROPERITONEUM: No ascites or free air. Retroperitoneal lymph nodes are moderately prominent, measuring up to 10 mm short axis dimension. Nonspecific but likely reactive. LUNG BASE: Mild scarring or linear atelectasis in the lung bases. Calcified pleural plaques on the right. LYMPH NODES: Retroperitoneal lymph nodes are moderately prominent, measuring up to 10 mm short axis dimension. Nonspecific but likely reactive. BONES AND SOFT TISSUES: In the femoral head suggesting avascular necrosis. Increased bone mineral density may indicate renal osteodystrophy. No acute soft tissue abnormality. IMPRESSION: 1. No occlusion or hemodynamically significant stenosis of the arterial system of the abdomen or pelvis, with patent mesenteric vessels and no aortic dissection or abdominal aortic aneurysm. 2. Small bowel wall thickening suggesting enteritis, without pneumatosis. 3. Cluster of mildly dilated small bowel loops in the right mid-abdomen, possibly indicating an internal hernia, without proximal obstruction and unchanged since the prior study. Electronically signed by: Elsie Gravely MD  01/12/2024 08:27 PM EST RP Workstation: [id number]     LOS: 3 days   Donalda Applebaum, MD  [facility]    To contact the attending provider between 7A-7P or the covering provider during after hours 7P-7A, please log into the web site [email or web address] and access using universal [place] password for that web site. If you do not have the password, please call the hospital operator.  01/14/2024, 11:41 AM    "

## 2024-01-14 NOTE — Progress Notes (Signed)
 "  Progress Note     Subjective: Patient reports continued crampy abdominal pain of the central lower abdomen. Had BM 2 days ago. Having flatulence. Tolerating CLD without nausea or vomiting.   ROS  All negative with the exception of above.  Objective: Vital signs in last 24 hours: Temp:  [98.2 F (36.8 C)-98.5 F (36.9 C)] 98.4 F (36.9 C) (01/05 0749) Pulse Rate:  [76-78] 78 (01/05 0414) Resp:  [17-18] 18 (01/05 0414) BP: (131-171)/(88-102) 156/99 (01/05 0749) SpO2:  [96 %] 96 % (01/04 1205)    Intake/Output from previous day: No intake/output data recorded. Intake/Output this shift: No intake/output data recorded.  PE: General: Pleasant male who is laying in bed in NAD. HEENT: Head is normocephalic, atraumatic.  Heart: HR normal during encounter.  Lungs: Respiratory effort nonlabored. Abd: Soft, ND. Mild tenderness to palpation of central lower abdomen. +BS. No rebound tenderness or guarding.  MS: Able to move all 4 extremities.  Skin: Warm and dry.    Lab Results:  Recent Labs    01/12/24 0623  WBC 3.6*  HGB 9.0*  HCT 27.2*  PLT 121*   BMET Recent Labs    01/13/24 1045 01/14/24 0303  NA 136 134*  K 3.6 3.8  CL 95* 94*  CO2 25 26  GLUCOSE 82 76  BUN 37* 43*  CREATININE 10.60* 12.10*  CALCIUM 8.8* 8.9   PT/INR Recent Labs    01/11/24 1653  LABPROT 13.9  INR 1.0   CMP     Component Value Date/Time   NA 134 (L) 01/14/2024 0303   K 3.8 01/14/2024 0303   CL 94 (L) 01/14/2024 0303   CO2 26 01/14/2024 0303   GLUCOSE 76 01/14/2024 0303   BUN 43 (H) 01/14/2024 0303   CREATININE 12.10 (H) 01/14/2024 0303   CALCIUM 8.9 01/14/2024 0303   PROT 7.3 01/11/2024 0503   ALBUMIN 3.6 01/14/2024 0303   AST 15 01/11/2024 0503   ALT 7 01/11/2024 0503   ALKPHOS 74 01/11/2024 0503   BILITOT 0.7 01/11/2024 0503   GFRNONAA 5 (L) 01/14/2024 0303   GFRAA 3 (L) 09/13/2019 0429   Lipase     Component Value Date/Time   LIPASE 153 (H) 01/09/2024 2239        Studies/Results: CT Angio Abd/Pel w/ and/or w/o Result Date: 01/12/2024 EXAM: CTA ABDOMEN AND PELVIS WITH AND WITHOUT CONTRAST 01/12/2024 04:46:20 PM TECHNIQUE: CTA images of the abdomen and pelvis without and with intravenous contrast. Three-dimensional MIP/volume rendered formations were performed. Automated exposure control, iterative reconstruction, and/or weight based adjustment of the mA/kV was utilized to reduce the radiation dose to as low as reasonably achievable. CONTRAST: 75 mL of Omnipaque  350. COMPARISON: CT Abdomen Pelvis 01/09/2024. CLINICAL HISTORY: Mesenteric ischemia, chronic. FINDINGS: VASCULATURE: Multiple collateral vessels demonstrated in the right upper quadrant and right lateral chest wall and flank region. Right femoral central venous catheter is present with tip in the inferior vena cava. There appears to be a vascular graft in the left femoral region. No intraluminal contrast extravasation is identified. Mesenteric vessels appear patent. AORTA: Prominent calcification of the abdominal aorta without aneurysm or dissection. CELIAC TRUNK: No acute finding. No occlusion or significant stenosis. SUPERIOR MESENTERIC ARTERY: No acute finding. No occlusion or significant stenosis. RENAL ARTERIES: No acute finding. No occlusion or significant stenosis. ILIAC ARTERIES: No acute finding. No occlusion or significant stenosis. LIVER: The liver is unremarkable. GALLBLADDER AND BILE DUCTS: Gallbladder is unremarkable. No biliary ductal dilatation. SPLEEN: The spleen is unremarkable.  PANCREAS: The pancreas is unremarkable. ADRENAL GLANDS: Bilateral adrenal glands are unremarkable. KIDNEYS, URETERS AND BLADDER: Bilateral renal parenchymal atrophy with multiple parenchymal cysts bilaterally. Some of the cysts have thin calcified walls. Appearances are similar to the prior study. No imaging follow-up is indicated. No stones in the kidneys or ureters. No hydronephrosis. No perinephric or  periureteral stranding. Urinary bladder is unremarkable. GI AND BOWEL: The stomach, small bowel, and colon are not abnormally distended. There is evidence of small bowel wall thickening suggesting enteritis. This could be infectious or inflammatory. Ischemic process is not excluded, but no pneumatosis is demonstrated that would provide evidence of ischemia. A cluster of mildly dilated small bowel loops in the right mid-abdomen is again demonstrated without change since the prior study, possibly indicating an internal hernia. No proximal obstruction. REPRODUCTIVE: Mild prostate enlargement. PERITONEUM AND RETROPERITONEUM: No ascites or free air. Retroperitoneal lymph nodes are moderately prominent, measuring up to 10 mm short axis dimension. Nonspecific but likely reactive. LUNG BASE: Mild scarring or linear atelectasis in the lung bases. Calcified pleural plaques on the right. LYMPH NODES: Retroperitoneal lymph nodes are moderately prominent, measuring up to 10 mm short axis dimension. Nonspecific but likely reactive. BONES AND SOFT TISSUES: In the femoral head suggesting avascular necrosis. Increased bone mineral density may indicate renal osteodystrophy. No acute soft tissue abnormality. IMPRESSION: 1. No occlusion or hemodynamically significant stenosis of the arterial system of the abdomen or pelvis, with patent mesenteric vessels and no aortic dissection or abdominal aortic aneurysm. 2. Small bowel wall thickening suggesting enteritis, without pneumatosis. 3. Cluster of mildly dilated small bowel loops in the right mid-abdomen, possibly indicating an internal hernia, without proximal obstruction and unchanged since the prior study. Electronically signed by: Elsie Gravely MD 01/12/2024 08:27 PM EST RP Workstation: [id number]    Anti-infectives: Anti-infectives (From admission, onward)    None        Assessment/Plan Post prandial abdominal pain -Afebrile. -WBC 3.6 from 1/3; HGB 9.0 from  1/3 -Concern for possible SBO on initial imaging, but past protocol with no issues.  Moving his bowels. -Continues to have post prandial pain.   -CTA completed showing no occlusion or significant stenosis of the arterial system. Cluster of mildly dilated small bowel loops in the right mid-abdomen, possibly indicating an internal hernia, without proximal obstruction and unchanged since the prior study. Will discuss further with attending.  -GI consulted 1/4. They recommend reevaluation per surgery.  -No acute surgical needs at this time.   FEN - CLD; IVF per primary service. VTE - Heparin  ID - None   ESRD Anemia HTN Lupus      LOS: 3 days   I reviewed hospitalist notes, specialist notes, consulting provider notes, nursing notes, last 24 h vitals and pain scores, last 48 h intake and output, last 24 h labs and trends, and last 24 h imaging results.  This care required moderate level of medical decision making.    Marjorie Carlyon Favre, [facility] 01/14/2024, 8:44 AM Please see Amion for pager number during day hours 7:00am-4:30pm  "

## 2024-01-14 NOTE — Plan of Care (Signed)

## 2024-01-14 NOTE — Progress Notes (Signed)
 "  [facility] Progress Note   Subjective:   Seen in room - says abd pain the same. Surgery team following. No CP/dyspnea. For HD later today.  Objective Vitals:   01/13/24 2018 01/14/24 0055 01/14/24 0414 01/14/24 0749  BP: (!) 171/102 (!) 144/88 (!) 131/99 (!) 156/99  Pulse: 78 78 78   Resp: 18 18 18    Temp: 98.3 F (36.8 C) 98.3 F (36.8 C) 98.3 F (36.8 C) 98.4 F (36.9 C)  TempSrc: Oral Oral Oral Oral  SpO2:      Weight:      Height:       Physical Exam General: Thin, but well appearing man, NAD. Room air Heart: RRR Lungs: CTAB Abdomen: soft, non-tender Extremities: no LE edema Dialysis Access:  R femoral [facility]  Additional Objective Labs: Basic Metabolic Panel: Recent Labs  Lab 01/12/24 0623 01/13/24 1045 01/14/24 0303  NA 137 136 134*  K 2.9* 3.6 3.8  CL 96* 95* 94*  CO2 25 25 26   GLUCOSE 82 82 76  BUN 43* 37* 43*  CREATININE 12.60* 10.60* 12.10*  CALCIUM 8.5* 8.8* 8.9  PHOS 5.0* 4.7* 5.6*   Liver Function Tests: Recent Labs  Lab 01/09/24 2239 01/11/24 0503 01/12/24 0623 01/13/24 1045 01/14/24 0303  AST 16 15  --   --   --   ALT 6 7  --   --   --   ALKPHOS 79 74  --   --   --   BILITOT 0.8 0.7  --   --   --   PROT 8.3* 7.3  --   --   --   ALBUMIN 4.1 3.8 3.5 3.8 3.6   Recent Labs  Lab 01/09/24 2239  LIPASE 153*   CBC: Recent Labs  Lab 01/09/24 2239 01/11/24 0503 01/12/24 0623  WBC 3.3* 3.0* 3.6*  NEUTROABS 1.7 1.6*  --   HGB 10.7* 9.5* 9.0*  HCT 33.4* 29.2* 27.2*  MCV 77.1* 76.0* 74.5*  PLT 149* 121* 121*   Studies/Results: CT Angio Abd/Pel w/ and/or w/o Result Date: 01/12/2024 EXAM: CTA ABDOMEN AND PELVIS WITH AND WITHOUT CONTRAST 01/12/2024 04:46:20 PM TECHNIQUE: CTA images of the abdomen and pelvis without and with intravenous contrast. Three-dimensional MIP/volume rendered formations were performed. Automated exposure control, iterative reconstruction, and/or weight based adjustment of the mA/kV was utilized to  reduce the radiation dose to as low as reasonably achievable. CONTRAST: 75 mL of Omnipaque  350. COMPARISON: CT Abdomen Pelvis 01/09/2024. CLINICAL HISTORY: Mesenteric ischemia, chronic. FINDINGS: VASCULATURE: Multiple collateral vessels demonstrated in the right upper quadrant and right lateral chest wall and flank region. Right femoral central venous catheter is present with tip in the inferior vena cava. There appears to be a vascular graft in the left femoral region. No intraluminal contrast extravasation is identified. Mesenteric vessels appear patent. AORTA: Prominent calcification of the abdominal aorta without aneurysm or dissection. CELIAC TRUNK: No acute finding. No occlusion or significant stenosis. SUPERIOR MESENTERIC ARTERY: No acute finding. No occlusion or significant stenosis. RENAL ARTERIES: No acute finding. No occlusion or significant stenosis. ILIAC ARTERIES: No acute finding. No occlusion or significant stenosis. LIVER: The liver is unremarkable. GALLBLADDER AND BILE DUCTS: Gallbladder is unremarkable. No biliary ductal dilatation. SPLEEN: The spleen is unremarkable. PANCREAS: The pancreas is unremarkable. ADRENAL GLANDS: Bilateral adrenal glands are unremarkable. KIDNEYS, URETERS AND BLADDER: Bilateral renal parenchymal atrophy with multiple parenchymal cysts bilaterally. Some of the cysts have thin calcified walls. Appearances are similar to the prior study. No imaging  follow-up is indicated. No stones in the kidneys or ureters. No hydronephrosis. No perinephric or periureteral stranding. Urinary bladder is unremarkable. GI AND BOWEL: The stomach, small bowel, and colon are not abnormally distended. There is evidence of small bowel wall thickening suggesting enteritis. This could be infectious or inflammatory. Ischemic process is not excluded, but no pneumatosis is demonstrated that would provide evidence of ischemia. A cluster of mildly dilated small bowel loops in the right mid-abdomen is  again demonstrated without change since the prior study, possibly indicating an internal hernia. No proximal obstruction. REPRODUCTIVE: Mild prostate enlargement. PERITONEUM AND RETROPERITONEUM: No ascites or free air. Retroperitoneal lymph nodes are moderately prominent, measuring up to 10 mm short axis dimension. Nonspecific but likely reactive. LUNG BASE: Mild scarring or linear atelectasis in the lung bases. Calcified pleural plaques on the right. LYMPH NODES: Retroperitoneal lymph nodes are moderately prominent, measuring up to 10 mm short axis dimension. Nonspecific but likely reactive. BONES AND SOFT TISSUES: In the femoral head suggesting avascular necrosis. Increased bone mineral density may indicate renal osteodystrophy. No acute soft tissue abnormality. IMPRESSION: 1. No occlusion or hemodynamically significant stenosis of the arterial system of the abdomen or pelvis, with patent mesenteric vessels and no aortic dissection or abdominal aortic aneurysm. 2. Small bowel wall thickening suggesting enteritis, without pneumatosis. 3. Cluster of mildly dilated small bowel loops in the right mid-abdomen, possibly indicating an internal hernia, without proximal obstruction and unchanged since the prior study. Electronically signed by: Elsie Gravely MD 01/12/2024 08:27 PM EST RP Workstation: [id number]   Medications:  promethazine  (PHENERGAN ) injection (IM or IVPB)      Chlorhexidine  Gluconate Cloth  6 each Topical Q0600   heparin  injection (subcutaneous)  5,000 Units Subcutaneous Q8H   pantoprazole  (PROTONIX ) IV  40 mg Intravenous Q12H   sucralfate   1 g Oral TID WC & HS    Dialysis Orders MWF - [place] 4:15hr, 400/800, EDW 68.6kg, 1K/2.5Ca bath, TDC, no heparin  - Last K 3.6 on 12/30, but previously high. Last HD 12/30 - left 63.8kg - Micera 200mcg IV q 2 weeks (last 12/28) - venofer 50mg  weekly (last 12/30) - Hectoral 5mcg IV q HD   Assessment/Plan: Abd pain: Hx SBO in past. CTA negative for  mesenteric ischemia, ongoing nausea and pain. On short course sucralfate . Hypokalemia: Prev high K on home Lokelma  and 1K bath with HD - d/c Lokelma , s/p K here and used higher K bath with HD - repeat pending. Will need to change outpatient bath as well. ESRD: Continue HD on MWF schedule - for HD later today. HTN/volume: BP good, below prior EDW. Anemia of ESRD: Hgb 9 - not due for ESA yet Secondary HPTH: Ca/Phos ok - hold sevelamer , likely needs alt binder given Hx SBO.  Nutrition: Alb decent, start prot supps once eating. SLE   Izetta Boehringer, PA-C 01/14/2024, 12:05 PM  [facility]    "

## 2024-01-14 NOTE — Progress Notes (Signed)
 Pt receives out-pt HD at [facility] on MWF 10:15 am chair time. Will assist as needed.   Randine Mungo Dialysis Navigator [phone]

## 2024-01-14 NOTE — Progress Notes (Addendum)
 "    Daily Progress Note  DOA: 01/09/2024 Hospital Day: 6  Cc: Severe postprandial abdominal pain  ASSESSMENT  & PLAN   49 year old male with severe postprandial abdominal pain,  nausea, vomiting,  significant weight loss and imaging concerning for SBO secondary to possible internal hernia Regarding postprandial abdominal pain, CT angio >>>No occlusion / significant stenosis of the mesenteric vessels  Surgery is following.  Contrast in the ascending colon on SBO protocol x-ray . Patient is passing flatus.  However patient continues to have postprandial abdominal pain.  Surgery will take to OR tomorrow for diagnostic lap  ESRD on HD MWF  Hypertension  Chronic pancytopenia Stable  Principal Problem:   Intractable nausea and vomiting Active Problems:   End-stage renal disease on hemodialysis (HCC)   Essential hypertension   Hypokalemia   Generalized abdominal pain   Pancytopenia (HCC)   History of anemia due to chronic kidney disease   Prolonged QT interval   Upper abdominal pain   Abnormal finding on GI tract imaging   Abnormal loss of weight   Subjective No vomiting today but continues to have nausea and the same abdominal pain.  Passing flatus, has not had a bowel movement today.    Objective      Recent Labs    01/12/24 0623  WBC 3.6*  HGB 9.0*  HCT 27.2*  MCV 74.5*  PLT 121*   No results for input(s): FOLATE, VITAMINB12, FERRITIN, TIBC, IRONPCTSAT in the last 72 hours. Recent Labs    01/12/24 0623 01/13/24 1045 01/14/24 0303  NA 137 136 134*  K 2.9* 3.6 3.8  CL 96* 95* 94*  CO2 25 25 26   GLUCOSE 82 82 76  BUN 43* 37* 43*  CREATININE 12.60* 10.60* 12.10*  CALCIUM 8.5* 8.8* 8.9   Recent Labs    01/12/24 0623 01/13/24 1045 01/14/24 0303  ALBUMIN 3.5 3.8 3.6      Imaging:  CT Angio Abd/Pel w/ and/or w/o EXAM: CTA ABDOMEN AND PELVIS WITH AND WITHOUT CONTRAST 01/12/2024 04:46:20 PM  TECHNIQUE: CTA images of the abdomen and  pelvis without and with intravenous contrast. Three-dimensional MIP/volume rendered formations were performed. Automated exposure control, iterative reconstruction, and/or weight based adjustment of the mA/kV was utilized to reduce the radiation dose to as low as reasonably achievable.  CONTRAST: 75 mL of Omnipaque  350.  COMPARISON: CT Abdomen Pelvis 01/09/2024.  CLINICAL HISTORY: Mesenteric ischemia, chronic.  FINDINGS:  VASCULATURE: Multiple collateral vessels demonstrated in the right upper quadrant and right lateral chest wall and flank region. Right femoral central venous catheter is present with tip in the inferior vena cava. There appears to be a vascular graft in the left femoral region. No intraluminal contrast extravasation is identified. Mesenteric vessels appear patent.  AORTA: Prominent calcification of the abdominal aorta without aneurysm or dissection.  CELIAC TRUNK: No acute finding. No occlusion or significant stenosis.  SUPERIOR MESENTERIC ARTERY: No acute finding. No occlusion or significant stenosis.  RENAL ARTERIES: No acute finding. No occlusion or significant stenosis.  ILIAC ARTERIES: No acute finding. No occlusion or significant stenosis.  LIVER: The liver is unremarkable.  GALLBLADDER AND BILE DUCTS: Gallbladder is unremarkable. No biliary ductal dilatation.  SPLEEN: The spleen is unremarkable.  PANCREAS: The pancreas is unremarkable.  ADRENAL GLANDS: Bilateral adrenal glands are unremarkable.  KIDNEYS, URETERS AND BLADDER: Bilateral renal parenchymal atrophy with multiple parenchymal cysts bilaterally. Some of the cysts have thin calcified walls. Appearances are similar to the prior study. No imaging follow-up is  indicated. No stones in the kidneys or ureters. No hydronephrosis. No perinephric or periureteral stranding. Urinary bladder is unremarkable.  GI AND BOWEL: The stomach, small bowel, and colon are not abnormally  distended. There is evidence of small bowel wall thickening suggesting enteritis. This could be infectious or inflammatory. Ischemic process is not excluded, but no pneumatosis is demonstrated that would provide evidence of ischemia. A cluster of mildly dilated small bowel loops in the right mid-abdomen is again demonstrated without change since the prior study, possibly indicating an internal hernia. No proximal obstruction.  REPRODUCTIVE: Mild prostate enlargement.  PERITONEUM AND RETROPERITONEUM: No ascites or free air. Retroperitoneal lymph nodes are moderately prominent, measuring up to 10 mm short axis dimension. Nonspecific but likely reactive.  LUNG BASE: Mild scarring or linear atelectasis in the lung bases. Calcified pleural plaques on the right.  LYMPH NODES: Retroperitoneal lymph nodes are moderately prominent, measuring up to 10 mm short axis dimension. Nonspecific but likely reactive.  BONES AND SOFT TISSUES: In the femoral head suggesting avascular necrosis. Increased bone mineral density may indicate renal osteodystrophy. No acute soft tissue abnormality.  IMPRESSION: 1. No occlusion or hemodynamically significant stenosis of the arterial system of the abdomen or pelvis, with patent mesenteric vessels and no aortic dissection or abdominal aortic aneurysm. 2. Small bowel wall thickening suggesting enteritis, without pneumatosis. 3. Cluster of mildly dilated small bowel loops in the right mid-abdomen, possibly indicating an internal hernia, without proximal obstruction and unchanged since the prior study.  Electronically signed by: Elsie Gravely MD 01/12/2024 08:27 PM EST RP Workstation: [id number]     Scheduled inpatient medications:   Chlorhexidine  Gluconate Cloth  6 each Topical Q0600   heparin  injection (subcutaneous)  5,000 Units Subcutaneous Q8H   pantoprazole  (PROTONIX ) IV  40 mg Intravenous Q12H   sucralfate   1 g Oral TID WC & HS   Continuous  inpatient infusions:   promethazine  (PHENERGAN ) injection (IM or IVPB)     PRN inpatient medications: acetaminophen  **OR** acetaminophen , hydrALAZINE , HYDROmorphone  (DILAUDID ) injection, LORazepam , LORazepam , naLOXone  (NARCAN )  injection, ondansetron  (ZOFRAN ) IV, promethazine  (PHENERGAN ) injection (IM or IVPB)  Vital signs in last 24 hours: Temp:  [98.3 F (36.8 C)-98.4 F (36.9 C)] 98.4 F (36.9 C) (01/05 0749) Pulse Rate:  [78] 78 (01/05 0414) Resp:  [18] 18 (01/05 0414) BP: (131-171)/(88-102) 156/99 (01/05 0749)    Intake/Output Summary (Last 24 hours) at 01/14/2024 1553 Last data filed at 01/14/2024 0400 Gross per 24 hour  Intake --  Output 0 ml  Net 0 ml    Intake/Output from previous day: No intake/output data recorded. Intake/Output this shift: No intake/output data recorded.   Physical Exam:  General: Alert male in NAD Heart:  Regular rate and rhythm.  Pulmonary: Normal respiratory effort Abdomen: Soft, nondistended, nontender. Normal bowel sounds. Extremities: No lower extremity edema  Neurologic: Alert and oriented Psych: Pleasant. Cooperative     LOS: 3 days   Vina Dasen ,NP 01/14/2024, 3:53 PM  ----------------------------------------------------------------------------  I have taken a history, reviewed the chart and examined the patient. I performed a substantive portion of this encounter, including complete performance of at least one of the key components, in conjunction with the APP. I agree with the APP's note, impression and recommendations  Patient's symptoms unchanged.  Surgery has agreed to take patient for ex lap.  Hopefully an abnormality such as an internal hernia will be identified and amenable to treatment.  Await surgical findings.  Charmane Protzman E. Stacia, MD [facility]  Low complex medical  decision making (this includes chart review, review of results, face-to-face time used for counseling as well as treatment plan and  follow-up.       "

## 2024-01-14 NOTE — TOC Initial Note (Signed)
 Transition of Care [facility]) - Initial/Assessment Note    Patient Details  Name: Nathan Castaneda MRN: [id number] Date of Birth: 1975/09/14  [facility] CM/SW Contact:    Landry LABOR Senters, RN Phone Number: 01/14/2024, 9:37 AM  Clinical Narrative:                 RR:[id number] history significant for end-stage renal disease on hemodialysis on Monday, Wednesday, Friday schedule, anemia of chronic disease with baseline hemoglobin 8-11, who is admitted to [facility] on 01/09/2024 with intractable nausea/vomiting after presenting from home to [facility] ED complaining of nausea vomiting.   Patient reports he lives with a roommate, they do not provide a lot of support to one another. Patient reports having no other support systems in place. Patient is on HD-schedule MWF at [facility]. Uses [facility] for transport to HD and other appts. He does not drive. Patient has been ambulating independently in room.   Patient has PCP, reports managing own medications, no DME at home. Patient reports needing a cab home at d/c and cannot pay for this. TOC or d/c lounge to assist with transportation home at d/c.   CM will continue to follow.  Expected Discharge Plan: Home/Self Care Barriers to Discharge: Continued Medical Work up   Patient Goals and CMS Choice            Expected Discharge Plan and Services       Living arrangements for the past 2 months: Single Family Home                                      Prior Living Arrangements/Services Living arrangements for the past 2 months: Single Family Home Lives with:: Self, Roommate Patient language and need for interpreter reviewed:: Yes Do you feel safe going back to the place where you live?: Yes      Need for Family Participation in Patient Care: No (Comment) Care giver support system in place?: No (comment)   Criminal Activity/Legal Involvement Pertinent to Current Situation/Hospitalization: No - Comment as needed  Activities  of Daily Living   ADL Screening (condition at time of admission) Independently performs ADLs?: Yes (appropriate for developmental age) Is the patient deaf or have difficulty hearing?: No Does the patient have difficulty seeing, even when wearing glasses/contacts?: No Does the patient have difficulty concentrating, remembering, or making decisions?: No  Permission Sought/Granted                  Emotional Assessment Appearance:: Developmentally appropriate Attitude/Demeanor/Rapport: Engaged Affect (typically observed): Calm Orientation: : Oriented to Self, Oriented to Place, Oriented to  Time, Oriented to Situation Alcohol / Substance Use: Not Applicable Psych Involvement: No (comment)  Admission diagnosis:  Small bowel obstruction (HCC) [K56.609] Intractable nausea and vomiting [R11.2] Abdominal pain, unspecified abdominal location [R10.9] Patient Active Problem List   Diagnosis Date Noted   Upper abdominal pain 01/13/2024   Abnormal finding on GI tract imaging 01/13/2024   Abnormal loss of weight 01/13/2024   Generalized abdominal pain 01/11/2024   Pancytopenia (HCC) 01/11/2024   History of anemia due to chronic kidney disease 01/11/2024   Prolonged QT interval 01/11/2024   Intractable nausea and vomiting 01/10/2024   Problem with dialysis access 08/28/2023   Complication of vascular dialysis catheter 08/27/2023   Infection of vascular bypass graft, 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   Hypokalemia 02/21/2022   Coag negative Staphylococcus  bacteremia 02/21/2022   Bacteremia 02/21/2022   SBO (small bowel obstruction) (HCC) 03/31/2021   Elevated lipase 03/31/2021   GERD without esophagitis 03/31/2021   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    Class: Chronic   Bilateral hydronephrosis 03/18/2020   Left renal mass 03/18/2020   Lower urinary tract symptoms (LUTS) 03/18/2020    Crohn's disease of colon without complication (HCC) 01/07/2020   Peritoneal dialysis catheter in place 01/07/2020   Pre-transplant evaluation for kidney transplant 01/07/2020   SLE (systemic lupus erythematosus) (HCC) 01/07/2020   Tobacco abuse 01/07/2020   Essential hypertension    Anemia of chronic disease    Hyponatremia    False aneurysm of artery    Acute on chronic anemia 09/02/2019   End-stage renal disease on hemodialysis (HCC) 09/02/2019   Hypertensive urgency 09/02/2019   Nausea & vomiting 09/02/2019   Epistaxis 09/02/2019   Headache 04/15/2018   CHF (congestive heart failure) (HCC) 01/18/2018   PCP:  Norine Manuelita LABOR, MD Pharmacy:   [facility] [address] [place] - [address] [place] [address] Phone: [phone] Fax: [phone]  [facility] [address] [place] [address] Phone: [phone] Fax: [phone]     Social Drivers of Health (SDOH) Social History: SDOH Screenings   Food Insecurity: No Food Insecurity (01/11/2024)  Housing: Low Risk (01/11/2024)  Transportation Needs: No Transportation Needs (01/11/2024)  Utilities: Not At Risk (01/11/2024)  Social Connections: Unknown (01/11/2024)  Tobacco Use: High Risk (01/11/2024)   SDOH Interventions:     Readmission Risk Interventions    08/28/2023    3:31 PM  Readmission Risk Prevention Plan  Transportation Screening Complete  PCP or Specialist Appt within 5-7 Days Complete  Home Care Screening Complete  Medication Review (RN CM) Referral to Pharmacy

## 2024-01-15 ENCOUNTER — Inpatient Hospital Stay (HOSPITAL_COMMUNITY): Admitting: Registered Nurse

## 2024-01-15 ENCOUNTER — Encounter (HOSPITAL_COMMUNITY): Admission: EM | Disposition: A | Payer: Self-pay | Source: Home / Self Care | Attending: Internal Medicine

## 2024-01-15 ENCOUNTER — Encounter (HOSPITAL_COMMUNITY): Payer: Self-pay | Admitting: Internal Medicine

## 2024-01-15 DIAGNOSIS — I509 Heart failure, unspecified: Secondary | ICD-10-CM

## 2024-01-15 DIAGNOSIS — K469 Unspecified abdominal hernia without obstruction or gangrene: Secondary | ICD-10-CM

## 2024-01-15 DIAGNOSIS — N186 End stage renal disease: Secondary | ICD-10-CM

## 2024-01-15 DIAGNOSIS — R1084 Generalized abdominal pain: Secondary | ICD-10-CM | POA: Diagnosis not present

## 2024-01-15 DIAGNOSIS — I130 Hypertensive heart and chronic kidney disease with heart failure and stage 1 through stage 4 chronic kidney disease, or unspecified chronic kidney disease: Secondary | ICD-10-CM

## 2024-01-15 DIAGNOSIS — F17210 Nicotine dependence, cigarettes, uncomplicated: Secondary | ICD-10-CM

## 2024-01-15 DIAGNOSIS — R933 Abnormal findings on diagnostic imaging of other parts of digestive tract: Secondary | ICD-10-CM | POA: Diagnosis not present

## 2024-01-15 DIAGNOSIS — Z992 Dependence on renal dialysis: Secondary | ICD-10-CM | POA: Diagnosis not present

## 2024-01-15 HISTORY — PX: LAPAROTOMY: SHX154

## 2024-01-15 LAB — CBC
HCT: 28.1 % — ABNORMAL LOW (ref 39.0–52.0)
Hemoglobin: 9.2 g/dL — ABNORMAL LOW (ref 13.0–17.0)
MCH: 24.6 pg — ABNORMAL LOW (ref 26.0–34.0)
MCHC: 32.7 g/dL (ref 30.0–36.0)
MCV: 75.1 fL — ABNORMAL LOW (ref 80.0–100.0)
Platelets: 129 K/uL — ABNORMAL LOW (ref 150–400)
RBC: 3.74 MIL/uL — ABNORMAL LOW (ref 4.22–5.81)
RDW: 16.4 % — ABNORMAL HIGH (ref 11.5–15.5)
WBC: 4.3 K/uL (ref 4.0–10.5)
nRBC: 0 % (ref 0.0–0.2)

## 2024-01-15 LAB — SURGICAL PCR SCREEN
MRSA, PCR: NEGATIVE
Staphylococcus aureus: NEGATIVE

## 2024-01-15 LAB — RENAL FUNCTION PANEL
Albumin: 3.5 g/dL (ref 3.5–5.0)
Anion gap: 17 — ABNORMAL HIGH (ref 5–15)
BUN: 56 mg/dL — ABNORMAL HIGH (ref 6–20)
CO2: 24 mmol/L (ref 22–32)
Calcium: 8.6 mg/dL — ABNORMAL LOW (ref 8.9–10.3)
Chloride: 93 mmol/L — ABNORMAL LOW (ref 98–111)
Creatinine, Ser: 14.2 mg/dL — ABNORMAL HIGH (ref 0.61–1.24)
GFR, Estimated: 4 mL/min — ABNORMAL LOW
Glucose, Bld: 84 mg/dL (ref 70–99)
Phosphorus: 5.9 mg/dL — ABNORMAL HIGH (ref 2.5–4.6)
Potassium: 4.1 mmol/L (ref 3.5–5.1)
Sodium: 134 mmol/L — ABNORMAL LOW (ref 135–145)

## 2024-01-15 LAB — HEPATITIS B SURFACE ANTIBODY, QUANTITATIVE: Hep B S AB Quant (Post): 85.2 m[IU]/mL

## 2024-01-15 SURGERY — LAPAROTOMY, EXPLORATORY
Anesthesia: General | Site: Abdomen

## 2024-01-15 MED ORDER — 0.9 % SODIUM CHLORIDE (POUR BTL) OPTIME
TOPICAL | Status: DC | PRN
  Administered 2024-01-15: 1000 mL

## 2024-01-15 MED ORDER — PROPOFOL 10 MG/ML IV BOLUS
INTRAVENOUS | Status: AC
  Filled 2024-01-15: qty 20

## 2024-01-15 MED ORDER — ONDANSETRON HCL 4 MG/2ML IJ SOLN
INTRAMUSCULAR | Status: DC | PRN
  Administered 2024-01-15: 4 mg via INTRAVENOUS

## 2024-01-15 MED ORDER — KETAMINE HCL 50 MG/5ML IJ SOSY
PREFILLED_SYRINGE | INTRAMUSCULAR | Status: AC
  Filled 2024-01-15: qty 5

## 2024-01-15 MED ORDER — HEPARIN SODIUM (PORCINE) 1000 UNIT/ML IJ SOLN
4600.0000 [IU] | Freq: Once | INTRAMUSCULAR | Status: AC
  Administered 2024-01-15: 4600 [IU]
  Filled 2024-01-15: qty 4.6

## 2024-01-15 MED ORDER — CEFAZOLIN SODIUM-DEXTROSE 1-4 GM/50ML-% IV SOLN
INTRAVENOUS | Status: DC | PRN
  Administered 2024-01-15: 1 g via INTRAVENOUS

## 2024-01-15 MED ORDER — PROPOFOL 10 MG/ML IV BOLUS
INTRAVENOUS | Status: DC | PRN
  Administered 2024-01-15: 200 mg via INTRAVENOUS

## 2024-01-15 MED ORDER — BUPIVACAINE HCL (PF) 0.25 % IJ SOLN
INTRAMUSCULAR | Status: DC | PRN
  Administered 2024-01-15: 3 mL

## 2024-01-15 MED ORDER — ORAL CARE MOUTH RINSE: 15.0000 mL | Freq: Once | OROMUCOSAL | Status: AC

## 2024-01-15 MED ORDER — PHENYLEPHRINE HCL-NACL 20-0.9 MG/250ML-% IV SOLN
INTRAVENOUS | Status: DC | PRN
  Administered 2024-01-15: 50 ug/min via INTRAVENOUS

## 2024-01-15 MED ORDER — DEXAMETHASONE SOD PHOSPHATE PF 10 MG/ML IJ SOLN
INTRAMUSCULAR | Status: DC | PRN
  Administered 2024-01-15: 5 mg via INTRAVENOUS

## 2024-01-15 MED ORDER — SUGAMMADEX SODIUM 200 MG/2ML IV SOLN
INTRAVENOUS | Status: DC | PRN
  Administered 2024-01-15: 400 mg via INTRAVENOUS

## 2024-01-15 MED ORDER — ROCURONIUM BROMIDE 10 MG/ML (PF) SYRINGE
PREFILLED_SYRINGE | INTRAVENOUS | Status: DC | PRN
  Administered 2024-01-15: 50 mg via INTRAVENOUS

## 2024-01-15 MED ORDER — DEXMEDETOMIDINE HCL IN NACL 80 MCG/20ML IV SOLN
INTRAVENOUS | Status: DC | PRN
  Administered 2024-01-15 (×2): 8 ug via INTRAVENOUS

## 2024-01-15 MED ORDER — BUPIVACAINE HCL (PF) 0.25 % IJ SOLN
INTRAMUSCULAR | Status: AC
  Filled 2024-01-15: qty 30

## 2024-01-15 MED ORDER — FENTANYL CITRATE (PF) 250 MCG/5ML IJ SOLN
INTRAMUSCULAR | Status: DC | PRN
  Administered 2024-01-15: 100 ug via INTRAVENOUS

## 2024-01-15 MED ORDER — LIDOCAINE 2% (20 MG/ML) 5 ML SYRINGE
INTRAMUSCULAR | Status: DC | PRN
  Administered 2024-01-15: 60 mg via INTRAVENOUS

## 2024-01-15 MED ORDER — CEFAZOLIN SODIUM-DEXTROSE 1-4 GM/50ML-% IV SOLN
INTRAVENOUS | Status: AC
  Filled 2024-01-15: qty 50

## 2024-01-15 MED ORDER — FENTANYL CITRATE (PF) 100 MCG/2ML IJ SOLN
INTRAMUSCULAR | Status: AC
  Filled 2024-01-15: qty 2

## 2024-01-15 MED ORDER — PHENYLEPHRINE 80 MCG/ML (10ML) SYRINGE FOR IV PUSH (FOR BLOOD PRESSURE SUPPORT)
PREFILLED_SYRINGE | INTRAVENOUS | Status: DC | PRN
  Administered 2024-01-15: 160 ug via INTRAVENOUS

## 2024-01-15 MED ORDER — ACETAMINOPHEN 10 MG/ML IV SOLN
INTRAVENOUS | Status: AC
  Filled 2024-01-15: qty 100

## 2024-01-15 MED ORDER — HEPARIN SODIUM (PORCINE) 1000 UNIT/ML IJ SOLN
INTRAMUSCULAR | Status: AC
  Filled 2024-01-15: qty 5

## 2024-01-15 MED ORDER — HYDROMORPHONE HCL 1 MG/ML IJ SOLN
INTRAMUSCULAR | Status: AC
  Filled 2024-01-15: qty 0.5

## 2024-01-15 MED ORDER — CHLORHEXIDINE GLUCONATE 0.12 % MT SOLN
15.0000 mL | Freq: Once | OROMUCOSAL | Status: AC
  Administered 2024-01-15: 15 mL via OROMUCOSAL

## 2024-01-15 MED ORDER — SODIUM CHLORIDE 0.9 % IV SOLN: INTRAVENOUS | Status: DC

## 2024-01-15 MED ORDER — ACETAMINOPHEN 10 MG/ML IV SOLN
INTRAVENOUS | Status: DC | PRN
  Administered 2024-01-15: 1000 mg via INTRAVENOUS

## 2024-01-15 SURGICAL SUPPLY — 35 items
BLADE CLIPPER SURG (BLADE) IMPLANT
CANISTER SUCTION 3000ML PPV (SUCTIONS) ×1 IMPLANT
CHLORAPREP W/TINT 26 (MISCELLANEOUS) ×1 IMPLANT
COVER SURGICAL LIGHT HANDLE (MISCELLANEOUS) ×1 IMPLANT
DERMABOND ADVANCED .7 DNX12 (GAUZE/BANDAGES/DRESSINGS) IMPLANT
DRAPE LAPAROSCOPIC ABDOMINAL (DRAPES) ×1 IMPLANT
DRAPE WARM FLUID 44X44 (DRAPES) ×1 IMPLANT
DRSG OPSITE POSTOP 4X10 (GAUZE/BANDAGES/DRESSINGS) IMPLANT
DRSG OPSITE POSTOP 4X8 (GAUZE/BANDAGES/DRESSINGS) IMPLANT
ELECT BLADE 6.5 EXT (BLADE) IMPLANT
ELECT CAUTERY BLADE 6.4 (BLADE) ×1 IMPLANT
ELECTRODE REM PT RTRN 9FT ADLT (ELECTROSURGICAL) ×1 IMPLANT
GLOVE BIO SURGEON STRL SZ7.5 (GLOVE) ×2 IMPLANT
GLOVE BIOGEL PI IND STRL 8 (GLOVE) ×1 IMPLANT
GOWN STRL REUS W/ TWL LRG LVL3 (GOWN DISPOSABLE) ×1 IMPLANT
GOWN STRL REUS W/ TWL XL LVL3 (GOWN DISPOSABLE) ×1 IMPLANT
HANDLE SUCTION POOLE (INSTRUMENTS) ×1 IMPLANT
KIT BASIN OR (CUSTOM PROCEDURE TRAY) ×1 IMPLANT
KIT TURNOVER KIT B (KITS) ×1 IMPLANT
LIGASURE IMPACT 36 18CM CVD LR (INSTRUMENTS) IMPLANT
PACK GENERAL/GYN (CUSTOM PROCEDURE TRAY) ×1 IMPLANT
PAD ARMBOARD POSITIONER FOAM (MISCELLANEOUS) ×1 IMPLANT
PENCIL SMOKE EVACUATOR (MISCELLANEOUS) ×1 IMPLANT
SOLN 0.9% NACL POUR BTL 1000ML (IV SOLUTION) ×2 IMPLANT
SPONGE T-LAP 18X18 ~~LOC~~+RFID (SPONGE) IMPLANT
STAPLER SKIN PROX 35W (STAPLE) ×1 IMPLANT
SUT MNCRL AB 4-0 PS2 18 (SUTURE) IMPLANT
SUT PDS AB 1 TP1 54 (SUTURE) ×2 IMPLANT
SUT SILK 2 0 SH CR/8 (SUTURE) ×1 IMPLANT
SUT SILK 2 0 TIES 10X30 (SUTURE) ×1 IMPLANT
SUT SILK 3 0 SH CR/8 (SUTURE) ×1 IMPLANT
SUT SILK 3 0 TIES 10X30 (SUTURE) ×1 IMPLANT
TOWEL GREEN STERILE (TOWEL DISPOSABLE) ×1 IMPLANT
TRAY FOLEY MTR SLVR 16FR STAT (SET/KITS/TRAYS/PACK) ×1 IMPLANT
YANKAUER SUCT BULB TIP NO VENT (SUCTIONS) IMPLANT

## 2024-01-15 NOTE — Progress Notes (Signed)
 Received patient in bed to unit.  Alert and oriented.  Informed consent signed and in chart.   TX duration: 3 hours and 12 minutes.  Patient felt sick and signed AMA paper work to come off early.  Patient tolerated well.  Transported back to the room  Alert, without acute distress.  Hand-off given to patient's nurse.   Access used: R HD chest cath Access issues: none  Total UF removed: Medication(s) given: dilaudid    01/15/24 1745  Vitals  Temp 98.5 F (36.9 C)  Temp Source Oral  BP (!) 132/91  MAP (mmHg) 104  Pulse Rate 99  ECG Heart Rate 83  Resp 10  Weight 60.7 kg  Type of Weight Post-Dialysis  Oxygen Therapy  SpO2 98 %  O2 Device Room Air  During Treatment Monitoring  Duration of HD Treatment -hour(s) 3.2 hour(s) (3 hours 12 minutes)  HD Safety Checks Performed Yes  Intra-Hemodialysis Comments See progress note (Patient signed AMA paper work to come off 18 minutes early)  Restaurant Manager, [employer] Clearance Heavily streaked  Liters Processed 76.8  Fluid Removed (mL) 400 mL  Tolerated HD Treatment No (Comment)  Post-Hemodialysis Comments Patient signed AMA paper work to come off 18 minutes early  Hemodialysis Catheter Right Femoral vein Double lumen Permanent (Tunneled)  Placement Date/Time: 12/27/23 0909   Placed prior to admission: No  Serial / Lot #: [id number]  Expiration Date: 03/08/28  Time Out: Correct patient;Correct site;Correct procedure  Maximum sterile barrier precautions: Hand hygiene;Large sterile sheet;C...  Site Condition No complications  Blue Lumen Status Flushed;Heparin  locked;Antimicrobial dead end cap  Red Lumen Status Flushed;Heparin  locked;Antimicrobial dead end cap  Purple Lumen Status N/A  Catheter fill solution Heparin  1000 units/ml  Catheter fill volume (Arterial) 2.3 cc  Catheter fill volume (Venous) 2.3  Dressing Type Transparent  Dressing Status Antimicrobial disc/dressing in place;Clean, Dry, Intact  Drainage Description  None  Dressing Change Due 01/19/24  Post treatment catheter status Capped and Clamped     Camellia Brasil LPN Kidney Dialysis Unit

## 2024-01-15 NOTE — Care Management Important Message (Signed)
 Important Message  Patient Details  Name: Nathan Castaneda MRN: [id number] Date of Birth: 1975-08-12   Important Message Given:        Claretta Deed 01/15/2024, 10:24 AM

## 2024-01-15 NOTE — Interval H&P Note (Signed)
 History and Physical Interval Note:  01/15/2024 8:00 AM  Nathan Castaneda  has presented today for surgery, with the diagnosis of Internal Hernia.  The various methods of treatment have been discussed with the patient and family. After consideration of risks, benefits and other options for treatment, the patient has consented to  a diagnostic laparoscopy as a surgical intervention.  The patient's history has been reviewed, patient examined, no change in status, stable for surgery.  I have reviewed the patient's chart and labs.  Questions were answered to the patient's satisfaction.     Jaikob Borgwardt

## 2024-01-15 NOTE — Progress Notes (Addendum)
 " [facility] Progress Note   Subjective:   Seen in room - for HD today. 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 No CP/dyspnea  Objective Vitals:   01/15/24 0918 01/15/24 0930 01/15/24 0945 01/15/24 1006  BP: (!) 133/94 (!) 130/90 (!) 128/91 (!) 124/91  Pulse: 86 91 96 92  Resp: 14 11 11 10   Temp: 98.1 F (36.7 C)  98.1 F (36.7 C) 98 F (36.7 C)  TempSrc:    Oral  SpO2: 97% 96% 95% 96%  Weight:      Height:       Physical Exam General: Thin, but well appearing man, NAD. Room air Heart: RRR Lungs: CTAB Extremities: no LE edema Dialysis Access:  R femoral [facility]  Additional Objective Labs: Basic Metabolic Panel: Recent Labs  Lab 01/13/24 1045 01/14/24 0303 01/15/24 1123  NA 136 134* 134*  K 3.6 3.8 4.1  CL 95* 94* 93*  CO2 25 26 24   GLUCOSE 82 76 84  BUN 37* 43* 56*  CREATININE 10.60* 12.10* 14.20*  CALCIUM 8.8* 8.9 8.6*  PHOS 4.7* 5.6* 5.9*   Liver Function Tests: Recent Labs  Lab 01/09/24 2239 01/11/24 0503 01/12/24 0623 01/13/24 1045 01/14/24 0303 01/15/24 1123  AST 16 15  --   --   --   --   ALT 6 7  --   --   --   --   ALKPHOS 79 74  --   --   --   --   BILITOT 0.8 0.7  --   --   --   --   PROT 8.3* 7.3  --   --   --   --   ALBUMIN 4.1 3.8   < > 3.8 3.6 3.5   < > = values in this interval not displayed.   Recent Labs  Lab 01/09/24 2239  LIPASE 153*   CBC: Recent Labs  Lab 01/09/24 2239 01/11/24 0503 01/12/24 0623 01/15/24 1123  WBC 3.3* 3.0* 3.6* 4.3  NEUTROABS 1.7 1.6*  --   --   HGB 10.7* 9.5* 9.0* 9.2*  HCT 33.4* 29.2* 27.2* 28.1*  MCV 77.1* 76.0* 74.5* 75.1*  PLT 149* 121* 121* 129*   Medications:  promethazine  (PHENERGAN ) injection (IM or IVPB)      Chlorhexidine  Gluconate Cloth  6 each Topical Q0600   heparin  injection (subcutaneous)  5,000 Units Subcutaneous Q8H   pantoprazole  (PROTONIX ) IV  40 mg Intravenous Q12H   sucralfate   1 g Oral TID WC & HS    Dialysis Orders MWF -  [place] 4:15hr, 400/800, EDW 68.6kg, 1K/2.5Ca bath, TDC, no heparin  - Last K 3.6 on 12/30, but previously high. Last HD 12/30 - left 63.8kg - Micera 200mcg IV q 2 weeks (last 12/28) - venofer 50mg  weekly (last 12/30) - Hectoral 5mcg IV q HD   Assessment/Plan: Abd pain: Hx SBO in past. CTA negative for mesenteric ischemia, ongoing nausea and pain. S/p laparoscopy this AM showing frozen abdomen. Hypokalemia: Prev high K on home Lokelma  and 1K bath with HD - d/c Lokelma , s/p K here and used higher K bath with HD - repeat pending. Will need to change outpatient bath as well. ESRD: Usual MWF schedule - for HD today, as rollover. Min UF, not eating much. HTN/volume: BP good, below prior EDW. Anemia of ESRD: Hgb 9.2 - not due for ESA yet Secondary HPTH: Ca/Phos ok - holding sevelamer , needs alt binder given Hx SBO. He doesn't want  Auryxia , willing to try a chewable - wait until eating better..  Nutrition: Alb decent for now. SLE   Izetta Boehringer, PA-C 01/15/2024, 1:17 PM  [employer]    "

## 2024-01-15 NOTE — Care Management Important Message (Signed)
 Important Message  Patient Details  Name: Nathan Castaneda MRN: [id number] Date of Birth: 10/15/1975   Important Message Given:  Yes - Medicare IM     Claretta Deed 01/15/2024, 10:24 AM

## 2024-01-15 NOTE — Transfer of Care (Signed)
 Immediate Anesthesia Transfer of Care Note  Patient: Nathan Castaneda  Procedure(s) Performed: DIAGNOSTIC LAPAROSCOPY (Abdomen)  Patient Location: PACU  Anesthesia Type:General  Level of Consciousness: drowsy and responds to stimulation  Airway & Oxygen Therapy: Patient Spontanous Breathing  Post-op Assessment: Report given to RN and Post -op Vital signs reviewed and stable  Post vital signs: Reviewed and stable  Last Vitals:  Vitals Value Taken Time  BP 133/94 01/15/24 09:18  Temp 36.7 C 01/15/24 09:18  Pulse 86 01/15/24 09:21  Resp 9 01/15/24 09:21  SpO2 97 % 01/15/24 09:21  Vitals shown include unfiled device data.  Last Pain:  Vitals:   01/15/24 0806  TempSrc: Oral  PainSc:          Complications: No notable events documented.

## 2024-01-15 NOTE — Anesthesia Postprocedure Evaluation (Signed)
"   Anesthesia Post Note  Patient: Kadar A Jarboe  Procedure(s) Performed: DIAGNOSTIC LAPAROSCOPY (Abdomen)     Patient location during evaluation: PACU Anesthesia Type: General Level of consciousness: awake and alert Pain management: pain level controlled Vital Signs Assessment: post-procedure vital signs reviewed and stable Respiratory status: spontaneous breathing, nonlabored ventilation, respiratory function stable and patient connected to nasal cannula oxygen Cardiovascular status: blood pressure returned to baseline and stable Postop Assessment: no apparent nausea or vomiting Anesthetic complications: no   No notable events documented.  Last Vitals:  Vitals:   01/15/24 0945 01/15/24 1006  BP: (!) 128/91 (!) 124/91  Pulse: 96 92  Resp: 11 10  Temp: 36.7 C 36.7 C  SpO2: 95% 96%    Last Pain:  Vitals:   01/15/24 1006  TempSrc: Oral  PainSc:                  Sevyn Markham S      "

## 2024-01-15 NOTE — Anesthesia Preprocedure Evaluation (Signed)
"                                    Anesthesia Evaluation  Patient identified by MRN, date of birth, ID band Patient awake    Reviewed: Allergy & Precautions, H&P , NPO status , Patient's Chart, lab work & pertinent test results  Airway Mallampati: II   Neck ROM: full    Dental   Pulmonary Current Smoker and Patient abstained from smoking.   breath sounds clear to auscultation       Cardiovascular hypertension,  Rhythm:regular Rate:Normal     Neuro/Psych  Headaches  Anxiety        GI/Hepatic ,GERD  ,,  Endo/Other    Renal/GU ESRF and DialysisRenal disease     Musculoskeletal   Abdominal   Peds  Hematology   Anesthesia Other Findings   Reproductive/Obstetrics                              Anesthesia Physical Anesthesia Plan  ASA: 3  Anesthesia Plan: General   Post-op Pain Management:    Induction: Intravenous  PONV Risk Score and Plan: 1 and Ondansetron , Dexamethasone , Midazolam  and Treatment may vary due to age or medical condition  Airway Management Planned: Oral ETT  Additional Equipment:   Intra-op Plan:   Post-operative Plan: Extubation in OR  Informed Consent: I have reviewed the patients History and Physical, chart, labs and discussed the procedure including the risks, benefits and alternatives for the proposed anesthesia with the patient or authorized representative who has indicated his/her understanding and acceptance.     Dental advisory given  Plan Discussed with: CRNA, Anesthesiologist and Surgeon  Anesthesia Plan Comments:         Anesthesia Quick Evaluation  "

## 2024-01-15 NOTE — Op Note (Signed)
 01/15/2024  9:06 AM  PATIENT:  Nathan Castaneda  49 y.o. 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 Patient came in secondary abdominal pain after eating.  Patient states he was having generalized abdominal pain.  Patient underwent CT scan there was concern for possible internal hernia.  Secondary to continued unrelenting pain patient was counseled and taken back to the OR for diagnostic laparoscopy.  Findings: Patient with a frozen abdomen.  There was no space that could be visualized.  There was firmness to all portions of the intra-abdominal contents.  No internal hernia was seen.  Details of procedure: After the patient was consented he was taken back to the OR and placed supine position with bilateral SCDs in place.  He underwent general endotracheal intubation.  He was then prepped and draped in standard fashion.  A timeout was called all facts verified.  Veress needle technique was used insufflate the abdomen to 15 mmHg in the right subcostal margin.  Subsequent to this a 5 Miller trocar and placed intra-abdominal.  Upon placement of the camera it was easy to see that was below the omentum the stomach was adherent to the anterior abdominal wall.  There appeared to be no injury to any intra-abdominal contents with the trocar.  The most omentum was adherent to the anterior  wall as well.  There is very little room to find space for another trocar.  I was able to find a space just to the right of the falciform ligament.  A 5 Miller trocars placed here.  I was able to try to mobilize fortunes of the small bowel and stomach however things were very adherent.  To read any further harm decided to abandon the case.  The insufflation was evacuated.  Trocars were removed.  Skin was reapproximated using 4-0 Monocryl in subcuticular fashion.  Patient tolerated procedure well was taken to the recovery in stable condition.   PLAN OF CARE: Admit to inpatient   PATIENT DISPOSITION:  PACU - hemodynamically stable.   Delay start of Pharmacological VTE agent (>24hrs) due to surgical blood loss or risk of bleeding: not applicable

## 2024-01-15 NOTE — Anesthesia Procedure Notes (Signed)
 Procedure Name: Intubation Date/Time: 01/15/2024 8:42 AM  Performed by: Virgil Ee, CRNAPre-anesthesia Checklist: Patient identified, Patient being monitored, Timeout performed, Emergency Drugs available and Suction available Patient Re-evaluated:Patient Re-evaluated prior to induction Oxygen Delivery Method: Circle system utilized Preoxygenation: Pre-oxygenation with 100% oxygen Induction Type: IV induction Ventilation: Mask ventilation without difficulty Laryngoscope Size: Mac and 4 Grade View: Grade I Tube type: Oral Tube size: 7.0 mm Number of attempts: 1 Airway Equipment and Method: Stylet Placement Confirmation: ETT inserted through vocal cords under direct vision, positive ETCO2 and breath sounds checked- equal and bilateral Secured at: 23 cm Tube secured with: Tape Dental Injury: Teeth and Oropharynx as per pre-operative assessment

## 2024-01-15 NOTE — Plan of Care (Signed)

## 2024-01-15 NOTE — Progress Notes (Signed)
 "                        PROGRESS NOTE        PATIENT DETAILS Name: Nathan Castaneda Age: 49 y.o. Sex: male Date of Birth: [birth year] Admit Date: 01/09/2024 Admitting Physician Eva KATHEE Pore, DO ERE:[id number], Manuelita LABOR, MD  Brief Summary: Patient is a 49 y.o.  male with history of ESRD on HD-presented with abdominal pain/intractable nausea/vomiting.  Significant events: 1/1>> admit to [facility].  Significant studies: 12/31>> CT abdomen/pelvis: Cluster of dilated small bowel in the right mid abdomen with surrounding inflammatory stranding/mesenteric edema. 01/02>> x-ray of the abdomen: No acute findings 10/03>> CT angio abdomen/pelvis: No occlusion/hemodynamically significant stenosis-small bowel wall thickening suggesting enteritis without pneumatosis.  Significant microbiology data: None  Procedures: None  Consults: General surgery Renal GI  Subjective: Some intermittent abdominal pain continues-nausea better.  No vomiting.  Passing flatus  Objective: Vitals: Blood pressure (!) 144/94, pulse 78, temperature 98.2 F (36.8 C), temperature source Oral, resp. rate 20, height 5' 10 (1.778 m), weight 60.6 kg, SpO2 98%.   Exam: Abdomen: Soft-continues to have tenderness in the mid abdominal area without any peritoneal signs.  Pertinent Labs/Radiology:    Latest Ref Rng & Units 01/12/2024    6:23 AM 01/11/2024    5:03 AM 01/09/2024   10:39 PM  CBC  WBC 4.0 - 10.5 K/uL 3.6  3.0  3.3   Hemoglobin 13.0 - 17.0 g/dL 9.0  9.5  89.2   Hematocrit 39.0 - 52.0 % 27.2  29.2  33.4   Platelets 150 - 400 K/uL 121  121  149     Lab Results  Component Value Date   NA 134 (L) 01/14/2024   K 3.8 01/14/2024   CL 94 (L) 01/14/2024   CO2 26 01/14/2024      Assessment/Plan: Intractable nausea/vomiting Acute on chronic abdominal pain Unclear etiology-has had diffuse abdominal pain for months per history CT angio abdomen negative for any signs of chronic mesenteric ischemia Per GI-no  need for EGD-they think this is probably PSBO in the setting of internal hernia Continue PPI/Carafate  but continues to have pain-and with only minimal improvement Reevaluated by general surgery 1/5-diagnostic laparoscopy planned for 1/6.  ESRD on HD MWF Nephrology following  Microcytic anemia Chronic issue-probably some iron  deficiency and anemia of CKD contributing Defer Aranesp /iron  therapies to nephrology service  Hypokalemia Repleted-recheck periodically.  Prolonged QTc Resolved as of EKG on 1/4  Code status:   Code Status: Full Code   DVT Prophylaxis: heparin  injection 5,000 Units Start: 01/13/24 1400 SCDs Start: 01/10/24 2017   Family Communication: None at bedside   Disposition Plan: Status is: Inpatient Remains inpatient appropriate because: Severity of illness   Planned Discharge Destination:Home   Diet: Diet Order             Diet NPO time specified  Diet effective midnight                     Antimicrobial agents: Anti-infectives (From admission, onward)    Start     Dose/Rate Route Frequency Ordered Stop   01/15/24 0824  ceFAZolin  (ANCEF ) 1-4 GM/50ML-% IVPB       Note to Pharmacy: Virgil Ee: cabinet override      01/15/24 0824 01/15/24 0854        MEDICATIONS: Scheduled Meds:  [MAR Hold] Chlorhexidine  Gluconate Cloth  6 each Topical Q0600   [MAR Hold] heparin  injection (subcutaneous)  5,000 Units Subcutaneous Q8H   [MAR Hold] pantoprazole  (PROTONIX ) IV  40 mg Intravenous Q12H   [MAR Hold] sucralfate   1 g Oral TID WC & HS   Continuous Infusions:  sodium chloride  10 mL/hr at 01/15/24 0832   acetaminophen      [MAR Hold] promethazine  (PHENERGAN ) injection (IM or IVPB)     PRN Meds:.acetaminophen , [MAR Hold] acetaminophen  **OR** [MAR Hold] acetaminophen , [MAR Hold] hydrALAZINE , [MAR Hold]  HYDROmorphone  (DILAUDID ) injection, [MAR Hold] LORazepam , [MAR Hold] LORazepam , [MAR Hold] naLOXone  (NARCAN )  injection, [MAR Hold] ondansetron   (ZOFRAN ) IV, [MAR Hold] promethazine  (PHENERGAN ) injection (IM or IVPB)   I have personally reviewed following labs and imaging studies  LABORATORY DATA: CBC: Recent Labs  Lab 01/09/24 2239 01/11/24 0503 01/12/24 0623  WBC 3.3* 3.0* 3.6*  NEUTROABS 1.7 1.6*  --   HGB 10.7* 9.5* 9.0*  HCT 33.4* 29.2* 27.2*  MCV 77.1* 76.0* 74.5*  PLT 149* 121* 121*    Basic Metabolic Panel: Recent Labs  Lab 01/09/24 2239 01/11/24 0503 01/12/24 0623 01/13/24 1045 01/14/24 0303  NA 139 138 137 136 134*  K 2.7* 3.0* 2.9* 3.6 3.8  CL 94* 97* 96* 95* 94*  CO2 29 25 25 25 26   GLUCOSE 94 76 82 82 76  BUN 24* 36* 43* 37* 43*  CREATININE 8.34* 10.40* 12.60* 10.60* 12.10*  CALCIUM 9.6 8.8* 8.5* 8.8* 8.9  MG 1.9 2.0  --  1.9 1.9  PHOS  --  5.5* 5.0* 4.7* 5.6*    GFR: Estimated Creatinine Clearance: 6.5 mL/min (A) (by C-G formula based on SCr of 12.1 mg/dL (H)).  Liver Function Tests: Recent Labs  Lab 01/09/24 2239 01/11/24 0503 01/12/24 0623 01/13/24 1045 01/14/24 0303  AST 16 15  --   --   --   ALT 6 7  --   --   --   ALKPHOS 79 74  --   --   --   BILITOT 0.8 0.7  --   --   --   PROT 8.3* 7.3  --   --   --   ALBUMIN 4.1 3.8 3.5 3.8 3.6   Recent Labs  Lab 01/09/24 2239  LIPASE 153*   No results for input(s): AMMONIA in the last 168 hours.  Coagulation Profile: Recent Labs  Lab 01/11/24 1653  INR 1.0    Cardiac Enzymes: No results for input(s): CKTOTAL, CKMB, CKMBINDEX, TROPONINI in the last 168 hours.  BNP (last 3 results) No results for input(s): PROBNP in the last 8760 hours.  Lipid Profile: No results for input(s): CHOL, HDL, LDLCALC, TRIG, CHOLHDL, LDLDIRECT in the last 72 hours.  Thyroid Function Tests: No results for input(s): TSH, T4TOTAL, FREET4, T3FREE, THYROIDAB in the last 72 hours.  Anemia Panel: No results for input(s): VITAMINB12, FOLATE, FERRITIN, TIBC, IRON , RETICCTPCT in the last 72 hours.  Urine  analysis:    Component Value Date/Time   COLORURINE YELLOW 01/09/2024 1224   APPEARANCEUR TURBID (A) 01/09/2024 1224   LABSPEC 1.022 01/09/2024 1224   PHURINE 7.0 01/09/2024 1224   GLUCOSEU NEGATIVE 01/09/2024 1224   HGBUR SMALL (A) 01/09/2024 1224   BILIRUBINUR NEGATIVE 01/09/2024 1224   KETONESUR NEGATIVE 01/09/2024 1224   PROTEINUR >=300 (A) 01/09/2024 1224   NITRITE NEGATIVE 01/09/2024 1224   LEUKOCYTESUR SMALL (A) 01/09/2024 1224    Sepsis Labs: Lactic Acid, Venous    Component Value Date/Time   LATICACIDVEN 1.7 02/21/2022 2300    MICROBIOLOGY: Recent Results (from the past 240 hours)  Surgical  pcr screen     Status: None   Collection Time: 01/15/24  6:23 AM   Specimen: Nasal Mucosa; Nasal Swab  Result Value Ref Range Status   MRSA, PCR NEGATIVE NEGATIVE Final   Staphylococcus aureus NEGATIVE NEGATIVE Final    Comment: (NOTE) The Xpert SA Assay (FDA approved for NASAL specimens in patients 3 years of age and older), is one component of a comprehensive surveillance program. It is not intended to diagnose infection nor to guide or monitor treatment. Performed at [facility], [address], [place], [place] [address]     RADIOLOGY STUDIES/RESULTS: No results found.    LOS: 4 days   Donalda Applebaum, MD  [facility]    To contact the attending provider between 7A-7P or the covering provider during after hours 7P-7A, please log into the web site [email or web address] and access using universal [place] password for that web site. If you do not have the password, please call the hospital operator.  01/15/2024, 8:56 AM    "

## 2024-01-16 ENCOUNTER — Encounter (HOSPITAL_COMMUNITY): Payer: Self-pay | Admitting: General Surgery

## 2024-01-16 ENCOUNTER — Other Ambulatory Visit (HOSPITAL_COMMUNITY): Payer: Self-pay

## 2024-01-16 LAB — POCT I-STAT, CHEM 8
BUN: 51 mg/dL — ABNORMAL HIGH (ref 6–20)
Calcium, Ion: 0.98 mmol/L — ABNORMAL LOW (ref 1.15–1.40)
Chloride: 97 mmol/L — ABNORMAL LOW (ref 98–111)
Creatinine, Ser: 15.5 mg/dL — ABNORMAL HIGH (ref 0.61–1.24)
Glucose, Bld: 70 mg/dL (ref 70–99)
HCT: 32 % — ABNORMAL LOW (ref 39.0–52.0)
Hemoglobin: 10.9 g/dL — ABNORMAL LOW (ref 13.0–17.0)
Potassium: 3.9 mmol/L (ref 3.5–5.1)
Sodium: 134 mmol/L — ABNORMAL LOW (ref 135–145)
TCO2: 24 mmol/L (ref 22–32)

## 2024-01-16 LAB — BASIC METABOLIC PANEL WITH GFR
Anion gap: 15 (ref 5–15)
BUN: 29 mg/dL — ABNORMAL HIGH (ref 6–20)
CO2: 26 mmol/L (ref 22–32)
Calcium: 8.8 mg/dL — ABNORMAL LOW (ref 8.9–10.3)
Chloride: 92 mmol/L — ABNORMAL LOW (ref 98–111)
Creatinine, Ser: 8.51 mg/dL — ABNORMAL HIGH (ref 0.61–1.24)
GFR, Estimated: 7 mL/min — ABNORMAL LOW
Glucose, Bld: 88 mg/dL (ref 70–99)
Potassium: 3.8 mmol/L (ref 3.5–5.1)
Sodium: 133 mmol/L — ABNORMAL LOW (ref 135–145)

## 2024-01-16 LAB — CBC
HCT: 28.8 % — ABNORMAL LOW (ref 39.0–52.0)
Hemoglobin: 9.6 g/dL — ABNORMAL LOW (ref 13.0–17.0)
MCH: 24.6 pg — ABNORMAL LOW (ref 26.0–34.0)
MCHC: 33.3 g/dL (ref 30.0–36.0)
MCV: 73.7 fL — ABNORMAL LOW (ref 80.0–100.0)
Platelets: 132 K/uL — ABNORMAL LOW (ref 150–400)
RBC: 3.91 MIL/uL — ABNORMAL LOW (ref 4.22–5.81)
RDW: 16.4 % — ABNORMAL HIGH (ref 11.5–15.5)
WBC: 2.9 K/uL — ABNORMAL LOW (ref 4.0–10.5)
nRBC: 0 % (ref 0.0–0.2)

## 2024-01-16 MED ORDER — PANTOPRAZOLE SODIUM 40 MG PO TBEC: 40.0000 mg | DELAYED_RELEASE_TABLET | Freq: Two times a day (BID) | ORAL | Status: DC

## 2024-01-16 MED ORDER — OXYCODONE HCL 5 MG PO TABS
5.0000 mg | ORAL_TABLET | Freq: Four times a day (QID) | ORAL | 0 refills | Status: AC | PRN
  Filled 2024-01-16: qty 20, 5d supply, fill #0

## 2024-01-16 MED ORDER — PANTOPRAZOLE SODIUM 40 MG PO TBEC
40.0000 mg | DELAYED_RELEASE_TABLET | Freq: Two times a day (BID) | ORAL | 0 refills | Status: AC
  Filled 2024-01-16: qty 60, 30d supply, fill #0

## 2024-01-16 NOTE — Progress Notes (Signed)
 " [facility] Progress Note   Subjective:    Seen and examined patient in his room. Noted he became sick during yesterday's HD, thus, treatment was aborted. Noted minimal UF . He's going home today. He reports feeling well. Resume HD in outpatient.  Objective Vitals:   01/16/24 0357 01/16/24 0500 01/16/24 0700 01/16/24 1100  BP: 122/83  128/88 134/82  Pulse: 93     Resp:      Temp: 98.1 F (36.7 C)  98.1 F (36.7 C) 98.3 F (36.8 C)  TempSrc: Oral  Oral Oral  SpO2: 91%     Weight:  59.9 kg    Height:       Physical Exam General: Thin, but well appearing man, NAD. Room air Heart: RRR Lungs: CTAB Abdomen: soft, non-tender Extremities: no LE edema Dialysis Access:  R femoral [facility]  Filed Weights   01/15/24 1407 01/15/24 1745 01/16/24 0500  Weight: 61.1 kg 60.7 kg 59.9 kg    Intake/Output Summary (Last 24 hours) at 01/16/2024 1442 Last data filed at 01/15/2024 1745 Gross per 24 hour  Intake --  Output 400 ml  Net -400 ml    Additional Objective Labs: Basic Metabolic Panel: Recent Labs  Lab 01/13/24 1045 01/14/24 0303 01/15/24 0744 01/15/24 1123 01/16/24 0345  NA 136 134* 134* 134* 133*  K 3.6 3.8 3.9 4.1 3.8  CL 95* 94* 97* 93* 92*  CO2 25 26  --  24 26  GLUCOSE 82 76 70 84 88  BUN 37* 43* 51* 56* 29*  CREATININE 10.60* 12.10* 15.50* 14.20* 8.51*  CALCIUM 8.8* 8.9  --  8.6* 8.8*  PHOS 4.7* 5.6*  --  5.9*  --    Liver Function Tests: Recent Labs  Lab 01/09/24 2239 01/11/24 0503 01/12/24 0623 01/13/24 1045 01/14/24 0303 01/15/24 1123  AST 16 15  --   --   --   --   ALT 6 7  --   --   --   --   ALKPHOS 79 74  --   --   --   --   BILITOT 0.8 0.7  --   --   --   --   PROT 8.3* 7.3  --   --   --   --   ALBUMIN 4.1 3.8   < > 3.8 3.6 3.5   < > = values in this interval not displayed.   Recent Labs  Lab 01/09/24 2239  LIPASE 153*   CBC: Recent Labs  Lab 01/09/24 2239 01/11/24 0503 01/12/24 0623 01/15/24 0744 01/15/24 1123  01/16/24 0345  WBC 3.3* 3.0* 3.6*  --  4.3 2.9*  NEUTROABS 1.7 1.6*  --   --   --   --   HGB 10.7* 9.5* 9.0* 10.9* 9.2* 9.6*  HCT 33.4* 29.2* 27.2* 32.0* 28.1* 28.8*  MCV 77.1* 76.0* 74.5*  --  75.1* 73.7*  PLT 149* 121* 121*  --  129* 132*   Blood Culture    Component Value Date/Time   SDES TISSUE 07/06/2023 1245   SPECREQUEST LEFT ARM ANCEF  07/06/2023 1245   CULT  07/06/2023 1245    RARE STAPHYLOCOCCUS EPIDERMIDIS NO ANAEROBES ISOLATED; CULTURE IN PROGRESS FOR 5 DAYS    REPTSTATUS 07/11/2023 FINAL 07/06/2023 1245    Cardiac Enzymes: No results for input(s): CKTOTAL, CKMB, CKMBINDEX, TROPONINI in the last 168 hours. CBG: No results for input(s): GLUCAP in the last 168 hours. Iron  Studies: No results for input(s): IRON , TIBC, TRANSFERRIN, FERRITIN  in the last 72 hours. Lab Results  Component Value Date   INR 1.0 01/11/2024   Studies/Results: No results found.  Medications:  promethazine  (PHENERGAN ) injection (IM or IVPB)      Chlorhexidine  Gluconate Cloth  6 each Topical Q0600   heparin  injection (subcutaneous)  5,000 Units Subcutaneous Q8H   pantoprazole   40 mg Oral BID   sucralfate   1 g Oral TID WC & HS    Dialysis Orders: MWF - [place] 4:15hr, 400/800, EDW 68.6kg, 1K/2.5Ca bath, TDC, no heparin  - Last K 3.6 on 12/30, but previously high. Last HD 12/30 - left 63.8kg - Micera 200mcg IV q 2 weeks (last 12/28) - venofer 50mg  weekly (last 12/30) - Hectoral 5mcg IV q HD  Assessment/Plan: Abd pain: Hx SBO in past. CTA negative for mesenteric ischemia, ongoing nausea and pain. On short course sucralfate . Hypokalemia: Prev high K on home Lokelma  and 1K bath with HD - d/c Lokelma , s/p K here and used higher K bath with HD - repeat pending. Will need to change outpatient bath as well. ESRD: Continue HD on MWF schedule. Next HD in outpatient. HTN/volume: BP good, below prior EDW. Anemia of ESRD: Hgb 9 - not due for ESA yet Secondary HPTH: Ca/Phos ok -  hold sevelamer , likely needs alt binder given Hx SBO.  Nutrition: Alb decent, start prot supps once eating. SLE Dispo: Okay for discharge from a renal standpoint  Charmaine Piety, NP [facility] 01/16/2024,2:42 PM  LOS: 5 days    "

## 2024-01-16 NOTE — TOC Transition Note (Signed)
 Transition of Care [facility]) - Discharge Note   Patient Details  Name: Nathan Castaneda MRN: [id number] Date of Birth: 09/20/1975  [facility] CM/SW Contact:  Landry LABOR Senters, RN Phone Number: 01/16/2024, 12:34 PM   Clinical Narrative:    Patient will be discharging home today, will need transportation assistance to get home. TOC or d/c lounge to provide cab voucher.   No further needs identified by CM.    Final next level of care: Home/Self Care Barriers to Discharge: No Barriers Identified   Patient Goals and CMS Choice            Discharge Placement                       Discharge Plan and Services Additional resources added to the After Visit Summary for                                       Social Drivers of Health (SDOH) Interventions SDOH Screenings   Food Insecurity: No Food Insecurity (01/11/2024)  Housing: Low Risk (01/11/2024)  Transportation Needs: No Transportation Needs (01/11/2024)  Utilities: Not At Risk (01/11/2024)  Social Connections: Unknown (01/11/2024)  Tobacco Use: High Risk (01/15/2024)     Readmission Risk Interventions    08/28/2023    3:31 PM  Readmission Risk Prevention Plan  Transportation Screening Complete  PCP or Specialist Appt within 5-7 Days Complete  Home Care Screening Complete  Medication Review (RN CM) Referral to Pharmacy

## 2024-01-16 NOTE — Discharge Summary (Addendum)
 "  PATIENT DETAILS Name: Nathan Castaneda Age: 49 y.o. Sex: male Date of Birth: 1975-02-09 MRN: [id number]. Admitting Physician: Eva KATHEE Pore, DO ERE:[email or web address], Manuelita LABOR, MD  Admit Date: 01/09/2024 Discharge date: 01/16/2024  Recommendations for Outpatient Follow-up:  Follow up with PCP in 1-2 weeks Please obtain CMP/CBC in one week  Admitted From:  Home  Disposition: Home   Discharge Condition: good  CODE STATUS:   Code Status: Full Code   Diet recommendation:  Diet Order             Diet renal with fluid restriction           Diet renal with fluid restriction Fluid restriction: 1200 mL Fluid; Room service appropriate? Yes; Fluid consistency: Thin  Diet effective now                    Brief Summary: Patient is a 49 y.o.  male with history of ESRD on HD-presented with abdominal pain/intractable nausea/vomiting.   Significant events: 1/1>> admit to [facility].   Significant studies: 12/31>> CT abdomen/pelvis: Cluster of dilated small bowel in the right mid abdomen with surrounding inflammatory stranding/mesenteric edema. 01/02>> x-ray of the abdomen: No acute findings 10/03>> CT angio abdomen/pelvis: No occlusion/hemodynamically significant stenosis-small bowel wall thickening suggesting enteritis without pneumatosis.   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 Underwent extensive workup-see above-and then underwent diagnostic laparoscopy-on 1/6-found to have frozen abdomen-unfortunately-no surgical options at this point. Pain has thankfully improved-he is tolerating diet without any major issues Stable for discharge-with continued close outpatient follow-up with PCP/general surgery.   ESRD on HD MWF Nephrology followed closely.   Microcytic  anemia Chronic issue-probably some iron  deficiency and anemia of CKD contributing Defer Aranesp /iron  therapies to nephrology service   Hypokalemia Repleted-recheck periodically.   Prolonged QTc Resolved as of EKG on 1/4  Discharge Diagnoses:  Principal Problem:   Intractable nausea and vomiting Active Problems:   End-stage renal disease on hemodialysis (HCC)   Essential hypertension   Hypokalemia   Generalized abdominal pain   Pancytopenia (HCC)   History of anemia due to chronic kidney disease   Prolonged QT interval   Upper abdominal pain   Abnormal finding on GI tract imaging   Abnormal loss of weight   Discharge Instructions:  Activity:  As tolerated  Discharge Instructions     Diet renal with fluid restriction   Complete by: As directed    Discharge instructions   Complete by: As directed    Follow with Primary MD  Norine Manuelita LABOR, MD in 1-2 weeks  Please get a complete blood count and chemistry panel checked by your Primary MD at your next visit, and again as instructed by your Primary MD.  Get Medicines reviewed and adjusted: Please take all your medications with you for your next visit with your Primary MD  Laboratory/radiological data: Please request your Primary MD to go over all hospital tests and procedure/radiological results at the follow up, please ask your Primary MD to get all Hospital records sent to his/her office.  In some cases, they will be blood work, cultures and biopsy results pending at the time of your discharge. Please request that your primary care M.D. follows up on these results.  Also Note the following: If you experience worsening of your admission symptoms, develop shortness of breath,  life threatening emergency, suicidal or homicidal thoughts you must seek medical attention immediately by calling 911 or calling your MD immediately  if symptoms less severe.  You must read complete instructions/literature along with all the  possible adverse reactions/side effects for all the Medicines you take and that have been prescribed to you. Take any new Medicines after you have completely understood and accpet all the possible adverse reactions/side effects.   Do not drive when taking Pain medications or sleeping medications (Benzodaizepines)  Do not take more than prescribed Pain, Sleep and Anxiety Medications. It is not advisable to combine anxiety,sleep and pain medications without talking with your primary care practitioner  Special Instructions: If you have smoked or chewed Tobacco  in the last 2 yrs please stop smoking, stop any regular Alcohol  and or any Recreational drug use.  Wear Seat belts while driving.  Please note: You were cared for by a hospitalist during your hospital stay. Once you are discharged, your primary care physician will handle any further medical issues. Please note that NO REFILLS for any discharge medications will be authorized once you are discharged, as it is imperative that you return to your primary care physician (or establish a relationship with a primary care physician if you do not have one) for your post hospital discharge needs so that they can reassess your need for medications and monitor your lab values.   Increase activity slowly   Complete by: As directed    No wound care   Complete by: As directed       Allergies as of 01/16/2024       Reactions   Firvanq  [vancomycin ] Itching        Medication List     TAKE these medications    acetaminophen  500 MG tablet Commonly known as: TYLENOL  Take 1,000 mg by mouth 2 (two) times daily as needed for moderate pain (pain score 4-6), fever or headache.   ferric citrate  1 GM 210 MG(Fe) tablet Commonly known as: AURYXIA  Take 630 mg by mouth in the morning, at noon, and at bedtime.   Lokelma  10 g Pack packet Generic drug: sodium zirconium cyclosilicate  Take 10 g by mouth daily.   oxyCODONE  5 MG immediate release  tablet Commonly known as: Roxicodone  Take 1 tablet (5 mg total) by mouth every 6 (six) hours as needed for moderate pain (pain score 4-6).   pantoprazole  40 MG tablet Commonly known as: PROTONIX  Take 1 tablet (40 mg total) by mouth 2 (two) times daily before a meal. What changed: when to take this   sevelamer  carbonate 800 MG tablet Commonly known as: RENVELA  1,600 mg 3 (three) times daily with meals. And snacks        Follow-up Information     Norine Manuelita LABOR, MD. Schedule an appointment as soon as possible for a visit in 1 week(s).   Specialty: Nephrology Contact information: [address] [place] [address] [phone]         [facility]. Schedule an appointment as soon as possible for a visit in 1 week(s).   Contact information: [address] [place]  [phone]               Allergies[1]   Other Procedures/Studies: CT Angio Abd/Pel w/ and/or w/o Result Date: 01/12/2024 EXAM: CTA ABDOMEN AND PELVIS WITH AND WITHOUT CONTRAST 01/12/2024 04:46:20 PM TECHNIQUE: CTA images of the abdomen and pelvis without and with intravenous contrast. Three-dimensional MIP/volume rendered  formations were performed. Automated exposure control, iterative reconstruction, and/or weight based adjustment of the mA/kV was utilized to reduce the radiation dose to as low as reasonably achievable. CONTRAST: 75 mL of Omnipaque  350. COMPARISON: CT Abdomen Pelvis 01/09/2024. CLINICAL HISTORY: Mesenteric ischemia, chronic. FINDINGS: VASCULATURE: Multiple collateral vessels demonstrated in the right upper quadrant and right lateral chest wall and flank region. Right femoral central venous catheter is present with tip in the inferior vena cava. There appears to be a vascular graft in the left femoral region. No intraluminal contrast extravasation is identified. Mesenteric vessels appear patent. AORTA: Prominent calcification of the abdominal  aorta without aneurysm or dissection. CELIAC TRUNK: No acute finding. No occlusion or significant stenosis. SUPERIOR MESENTERIC ARTERY: No acute finding. No occlusion or significant stenosis. RENAL ARTERIES: No acute finding. No occlusion or significant stenosis. ILIAC ARTERIES: No acute finding. No occlusion or significant stenosis. LIVER: The liver is unremarkable. GALLBLADDER AND BILE DUCTS: Gallbladder is unremarkable. No biliary ductal dilatation. SPLEEN: The spleen is unremarkable. PANCREAS: The pancreas is unremarkable. ADRENAL GLANDS: Bilateral adrenal glands are unremarkable. KIDNEYS, URETERS AND BLADDER: Bilateral renal parenchymal atrophy with multiple parenchymal cysts bilaterally. Some of the cysts have thin calcified walls. Appearances are similar to the prior study. No imaging follow-up is indicated. No stones in the kidneys or ureters. No hydronephrosis. No perinephric or periureteral stranding. Urinary bladder is unremarkable. GI AND BOWEL: The stomach, small bowel, and colon are not abnormally distended. There is evidence of small bowel wall thickening suggesting enteritis. This could be infectious or inflammatory. Ischemic process is not excluded, but no pneumatosis is demonstrated that would provide evidence of ischemia. A cluster of mildly dilated small bowel loops in the right mid-abdomen is again demonstrated without change since the prior study, possibly indicating an internal hernia. No proximal obstruction. REPRODUCTIVE: Mild prostate enlargement. PERITONEUM AND RETROPERITONEUM: No ascites or free air. Retroperitoneal lymph nodes are moderately prominent, measuring up to 10 mm short axis dimension. Nonspecific but likely reactive. LUNG BASE: Mild scarring or linear atelectasis in the lung bases. Calcified pleural plaques on the right. LYMPH NODES: Retroperitoneal lymph nodes are moderately prominent, measuring up to 10 mm short axis dimension. Nonspecific but likely reactive. BONES AND SOFT  TISSUES: In the femoral head suggesting avascular necrosis. Increased bone mineral density may indicate renal osteodystrophy. No acute soft tissue abnormality. IMPRESSION: 1. No occlusion or hemodynamically significant stenosis of the arterial system of the abdomen or pelvis, with patent mesenteric vessels and no aortic dissection or abdominal aortic aneurysm. 2. Small bowel wall thickening suggesting enteritis, without pneumatosis. 3. Cluster of mildly dilated small bowel loops in the right mid-abdomen, possibly indicating an internal hernia, without proximal obstruction and unchanged since the prior study. Electronically signed by: Elsie Gravely MD 01/12/2024 08:27 PM EST RP Workstation: [id number]   DG Abd Portable 1V-Small Bowel Obstruction Protocol-initial, 8 hr delay Result Date: 01/11/2024 EXAM: 1 VIEW XRAY OF THE ABDOMEN 01/11/2024 02:00:25 AM COMPARISON: 05/16/2022 CLINICAL HISTORY: FINDINGS: LINES, TUBES AND DEVICES: Right femoral dialysis catheter in place. BOWEL: Gas-filled small bowel loops in left abdomen upper normal in caliber. Contrast material in the ascending colon. Nonobstructive bowel gas pattern. SOFT TISSUES: Aortoiliac calcified atherosclerosis. BONES: No acute fracture. IMPRESSION: 1. No acute findings. Electronically signed by: Franky Stanford MD 01/11/2024 02:23 AM EST RP Workstation: [id number]   CT ABDOMEN PELVIS WO CONTRAST Result Date: 01/09/2024 EXAM: CT ABDOMEN AND PELVIS WITHOUT CONTRAST 01/09/2024 10:48:00 PM TECHNIQUE: CT of the abdomen and pelvis was performed without the administration  of intravenous contrast. Multiplanar reformatted images are provided for review. Automated exposure control, iterative reconstruction, and/or weight-based adjustment of the mA/kV was utilized to reduce the radiation dose to as low as reasonably achievable. COMPARISON: CT abdomen and pelvis 08/24/2023. CLINICAL HISTORY: Abdominal pain, acute, nonlocalized. FINDINGS: LOWER CHEST: No acute  abnormality. LIVER: The liver is unremarkable. GALLBLADDER AND BILE DUCTS: Gallbladder is unremarkable. No biliary ductal dilatation. SPLEEN: No acute abnormality. PANCREAS: No acute abnormality. ADRENAL GLANDS: No acute abnormality. KIDNEYS, URETERS AND BLADDER: Bilateral renal atrophy is again noted. There are numerous cysts and cysts with calcifications similar to the prior study. The largest is in the right kidney measuring 3.1 cm. There are additional mildly hyperdense areas in both kidneys which appear similar to prior. There is no hydronephrosis. No perinephric or periureteral stranding. The bladder is decompressed. GI AND BOWEL: Stomach demonstrates no acute abnormality. The appendix is not definitively seen. There is a cluster of dilated small bowel in the right mid abdomen with surrounding inflammatory stranding and mesenteric edema. These bowel loops have air fluid levels. Otherwise, small bowel loops and colon are nondilated. There is no pneumatosis. PERITONEUM AND RETROPERITONEUM: No ascites. No free air. VASCULATURE: Atherosclerotic calcifications throughout the aorta. Right femoral venous catheter tip ends in the IVC. LYMPH NODES: No lymphadenopathy. REPRODUCTIVE ORGANS: No acute abnormality. 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 Bypass graft partially visualized in the anterior left thigh. No acute osseous abnormality. No focal soft tissue abnormality. IMPRESSION: 1. Cluster of dilated small bowel in the right mid abdomen with surrounding inflammatory stranding and mesenteric edema. This appears similar to the prior study and is most likely related to internal hernia. No bowel obstruction. 2. Bilateral renal atrophy with numerous renal cysts, some calcified, similar to the prior study. No hydronephrosis. Mildly hyperdense renal lesions are also stable. . Prominent Electronically signed by: Greig Pique MD 01/09/2024 11:31 PM EST RP  Workstation: [id number]   PERIPHERAL VASCULAR CATHETERIZATION Result Date: 12/27/2023 Images from the original result were not included. Patient name: Yogi Arther Birden MRN: [id number] DOB: [birth year] Sex: male 12/27/2023 Pre-operative Diagnosis: End-stage renal disease, dislodged right femoral tunneled dialysis catheter Post-operative diagnosis:  Same Surgeon:  Penne BROCKS. Sheree, MD Procedure Performed: 1.  Exchange right femoral tunneled dialysis catheter up to 33 cm palindrome 2.  IVC venogram Indications: 49 year old male with history of end-stage renal disease without further options for permanent access.  He has been dialyzing via right femoral catheter which has become dislodged.  He is now indicated for replacement. Findings: 33 cm was placed to the suprarenal IVC and IVC venogram demonstrated patency into the right atrium.  Procedure:  The patient was identified in the holding area and taken to heart and vascular procedure room.  The patient was then placed supine on the table and prepped and draped in the usual sterile fashion.  A time out was called.  The existing catheter was cut in half and a dilator was placed into the existing catheter followed by Glidewire under fluoroscopic guidance into the right atrium.  The existing catheter was then removed and pressure was held.  A new 33 cm catheter was then placed into the suprarenal IVC and central venogram demonstrated patency.  The catheter did flush and withdraw heparinized saline easily and was locked with 2.3 cc of concentrated heparin  both ports.  Catheter was then affixed to the skin after anesthetizing the skin with 1% lidocaine  and a sterile dressing  was applied.  He tolerated procedure without any complication. Contrast: 10cc Brandon C. Sheree, MD [facility] Office: [phone] Pager: [phone]     TODAY-DAY OF DISCHARGE:  Subjective:   Jhalen Lawrence today has no headache,no chest abdominal pain,no new  weakness tingling or numbness, feels much better wants to go home today.   Objective:   Blood pressure 134/82, pulse 93, temperature 98.3 F (36.8 C), temperature source Oral, resp. rate 18, height 5' 10 (1.778 m), weight 59.9 kg, SpO2 91%.  Intake/Output Summary (Last 24 hours) at 01/16/2024 1343 Last data filed at 01/15/2024 1745 Gross per 24 hour  Intake --  Output 400 ml  Net -400 ml   Filed Weights   01/15/24 1407 01/15/24 1745 01/16/24 0500  Weight: 61.1 kg 60.7 kg 59.9 kg    Exam: Awake Alert, Oriented *3, No new F.N deficits, Normal affect [place].AT,PERRAL Supple Neck,No JVD, No cervical lymphadenopathy appriciated.  Symmetrical Chest wall movement, Good air movement bilaterally, CTAB RRR,No Gallops,Rubs or new Murmurs, No Parasternal Heave +ve B.Sounds, Abd Soft, Non tender, No organomegaly appriciated, No rebound -guarding or rigidity. No Cyanosis, Clubbing or edema, No new Rash or bruise   PERTINENT RADIOLOGIC STUDIES: No results found.   PERTINENT LAB RESULTS: CBC: Recent Labs    01/15/24 1123 01/16/24 0345  WBC 4.3 2.9*  HGB 9.2* 9.6*  HCT 28.1* 28.8*  PLT 129* 132*   CMET CMP     Component Value Date/Time   NA 133 (L) 01/16/2024 0345   K 3.8 01/16/2024 0345   CL 92 (L) 01/16/2024 0345   CO2 26 01/16/2024 0345   GLUCOSE 88 01/16/2024 0345   BUN 29 (H) 01/16/2024 0345   CREATININE 8.51 (H) 01/16/2024 0345   CALCIUM 8.8 (L) 01/16/2024 0345   PROT 7.3 01/11/2024 0503   ALBUMIN 3.5 01/15/2024 1123   AST 15 01/11/2024 0503   ALT 7 01/11/2024 0503   ALKPHOS 74 01/11/2024 0503   BILITOT 0.7 01/11/2024 0503   GFRNONAA 7 (L) 01/16/2024 0345    GFR Estimated Creatinine Clearance: 9.1 mL/min (A) (by C-G formula based on SCr of 8.51 mg/dL (H)). No results for input(s): LIPASE, AMYLASE in the last 72 hours. No results for input(s): CKTOTAL, CKMB, CKMBINDEX, TROPONINI in the last 72 hours. Invalid input(s): POCBNP No results for input(s):  DDIMER in the last 72 hours. No results for input(s): HGBA1C in the last 72 hours. No results for input(s): CHOL, HDL, LDLCALC, TRIG, CHOLHDL, LDLDIRECT in the last 72 hours. No results for input(s): TSH, T4TOTAL, T3FREE, THYROIDAB in the last 72 hours.  Invalid input(s): FREET3 No results for input(s): VITAMINB12, FOLATE, FERRITIN, TIBC, IRON , RETICCTPCT in the last 72 hours. Coags: No results for input(s): INR in the last 72 hours.  Invalid input(s): PT Microbiology: Recent Results (from the past 240 hours)  Surgical pcr screen     Status: None   Collection Time: 01/15/24  6:23 AM   Specimen: Nasal Mucosa; Nasal Swab  Result Value Ref Range Status   MRSA, PCR NEGATIVE NEGATIVE Final   Staphylococcus aureus NEGATIVE NEGATIVE Final    Comment: (NOTE) The Xpert SA Assay (FDA approved for NASAL specimens in patients 71 years of age and older), is one component of a comprehensive surveillance program. It is not intended to diagnose infection nor to guide or monitor treatment. Performed at [facility], [address] [place], [place] [address]     FURTHER DISCHARGE INSTRUCTIONS:  Get Medicines reviewed  and adjusted: Please take all your medications with you for your next visit with your Primary MD  Laboratory/radiological data: Please request your Primary MD to go over all hospital tests and procedure/radiological results at the follow up, please ask your Primary MD to get all Hospital records sent to his/her office.  In some cases, they will be blood work, cultures and biopsy results pending at the time of your discharge. Please request that your primary care M.D. goes through all the records of your hospital data and follows up on these results.  Also Note the following: If you experience worsening of your admission symptoms, develop shortness of breath, life threatening emergency, suicidal or homicidal thoughts you must seek  medical attention immediately by calling 911 or calling your MD immediately  if symptoms less severe.  You must read complete instructions/literature along with all the possible adverse reactions/side effects for all the Medicines you take and that have been prescribed to you. Take any new Medicines after you have completely understood and accpet all the possible adverse reactions/side effects.   Do not drive when taking Pain medications or sleeping medications (Benzodaizepines)  Do not take more than prescribed Pain, Sleep and Anxiety Medications. It is not advisable to combine anxiety,sleep and pain medications without talking with your primary care practitioner  Special Instructions: If you have smoked or chewed Tobacco  in the last 2 yrs please stop smoking, stop any regular Alcohol  and or any Recreational drug use.  Wear Seat belts while driving.  Please note: You were cared for by a hospitalist during your hospital stay. Once you are discharged, your primary care physician will handle any further medical issues. Please note that NO REFILLS for any discharge medications will be authorized once you are discharged, as it is imperative that you return to your primary care physician (or establish a relationship with a primary care physician if you do not have one) for your post hospital discharge needs so that they can reassess your need for medications and monitor your lab values.  Total Time spent coordinating discharge including counseling, education and face to face time equals greater than 30 minutes.  Signed: Donalda Applebaum 01/16/2024 1:43 PM      [1]  Allergies Allergen Reactions   Firvanq  [Vancomycin ] Itching   "

## 2024-01-16 NOTE — Plan of Care (Signed)
  Problem: Education: Goal: Knowledge of General Education information will improve Description: Including pain rating scale, medication(s)/side effects and non-pharmacologic comfort measures Outcome: Progressing   Problem: Clinical Measurements: Goal: Ability to maintain clinical measurements within normal limits will improve Outcome: Progressing Goal: Diagnostic test results will improve Outcome: Progressing Goal: Cardiovascular complication will be avoided Outcome: Progressing   Problem: Nutrition: Goal: Adequate nutrition will be maintained Outcome: Progressing   Problem: Coping: Goal: Level of anxiety will decrease Outcome: Progressing

## 2024-01-16 NOTE — Progress Notes (Addendum)
 D/c orders noted. Contacted out-pt HD clinic [facility] and informed of pt d/c and anticipated arrival back to clinic on Friday. Pt did not receive HD on day of discharge. No further support is needed.     Lavanda Cherica Heiden Dialysis Navigator [phone]

## 2024-01-16 NOTE — Progress Notes (Signed)
 "  Progress Note  1 Day Post-Op  Subjective: AF and HDS. HR 70s. WBC 3 from 4. Hb stable.  On exam, patient is resting comfortably. NAD. Tolerating a diet post-op. Denies pain.     ROS  All negative with the exception of above.  Objective: Vital signs in last 24 hours: Temp:  [97.7 F (36.5 C)-98.5 F (36.9 C)] 98.1 F (36.7 C) (01/07 0700) Pulse Rate:  [74-99] 93 (01/07 0357) Resp:  [7-20] 18 (01/06 1959) BP: (122-170)/(83-102) 128/88 (01/07 0700) SpO2:  [91 %-100 %] 91 % (01/07 0357) Weight:  [59.9 kg-61.1 kg] 59.9 kg (01/07 0500) Last BM Date : 01/12/24  Intake/Output from previous day: 01/06 0701 - 01/07 0700 In: 150 [IV Piggyback:150] Out: 405 [Blood:5] Intake/Output this shift: No intake/output data recorded.  PE: General: Pleasant male who is laying in bed in NAD. HEENT: Head is normocephalic, atraumatic.  Heart: HR normal during encounter.  Lungs: Respiratory effort nonlabored. Abd: Soft, ND. Mild tenderness to palpation of central lower abdomen. +BS. No rebound tenderness or guarding.  MS: Able to move all 4 extremities.  Skin: Warm and dry.    Lab Results:  Recent Labs    01/15/24 1123 01/16/24 0345  WBC 4.3 2.9*  HGB 9.2* 9.6*  HCT 28.1* 28.8*  PLT 129* 132*   BMET Recent Labs    01/15/24 1123 01/16/24 0345  NA 134* 133*  K 4.1 3.8  CL 93* 92*  CO2 24 26  GLUCOSE 84 88  BUN 56* 29*  CREATININE 14.20* 8.51*  CALCIUM 8.6* 8.8*   PT/INR No results for input(s): LABPROT, INR in the last 72 hours.  CMP     Component Value Date/Time   NA 133 (L) 01/16/2024 0345   K 3.8 01/16/2024 0345   CL 92 (L) 01/16/2024 0345   CO2 26 01/16/2024 0345   GLUCOSE 88 01/16/2024 0345   BUN 29 (H) 01/16/2024 0345   CREATININE 8.51 (H) 01/16/2024 0345   CALCIUM 8.8 (L) 01/16/2024 0345   PROT 7.3 01/11/2024 0503   ALBUMIN 3.5 01/15/2024 1123   AST 15 01/11/2024 0503   ALT 7 01/11/2024 0503   ALKPHOS 74 01/11/2024 0503   BILITOT 0.7 01/11/2024  0503   GFRNONAA 7 (L) 01/16/2024 0345   GFRAA 3 (L) 09/13/2019 0429   Lipase     Component Value Date/Time   LIPASE 153 (H) 01/09/2024 2239       Studies/Results: No results found.   Anti-infectives: Anti-infectives (From admission, onward)    Start     Dose/Rate Route Frequency Ordered Stop   01/15/24 0824  ceFAZolin  (ANCEF ) 1-4 GM/50ML-% IVPB       Note to Pharmacy: Virgil Ee: cabinet override      01/15/24 0824 01/15/24 0854        Assessment/Plan Post prandial abdominal pain POD 1 from aborted dx lap d/t frozen abdomen -No evidence of internal hernia  -Continue renal diet - No plans for additional surgery. Patient can discharge when cleared by his medical team   FEN - Renal; IVF per primary service. VTE - Heparin  ID - None   ESRD Anemia HTN Lupus      LOS: 5 days   I reviewed hospitalist notes, specialist notes, consulting provider notes, nursing notes, last 24 h vitals and pain scores, last 48 h intake and output, last 24 h labs and trends, and last 24 h imaging results.  This care required moderate level of medical decision making.  Cordella DELENA Idler, MD  [facility] 01/16/2024, 8:01 AM Please see Amion for pager number during day hours 7:00am-4:30pm  "

## 2024-01-17 NOTE — Discharge Planning (Signed)
 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 No surgical options. F/u with PCP and general surgery Hypokalemia -repleted, see below on bath and medication adjustments  Aranesp : Given: No    Last Hgb: 9.6 PRBC's Given: No  ESA dose for discharge: Continue mircera 200 mcg IV q 2 weeks  IV Iron  dose at discharge: Continue weekly Venofer  Heparin  change: No  EDW Change: Yes New EDW: Lower to 60.5kg. Weights were down here. Notify renal team for any weight discrepancies.  Bath Change: Yes, change to 2K bath for now!!!!. Continue 2.5Ca bath  Access intervention/Change: No  Hectorol  change: No  Discharge Labs: Calcium 8.8 Phosphorus 5.9 Albumin 3.5  K+ 3.8  IV Antibiotics: No  On Coumadin?: No   OTHER/APPTS/LAB ORDERS:  We need to advise patient to stop home Lokelma  for now. I changed to a 2K bath 2nd hypokalemia here Please check K+ levels weekly X 3 to ensure trend stability. Thank you    D/C Meds to be reconciled by nurse after every discharge.  Completed By: Charmaine Piety, NP   Reviewed by: MD:______ RN_______

## 2024-01-19 LAB — TYPE AND SCREEN
ABO/RH(D): B POS
Antibody Screen: POSITIVE
DAT, IgG: NEGATIVE
Unit division: 0
Unit division: 0
Unit division: 0
Unit division: 0

## 2024-01-19 LAB — BPAM RBC
Blood Product Expiration Date: 202601122359
Blood Product Expiration Date: 202601212359
Blood Product Expiration Date: 202601222359
Blood Product Expiration Date: 202601262359
Unit Type and Rh: 5100
Unit Type and Rh: 5100
Unit Type and Rh: 5100
Unit Type and Rh: 9500
